# Patient Record
Sex: Female | Born: 1945 | Hispanic: Yes | Marital: Married | Smoking: Never smoker
Health system: Southern US, Community
[De-identification: ages and names within clinical notes are randomized; demographics above are authoritative.]

## PROBLEM LIST (undated history)

## (undated) DIAGNOSIS — C8330 Diffuse large B-cell lymphoma, unspecified site: Secondary | ICD-10-CM

## (undated) DIAGNOSIS — M069 Rheumatoid arthritis, unspecified: Secondary | ICD-10-CM

## (undated) DIAGNOSIS — K219 Gastro-esophageal reflux disease without esophagitis: Secondary | ICD-10-CM

## (undated) DIAGNOSIS — G4700 Insomnia, unspecified: Secondary | ICD-10-CM

## (undated) DIAGNOSIS — M419 Scoliosis, unspecified: Secondary | ICD-10-CM

## (undated) DIAGNOSIS — K279 Peptic ulcer, site unspecified, unspecified as acute or chronic, without hemorrhage or perforation: Secondary | ICD-10-CM

## (undated) DIAGNOSIS — R519 Headache, unspecified: Secondary | ICD-10-CM

## (undated) DIAGNOSIS — I10 Essential (primary) hypertension: Secondary | ICD-10-CM

## (undated) DIAGNOSIS — F419 Anxiety disorder, unspecified: Secondary | ICD-10-CM

## (undated) DIAGNOSIS — G8929 Other chronic pain: Secondary | ICD-10-CM

## (undated) DIAGNOSIS — C439 Malignant melanoma of skin, unspecified: Secondary | ICD-10-CM

## (undated) DIAGNOSIS — M545 Low back pain, unspecified: Secondary | ICD-10-CM

## (undated) DIAGNOSIS — E7800 Pure hypercholesterolemia, unspecified: Secondary | ICD-10-CM

## (undated) DIAGNOSIS — D649 Anemia, unspecified: Secondary | ICD-10-CM

## (undated) DIAGNOSIS — J45909 Unspecified asthma, uncomplicated: Secondary | ICD-10-CM

## (undated) DIAGNOSIS — R51 Headache: Secondary | ICD-10-CM

## (undated) HISTORY — PX: INGUINAL LYMPH NODE BIOPSY: SHX5865

## (undated) HISTORY — PX: AXILLARY LYMPH NODE BIOPSY: SHX5737

## (undated) HISTORY — PX: LYMPH NODE BIOPSY: SHX201

## (undated) HISTORY — PX: TOTAL KNEE ARTHROPLASTY: SHX125

## (undated) HISTORY — PX: APPENDECTOMY: SHX54

## (undated) HISTORY — PX: TOTAL ABDOMINAL HYSTERECTOMY: SHX209

## (undated) HISTORY — PX: JOINT REPLACEMENT: SHX530

---

## 2011-11-23 DIAGNOSIS — C439 Malignant melanoma of skin, unspecified: Secondary | ICD-10-CM

## 2011-11-23 HISTORY — PX: MELANOMA EXCISION: SHX5266

## 2011-11-23 HISTORY — DX: Malignant melanoma of skin, unspecified: C43.9

## 2015-01-17 ENCOUNTER — Other Ambulatory Visit: Payer: Self-pay | Admitting: Internal Medicine

## 2015-01-17 ENCOUNTER — Ambulatory Visit
Admission: RE | Admit: 2015-01-17 | Discharge: 2015-01-17 | Disposition: A | Discharge status: Home / Self Care | Payer: No Typology Code available for payment source | Source: Ambulatory Visit | Attending: Internal Medicine | Admitting: Internal Medicine

## 2015-01-17 DIAGNOSIS — R05 Cough: Secondary | ICD-10-CM

## 2015-01-17 DIAGNOSIS — R059 Cough, unspecified: Secondary | ICD-10-CM

## 2015-01-18 ENCOUNTER — Emergency Department (HOSPITAL_COMMUNITY)
Admission: EM | Admit: 2015-01-18 | Discharge: 2015-01-19 | Disposition: A | Discharge status: Home / Self Care | Payer: Medicare (Managed Care) | Attending: Emergency Medicine | Admitting: Emergency Medicine

## 2015-01-18 ENCOUNTER — Encounter (HOSPITAL_COMMUNITY): Payer: Self-pay | Admitting: Emergency Medicine

## 2015-01-18 DIAGNOSIS — M069 Rheumatoid arthritis, unspecified: Secondary | ICD-10-CM | POA: Diagnosis not present

## 2015-01-18 DIAGNOSIS — Z85820 Personal history of malignant melanoma of skin: Secondary | ICD-10-CM | POA: Diagnosis not present

## 2015-01-18 DIAGNOSIS — I10 Essential (primary) hypertension: Secondary | ICD-10-CM | POA: Diagnosis not present

## 2015-01-18 DIAGNOSIS — Z8669 Personal history of other diseases of the nervous system and sense organs: Secondary | ICD-10-CM | POA: Insufficient documentation

## 2015-01-18 DIAGNOSIS — R05 Cough: Secondary | ICD-10-CM | POA: Diagnosis present

## 2015-01-18 DIAGNOSIS — K219 Gastro-esophageal reflux disease without esophagitis: Secondary | ICD-10-CM | POA: Insufficient documentation

## 2015-01-18 DIAGNOSIS — Z79899 Other long term (current) drug therapy: Secondary | ICD-10-CM | POA: Diagnosis not present

## 2015-01-18 DIAGNOSIS — Z7952 Long term (current) use of systemic steroids: Secondary | ICD-10-CM | POA: Insufficient documentation

## 2015-01-18 DIAGNOSIS — E7800 Pure hypercholesterolemia, unspecified: Secondary | ICD-10-CM | POA: Insufficient documentation

## 2015-01-18 DIAGNOSIS — J45901 Unspecified asthma with (acute) exacerbation: Secondary | ICD-10-CM | POA: Diagnosis not present

## 2015-01-18 DIAGNOSIS — R059 Cough, unspecified: Secondary | ICD-10-CM

## 2015-01-18 HISTORY — DX: Gastro-esophageal reflux disease without esophagitis: K21.9

## 2015-01-18 HISTORY — DX: Essential (primary) hypertension: I10

## 2015-01-18 HISTORY — DX: Malignant melanoma of skin, unspecified: C43.9

## 2015-01-18 HISTORY — DX: Scoliosis, unspecified: M41.9

## 2015-01-18 HISTORY — DX: Rheumatoid arthritis, unspecified: M06.9

## 2015-01-18 HISTORY — DX: Pure hypercholesterolemia, unspecified: E78.00

## 2015-01-18 HISTORY — DX: Insomnia, unspecified: G47.00

## 2015-01-18 HISTORY — DX: Unspecified asthma, uncomplicated: J45.909

## 2015-01-18 LAB — I-STAT CHEM 8, ED
BUN: 20 mg/dL (ref 6–20)
CHLORIDE: 104 mmol/L (ref 101–111)
CREATININE: 0.6 mg/dL (ref 0.44–1.00)
Calcium, Ion: 1.33 mmol/L — ABNORMAL HIGH (ref 1.13–1.30)
GLUCOSE: 90 mg/dL (ref 65–99)
HCT: 42 % (ref 36.0–46.0)
HEMOGLOBIN: 14.3 g/dL (ref 12.0–15.0)
Potassium: 4.1 mmol/L (ref 3.5–5.1)
Sodium: 141 mmol/L (ref 135–145)
TCO2: 26 mmol/L (ref 0–100)

## 2015-01-18 MED ORDER — HYDROCOD POLST-CPM POLST ER 10-8 MG/5ML PO SUER
5.0000 mL | Freq: Two times a day (BID) | ORAL | Status: DC | PRN
Start: 2015-01-18 — End: 2015-01-24

## 2015-01-18 MED ORDER — GUAIFENESIN-CODEINE 100-10 MG/5ML PO SOLN: 10.0000 mL | Freq: Once | ORAL | Status: DC

## 2015-01-18 MED ORDER — IPRATROPIUM-ALBUTEROL 0.5-2.5 (3) MG/3ML IN SOLN
3.0000 mL | Freq: Once | RESPIRATORY_TRACT | Status: AC
  Administered 2015-01-18: 3 mL via RESPIRATORY_TRACT
  Filled 2015-01-18: qty 3

## 2015-01-18 MED ORDER — DIPHENHYDRAMINE HCL 50 MG/ML IJ SOLN: 25.0000 mg | Freq: Once | INTRAMUSCULAR | Status: DC

## 2015-01-18 NOTE — ED Notes (Signed)
MD Zammitt At bedside.

## 2015-01-18 NOTE — Discharge Instructions (Signed)
Follow up to get your ct tomorrow

## 2015-01-18 NOTE — ED Notes (Signed)
Pt and family state that pt has had dry cough x 3 days. Had a chest x-ray yesterday and is scheduled for a CT scan but has been coughing so much and 'miserable' that she had to come in. Alert and oriented.

## 2015-01-19 ENCOUNTER — Other Ambulatory Visit: Payer: Self-pay

## 2015-01-19 ENCOUNTER — Other Ambulatory Visit: Payer: Self-pay | Admitting: Family Medicine

## 2015-01-19 ENCOUNTER — Ambulatory Visit
Admission: RE | Admit: 2015-01-19 | Discharge: 2015-01-19 | Disposition: A | Discharge status: Home / Self Care | Payer: No Typology Code available for payment source | Source: Ambulatory Visit | Attending: Family Medicine | Admitting: Family Medicine

## 2015-01-19 DIAGNOSIS — R222 Localized swelling, mass and lump, trunk: Secondary | ICD-10-CM

## 2015-01-19 DIAGNOSIS — R05 Cough: Secondary | ICD-10-CM | POA: Diagnosis not present

## 2015-01-19 MED ORDER — IOPAMIDOL (ISOVUE-300) INJECTION 61%
75.0000 mL | Freq: Once | INTRAVENOUS | Status: AC | PRN
  Administered 2015-01-19: 75 mL via INTRAVENOUS

## 2015-01-19 NOTE — ED Notes (Signed)
Pt and family upset about not getting answers and ED MD would not discuss results of prior CXR with patient or family. They were advised to follow up with the CT tomorrow. They were wheeled to the lobby by EMT Candice.

## 2015-01-22 NOTE — ED Provider Notes (Signed)
CSN: [id number]     Arrival date & time 01/18/15  1953 History   First MD Initiated Contact with Patient 01/18/15 2048     Chief Complaint  Patient presents with  . Cough     (Consider location/radiation/quality/duration/timing/severity/associated sxs/prior Treatment) Patient is a 69 y.o. female presenting with cough. The history is provided by the patient (The patient states that she continues to cough. She saw her family doctor who did an x-ray did not see pneumonia. He has arranged for her to get a CT scan of her chest to evaluate possible mass. Patient has no fever but continues to cough).  Cough Cough characteristics:  Non-productive Severity:  Moderate Timing:  Constant Progression:  Unable to specify Chronicity:  Recurrent Context: not animal exposure   Relieved by:  Nothing Associated symptoms: no chest pain, no eye discharge, no headaches and no rash     Past Medical History  Diagnosis Date  . Asthma   . Scoliosis   . High cholesterol   . Rheumatoid arthritis ([place])   . Hypertension   . Melanoma ([place])   . Acid reflux   . Insomnia    Past Surgical History  Procedure Laterality Date  . Abdominal hysterectomy    . Appendectomy    . Total knee arthroplasty Bilateral    No family history on file. Social History  Substance Use Topics  . Smoking status: Never Smoker   . Smokeless tobacco: Not on file  . Alcohol Use: No   OB History    No data available     Review of Systems  Constitutional: Negative for appetite change and fatigue.  HENT: Negative for congestion, ear discharge and sinus pressure.   Eyes: Negative for discharge.  Respiratory: Positive for cough.   Cardiovascular: Negative for chest pain.  Gastrointestinal: Negative for abdominal pain and diarrhea.  Genitourinary: Negative for frequency and hematuria.  Musculoskeletal: Negative for back pain.  Skin: Negative for rash.  Neurological: Negative for seizures and headaches.   Psychiatric/Behavioral: Negative for hallucinations.      Allergies  Morphine and related and Norco  Home Medications   Prior to Admission medications   Medication Sig Start Date End Date Taking? Authorizing Provider  acetaminophen (TYLENOL) 325 MG tablet Take 650 mg by mouth every 6 (six) hours as needed for moderate pain.   Yes Historical Provider, MD  alendronate (FOSAMAX) 70 MG tablet Take 70 mg by mouth once a week. Take with a full glass of water on an empty stomach. Take along with methotrexate.   Yes Historical Provider, MD  folic acid (FOLVITE) 1 MG tablet Take 1 mg by mouth daily.   Yes Historical Provider, MD  methotrexate (RHEUMATREX) 2.5 MG tablet Take 15 mg by mouth once a week. Caution:Chemotherapy. Protect from light. Take 6 tablets weekly.   Yes Historical Provider, MD  metoprolol (LOPRESSOR) 50 MG tablet Take 50 mg by mouth 2 (two) times daily.   Yes Historical Provider, MD  predniSONE (DELTASONE) 10 MG tablet Take 10 mg by mouth daily with breakfast.   Yes Historical Provider, MD  ranitidine (ZANTAC) 150 MG tablet Take 150 mg by mouth 2 (two) times daily.   Yes Historical Provider, MD  simvastatin (ZOCOR) 20 MG tablet Take 20 mg by mouth daily.   Yes Historical Provider, MD  Vitamin D, Ergocalciferol, (DRISDOL) 50000 UNITS CAPS capsule Take 50,000 Units by mouth every 30 (thirty) days.   Yes Historical Provider, MD  chlorpheniramine-HYDROcodone Amanda Cockayne PENNKINETIC ER) 10-8 MG/5ML Latanya Presser  Take 5 mLs by mouth every 12 (twelve) hours as needed for cough. 01/18/15   Milton Ferguson, MD   BP 159/75 mmHg  Pulse 66  Temp(Src) 98.5 F (36.9 C) (Oral)  Resp 16  SpO2 95% Physical Exam  Constitutional: She is oriented to person, place, and time. She appears well-developed.  HENT:  Head: Normocephalic.  Eyes: Conjunctivae and EOM are normal. No scleral icterus.  Neck: Neck supple. No thyromegaly present.  Cardiovascular: Normal rate and regular rhythm.  Exam reveals no  gallop and no friction rub.   No murmur heard. Pulmonary/Chest: No stridor. She has wheezes. She has no rales. She exhibits no tenderness.  Abdominal: She exhibits no distension. There is no tenderness. There is no rebound.  Musculoskeletal: Normal range of motion. She exhibits no edema.  Lymphadenopathy:    She has no cervical adenopathy.  Neurological: She is oriented to person, place, and time. She exhibits normal muscle tone. Coordination normal.  Skin: No rash noted. No erythema.  Psychiatric: She has a normal mood and affect. Her behavior is normal.    ED Course  Procedures (including critical care time) Labs Review Labs Reviewed  I-STAT CHEM 8, ED - Abnormal; Notable for the following:    Calcium, Ion 1.33 (*)    All other components within normal limits    Imaging Review No results found. I have personally reviewed and evaluated these images and lab results as part of my medical decision-making.   EKG Interpretation   Date/Time:  Thursday January 18 2015 20:21:11 EDT Ventricular Rate:  61 PR Interval:  136 QRS Duration: 75 QT Interval:  438 QTC Calculation: 441 R Axis:   20 Text Interpretation:  Sinus rhythm Low voltage, extremity and precordial  leads Abnormal T, consider ischemia, diffuse leads ED PHYSICIAN  INTERPRETATION AVAILABLE IN [facility] Confirmed by TEST, Record  ([id number]) on 01/19/2015 6:54:06 AM      MDM   Final diagnoses:  Cough    Cough probably secondary to possible metastases in chest. Patient improved with Tussionex. We'll give prescription for Tussionex. Patient is scheduled to have a CT scan in a few days and will follow up with her PCP    Milton Ferguson, MD 01/22/15 [phone]

## 2015-01-24 ENCOUNTER — Encounter: Payer: Self-pay | Admitting: Cardiothoracic Surgery

## 2015-01-24 ENCOUNTER — Institutional Professional Consult (permissible substitution) (INDEPENDENT_AMBULATORY_CARE_PROVIDER_SITE_OTHER): Payer: Medicare (Managed Care) | Admitting: Cardiothoracic Surgery

## 2015-01-24 ENCOUNTER — Other Ambulatory Visit: Payer: Self-pay | Admitting: *Deleted

## 2015-01-24 VITALS — BP 150/81 | HR 90 | Resp 20 | Ht 64.0 in | Wt 215.0 lb

## 2015-01-24 DIAGNOSIS — I313 Pericardial effusion (noninflammatory): Secondary | ICD-10-CM

## 2015-01-24 DIAGNOSIS — J9859 Other diseases of mediastinum, not elsewhere classified: Secondary | ICD-10-CM

## 2015-01-24 DIAGNOSIS — R918 Other nonspecific abnormal finding of lung field: Secondary | ICD-10-CM

## 2015-01-24 DIAGNOSIS — I3139 Other pericardial effusion (noninflammatory): Secondary | ICD-10-CM

## 2015-01-24 NOTE — Progress Notes (Signed)
PCP is Angelica Pou, MD Referring Provider is Angelica Pou, MD  Chief Complaint  Patient presents with  . Mediastinal Mass    eval for bx..CT CHEST 01/19/15  patient examined and CT scan of chest personally reviewed  HPI: 69 year old obese Filipino female presents with cough, orthopnea and a 12 cm left anterior mediastinal mass on CT scan. Symptoms started approximately 2 weeks ago. Past history significant for melanoma resected from her left forearm and 2013. She did not receive any chemotherapy but it had a lymph node biopsy. In 2014 she had bilateral groin and right neck lymph node biopsy was which were negative-is apparently there is a concern for lymphoma. All her previous surgery was done in [place].  CT scan performed recently shows a large mediastinal mass with compression of the innominate vein and a small-moderate pericardial effusion. There is a small 5 mm pulmonary nodule in the right lower lung field.  The patient has steroid and methotrexate dependent rheumatoid arthritis. She has had bilateral total knee replacements. She walks poorly and uses a walker.   She's had some exposure to smoking the past but remote. She denies any productive cough hemoptysis chest pain. She does have orthopnea. She is past history scoliosis but without surgery. Her last hemoglobin was 12.5 white count 7.2 platelet count 252.   Past Medical History  Diagnosis Date  . Asthma   . Scoliosis   . High cholesterol   . Rheumatoid arthritis ([place])   . Hypertension   . Acid reflux   . Insomnia   . Melanoma [facility])     Past Surgical History  Procedure Laterality Date  . Abdominal hysterectomy    . Appendectomy    . Total knee arthroplasty Bilateral     Family History  Problem Relation Age of Onset  . Cancer Sister 81    BREAST    Social History Social History  Substance Use Topics  . Smoking status: Never Smoker   . Smokeless tobacco: None  . Alcohol Use: No     Current Outpatient Prescriptions  Medication Sig Dispense Refill  . acetaminophen (TYLENOL) 325 MG tablet Take 650 mg by mouth every 6 (six) hours as needed for moderate pain.    Marland Kitchen albuterol (PROVENTIL) (5 MG/ML) 0.5% nebulizer solution Take 2.5 mg by nebulization every 6 (six) hours as needed for wheezing or shortness of breath.    Marland Kitchen alendronate (FOSAMAX) 70 MG tablet Take 70 mg by mouth once a week. Take with a full glass of water on an empty stomach. Take along with methotrexate.    . folic acid (FOLVITE) 1 MG tablet Take 1 mg by mouth daily.    . methotrexate (RHEUMATREX) 2.5 MG tablet Take 15 mg by mouth once a week. Caution:Chemotherapy. Protect from light. Take 6 tablets weekly.    . metoprolol (LOPRESSOR) 50 MG tablet Take 50 mg by mouth 2 (two) times daily.    . predniSONE (DELTASONE) 10 MG tablet Take 10 mg by mouth daily with breakfast.    . ranitidine (ZANTAC) 150 MG tablet Take 150 mg by mouth 2 (two) times daily.    . simvastatin (ZOCOR) 20 MG tablet Take 20 mg by mouth daily.    . Vitamin D, Ergocalciferol, (DRISDOL) 50000 UNITS CAPS capsule Take 50,000 Units by mouth every 30 (thirty) days.    . benzonatate (TESSALON) 100 MG capsule TAKE ONE CAPSULE BY MOUTH 3 TIMES A DAY (NOT COVERED BY INSURANCE)  1   No current facility-administered  medications for this visit.    Allergies  Allergen Reactions  . Morphine And Related Hives and Itching  . Norco [Hydrocodone-Acetaminophen] Hives and Itching    Review of Systems         Review of Systems :  [ y ] = yes, [  ] = no        General :  Weight gain [  yes ]    Weight loss  [   ]  Fatigue [place].Blacker  ]  Fever [no  ]  Chills  [ no ]  No night sweats                              Weakness yes [  yes]           Cardiac :  Chest pain/ pressure [place].Blacker  ]  Resting SOB [  ] exertional SOB [place].Blacker  ]                        Orthopnea [  ]  Pedal edema  [  ]  Palpitations [  ] Syncope/presyncope [ ]                         Paroxysmal  nocturnal dyspnea [  ]        Pulmonary : cough [ yes ]  wheezing [place].Blacker  ]  Hemoptysis [  ] Sputum [  ] Snoring [  ]                              Pneumothorax [  ]  Sleep apnea [  ]       GI : Vomiting [  ]  Dysphagia [no  ]  Melena  [  ]  Abdominal pain [  ] BRBPR [  ]              Heart burn [  ]  Constipation [  ] Diarrhea  [  ] Colonoscopy [  ]       GU : Hematuria [  ]  Dysuria [  ]  Nocturia [  ] UTI's [  ]       Vascular : Claudication [  ]  Rest pain [  ]  DVT [  ] Vein stripping [  ] leg ulcers [  ]                          TIA [  ] Stroke [  ]  Varicose veins [  ]       NEURO :  Headaches  [  ] Seizures [  ] Vision changes [  ] Paresthesias [  ]       Musculoskeletal :  Arthritis [  ] Gout  [  ]  Back pain [ yes ]  Joint pain [place].Blacker  ]       Skin :  Rash [  ]  Melanoma [  ]        Heme : Bleeding problems [  ]Clotting Disorders [  ] Anemia [  ]Blood Transfusion [ ]        Endocrine : Diabetes [no  ] Thyroid Disorder  [  ]       Psych : Depression [  ]  Anxiety [ yes ]  Psych hospitalizations [  ]         Right-hand dominant    Negative for history previous thoracic trauma or pneumonia                                      BP 150/81 mmHg  Pulse 90  Resp 20  Ht 5\' 4"  (1.626 m)  Wt 215 lb (97.523 kg)  BMI 36.89 kg/m2  SpO2 96% Physical Exam      Physical Exam  General: Obese Asian female with cough and some difficulty breathing because of coughing but with normal room air saturation HEENT: Normocephalic pupils equal , dentition adequate Neck: Supple without JVD, adenopathy, or bruit Chest: Clear to auscultation, symmetrical breath sounds, no rhonchi, no tenderness             or deformity Cardiovascular: Regular rate and rhythm, no murmur,no rub, no gallop, peripheral pulses             palpable in all extremities Abdomen:  Soft, obese, nontender, no palpable mass or organomegaly Extremities: Warm, well-perfused, no clubbing cyanosis edema or tenderness,              no  venous stasis changes of the legs Rectal/GU: Deferred Neuro: Grossly non--focal and symmetrical throughout Skin: Clean and dry without rash or ulceration   Diagnostic Tests: CT scan personally reviewed, counseled with patient and family  Impression: Large mediastinal tumor, 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 This appears non-resectable. She is a very poor candidate for general anesthesia because of her respiratory symptoms, obesity, and poor mobility. The mass does not appear resectable.Will plan on interventional radiology for needle biopsy of this large mediastinal mass then referral to oncology for possible radiation/chemotherapy  Mild-moderate pericardial effusion noted on CT scan  Plan:proceed with IR CT guided core needle biopsy of a large mediastinal mass           2-D echocardiogram to assess pericardial effusion noted on CT scan   Len Childs, MD [facility] [phone]

## 2015-01-25 ENCOUNTER — Inpatient Hospital Stay (HOSPITAL_COMMUNITY): Payer: Medicare (Managed Care)

## 2015-01-25 ENCOUNTER — Other Ambulatory Visit: Payer: Self-pay | Admitting: *Deleted

## 2015-01-25 ENCOUNTER — Ambulatory Visit (HOSPITAL_BASED_OUTPATIENT_CLINIC_OR_DEPARTMENT_OTHER)
Admission: RE | Admit: 2015-01-25 | Discharge: 2015-01-25 | Disposition: A | Discharge status: Home / Self Care | Payer: Medicare (Managed Care) | Source: Ambulatory Visit | Attending: Cardiothoracic Surgery | Admitting: Cardiothoracic Surgery

## 2015-01-25 ENCOUNTER — Encounter (HOSPITAL_COMMUNITY): Payer: Self-pay | Admitting: General Practice

## 2015-01-25 ENCOUNTER — Inpatient Hospital Stay (HOSPITAL_COMMUNITY)
Admission: AD | Admit: 2015-01-25 | Discharge: 2015-02-22 | DRG: 003 | Disposition: E | Payer: Medicare (Managed Care) | Source: Ambulatory Visit | Attending: Internal Medicine | Admitting: Internal Medicine

## 2015-01-25 DIAGNOSIS — J90 Pleural effusion, not elsewhere classified: Secondary | ICD-10-CM

## 2015-01-25 DIAGNOSIS — I3139 Other pericardial effusion (noninflammatory): Secondary | ICD-10-CM

## 2015-01-25 DIAGNOSIS — F419 Anxiety disorder, unspecified: Secondary | ICD-10-CM | POA: Diagnosis present

## 2015-01-25 DIAGNOSIS — I314 Cardiac tamponade: Secondary | ICD-10-CM | POA: Diagnosis present

## 2015-01-25 DIAGNOSIS — Z8711 Personal history of peptic ulcer disease: Secondary | ICD-10-CM | POA: Diagnosis not present

## 2015-01-25 DIAGNOSIS — J95851 Ventilator associated pneumonia: Secondary | ICD-10-CM | POA: Diagnosis present

## 2015-01-25 DIAGNOSIS — R609 Edema, unspecified: Secondary | ICD-10-CM

## 2015-01-25 DIAGNOSIS — Y95 Nosocomial condition: Secondary | ICD-10-CM | POA: Diagnosis present

## 2015-01-25 DIAGNOSIS — G4730 Sleep apnea, unspecified: Secondary | ICD-10-CM | POA: Diagnosis present

## 2015-01-25 DIAGNOSIS — E871 Hypo-osmolality and hyponatremia: Secondary | ICD-10-CM | POA: Diagnosis not present

## 2015-01-25 DIAGNOSIS — J9690 Respiratory failure, unspecified, unspecified whether with hypoxia or hypercapnia: Secondary | ICD-10-CM | POA: Diagnosis not present

## 2015-01-25 DIAGNOSIS — Z66 Do not resuscitate: Secondary | ICD-10-CM | POA: Diagnosis not present

## 2015-01-25 DIAGNOSIS — E861 Hypovolemia: Secondary | ICD-10-CM | POA: Diagnosis not present

## 2015-01-25 DIAGNOSIS — J9601 Acute respiratory failure with hypoxia: Secondary | ICD-10-CM

## 2015-01-25 DIAGNOSIS — I10 Essential (primary) hypertension: Secondary | ICD-10-CM

## 2015-01-25 DIAGNOSIS — K567 Ileus, unspecified: Secondary | ICD-10-CM | POA: Diagnosis not present

## 2015-01-25 DIAGNOSIS — Z96653 Presence of artificial knee joint, bilateral: Secondary | ICD-10-CM | POA: Diagnosis present

## 2015-01-25 DIAGNOSIS — R109 Unspecified abdominal pain: Secondary | ICD-10-CM

## 2015-01-25 DIAGNOSIS — M7981 Nontraumatic hematoma of soft tissue: Secondary | ICD-10-CM | POA: Diagnosis not present

## 2015-01-25 DIAGNOSIS — R2232 Localized swelling, mass and lump, left upper limb: Secondary | ICD-10-CM | POA: Diagnosis not present

## 2015-01-25 DIAGNOSIS — I82C12 Acute embolism and thrombosis of left internal jugular vein: Secondary | ICD-10-CM | POA: Diagnosis present

## 2015-01-25 DIAGNOSIS — G893 Neoplasm related pain (acute) (chronic): Secondary | ICD-10-CM | POA: Insufficient documentation

## 2015-01-25 DIAGNOSIS — Z79899 Other long term (current) drug therapy: Secondary | ICD-10-CM | POA: Diagnosis not present

## 2015-01-25 DIAGNOSIS — E876 Hypokalemia: Secondary | ICD-10-CM | POA: Diagnosis not present

## 2015-01-25 DIAGNOSIS — E8809 Other disorders of plasma-protein metabolism, not elsewhere classified: Secondary | ICD-10-CM | POA: Diagnosis present

## 2015-01-25 DIAGNOSIS — J9600 Acute respiratory failure, unspecified whether with hypoxia or hypercapnia: Secondary | ICD-10-CM

## 2015-01-25 DIAGNOSIS — Z90710 Acquired absence of both cervix and uterus: Secondary | ICD-10-CM | POA: Diagnosis not present

## 2015-01-25 DIAGNOSIS — J939 Pneumothorax, unspecified: Secondary | ICD-10-CM

## 2015-01-25 DIAGNOSIS — D649 Anemia, unspecified: Secondary | ICD-10-CM | POA: Insufficient documentation

## 2015-01-25 DIAGNOSIS — K5900 Constipation, unspecified: Secondary | ICD-10-CM

## 2015-01-25 DIAGNOSIS — I319 Disease of pericardium, unspecified: Secondary | ICD-10-CM | POA: Diagnosis not present

## 2015-01-25 DIAGNOSIS — J9859 Other diseases of mediastinum, not elsewhere classified: Secondary | ICD-10-CM | POA: Diagnosis not present

## 2015-01-25 DIAGNOSIS — R51 Headache: Secondary | ICD-10-CM | POA: Diagnosis present

## 2015-01-25 DIAGNOSIS — M7989 Other specified soft tissue disorders: Secondary | ICD-10-CM

## 2015-01-25 DIAGNOSIS — M419 Scoliosis, unspecified: Secondary | ICD-10-CM | POA: Diagnosis present

## 2015-01-25 DIAGNOSIS — Z803 Family history of malignant neoplasm of breast: Secondary | ICD-10-CM | POA: Diagnosis not present

## 2015-01-25 DIAGNOSIS — T80219A Unspecified infection due to central venous catheter, initial encounter: Secondary | ICD-10-CM

## 2015-01-25 DIAGNOSIS — T814XXA Infection following a procedure, initial encounter: Secondary | ICD-10-CM | POA: Diagnosis not present

## 2015-01-25 DIAGNOSIS — E162 Hypoglycemia, unspecified: Secondary | ICD-10-CM | POA: Diagnosis not present

## 2015-01-25 DIAGNOSIS — R740 Nonspecific elevation of levels of transaminase and lactic acid dehydrogenase [LDH]: Secondary | ICD-10-CM | POA: Diagnosis not present

## 2015-01-25 DIAGNOSIS — D701 Agranulocytosis secondary to cancer chemotherapy: Secondary | ICD-10-CM | POA: Diagnosis present

## 2015-01-25 DIAGNOSIS — I313 Pericardial effusion (noninflammatory): Secondary | ICD-10-CM | POA: Diagnosis not present

## 2015-01-25 DIAGNOSIS — Z930 Tracheostomy status: Secondary | ICD-10-CM | POA: Diagnosis not present

## 2015-01-25 DIAGNOSIS — K3184 Gastroparesis: Secondary | ICD-10-CM | POA: Diagnosis not present

## 2015-01-25 DIAGNOSIS — I82622 Acute embolism and thrombosis of deep veins of left upper extremity: Secondary | ICD-10-CM | POA: Diagnosis present

## 2015-01-25 DIAGNOSIS — Z7952 Long term (current) use of systemic steroids: Secondary | ICD-10-CM

## 2015-01-25 DIAGNOSIS — J189 Pneumonia, unspecified organism: Secondary | ICD-10-CM | POA: Diagnosis present

## 2015-01-25 DIAGNOSIS — I82409 Acute embolism and thrombosis of unspecified deep veins of unspecified lower extremity: Secondary | ICD-10-CM | POA: Diagnosis not present

## 2015-01-25 DIAGNOSIS — J40 Bronchitis, not specified as acute or chronic: Secondary | ICD-10-CM | POA: Diagnosis not present

## 2015-01-25 DIAGNOSIS — R739 Hyperglycemia, unspecified: Secondary | ICD-10-CM | POA: Diagnosis present

## 2015-01-25 DIAGNOSIS — R8299 Other abnormal findings in urine: Secondary | ICD-10-CM | POA: Diagnosis not present

## 2015-01-25 DIAGNOSIS — C8332 Diffuse large B-cell lymphoma, intrathoracic lymph nodes: Principal | ICD-10-CM | POA: Diagnosis present

## 2015-01-25 DIAGNOSIS — Z9911 Dependence on respirator [ventilator] status: Secondary | ICD-10-CM

## 2015-01-25 DIAGNOSIS — Z9289 Personal history of other medical treatment: Secondary | ICD-10-CM

## 2015-01-25 DIAGNOSIS — T380X5A Adverse effect of glucocorticoids and synthetic analogues, initial encounter: Secondary | ICD-10-CM | POA: Diagnosis present

## 2015-01-25 DIAGNOSIS — I871 Compression of vein: Secondary | ICD-10-CM | POA: Diagnosis present

## 2015-01-25 DIAGNOSIS — E870 Hyperosmolality and hypernatremia: Secondary | ICD-10-CM | POA: Diagnosis not present

## 2015-01-25 DIAGNOSIS — Z885 Allergy status to narcotic agent status: Secondary | ICD-10-CM

## 2015-01-25 DIAGNOSIS — Z5111 Encounter for antineoplastic chemotherapy: Secondary | ICD-10-CM | POA: Insufficient documentation

## 2015-01-25 DIAGNOSIS — D702 Other drug-induced agranulocytosis: Secondary | ICD-10-CM | POA: Diagnosis not present

## 2015-01-25 DIAGNOSIS — G9340 Encephalopathy, unspecified: Secondary | ICD-10-CM | POA: Diagnosis not present

## 2015-01-25 DIAGNOSIS — J9811 Atelectasis: Secondary | ICD-10-CM | POA: Diagnosis not present

## 2015-01-25 DIAGNOSIS — R509 Fever, unspecified: Secondary | ICD-10-CM

## 2015-01-25 DIAGNOSIS — M069 Rheumatoid arthritis, unspecified: Secondary | ICD-10-CM | POA: Diagnosis present

## 2015-01-25 DIAGNOSIS — Z85820 Personal history of malignant melanoma of skin: Secondary | ICD-10-CM | POA: Diagnosis not present

## 2015-01-25 DIAGNOSIS — J9620 Acute and chronic respiratory failure, unspecified whether with hypoxia or hypercapnia: Secondary | ICD-10-CM

## 2015-01-25 DIAGNOSIS — G4700 Insomnia, unspecified: Secondary | ICD-10-CM | POA: Diagnosis present

## 2015-01-25 DIAGNOSIS — R451 Restlessness and agitation: Secondary | ICD-10-CM | POA: Diagnosis not present

## 2015-01-25 DIAGNOSIS — K219 Gastro-esophageal reflux disease without esophagitis: Secondary | ICD-10-CM | POA: Diagnosis present

## 2015-01-25 DIAGNOSIS — M545 Low back pain: Secondary | ICD-10-CM | POA: Diagnosis present

## 2015-01-25 DIAGNOSIS — E7800 Pure hypercholesterolemia, unspecified: Secondary | ICD-10-CM | POA: Diagnosis present

## 2015-01-25 DIAGNOSIS — R578 Other shock: Secondary | ICD-10-CM | POA: Diagnosis present

## 2015-01-25 DIAGNOSIS — T451X5A Adverse effect of antineoplastic and immunosuppressive drugs, initial encounter: Secondary | ICD-10-CM | POA: Diagnosis present

## 2015-01-25 DIAGNOSIS — I82B12 Acute embolism and thrombosis of left subclavian vein: Secondary | ICD-10-CM | POA: Diagnosis present

## 2015-01-25 DIAGNOSIS — R918 Other nonspecific abnormal finding of lung field: Secondary | ICD-10-CM | POA: Diagnosis not present

## 2015-01-25 DIAGNOSIS — C8338 Diffuse large B-cell lymphoma, lymph nodes of multiple sites: Secondary | ICD-10-CM | POA: Diagnosis not present

## 2015-01-25 DIAGNOSIS — Z0189 Encounter for other specified special examinations: Secondary | ICD-10-CM

## 2015-01-25 DIAGNOSIS — R0602 Shortness of breath: Secondary | ICD-10-CM

## 2015-01-25 DIAGNOSIS — N179 Acute kidney failure, unspecified: Secondary | ICD-10-CM | POA: Diagnosis not present

## 2015-01-25 DIAGNOSIS — R0902 Hypoxemia: Secondary | ICD-10-CM

## 2015-01-25 DIAGNOSIS — R911 Solitary pulmonary nodule: Secondary | ICD-10-CM | POA: Diagnosis present

## 2015-01-25 DIAGNOSIS — R222 Localized swelling, mass and lump, trunk: Secondary | ICD-10-CM | POA: Diagnosis not present

## 2015-01-25 DIAGNOSIS — D6489 Other specified anemias: Secondary | ICD-10-CM | POA: Diagnosis present

## 2015-01-25 DIAGNOSIS — R5081 Fever presenting with conditions classified elsewhere: Secondary | ICD-10-CM | POA: Diagnosis not present

## 2015-01-25 DIAGNOSIS — D150 Benign neoplasm of thymus: Secondary | ICD-10-CM | POA: Diagnosis present

## 2015-01-25 DIAGNOSIS — D61810 Antineoplastic chemotherapy induced pancytopenia: Secondary | ICD-10-CM | POA: Diagnosis not present

## 2015-01-25 DIAGNOSIS — I82402 Acute embolism and thrombosis of unspecified deep veins of left lower extremity: Secondary | ICD-10-CM | POA: Diagnosis not present

## 2015-01-25 DIAGNOSIS — E785 Hyperlipidemia, unspecified: Secondary | ICD-10-CM | POA: Diagnosis present

## 2015-01-25 DIAGNOSIS — I471 Supraventricular tachycardia: Secondary | ICD-10-CM | POA: Diagnosis not present

## 2015-01-25 DIAGNOSIS — T368X5A Adverse effect of other systemic antibiotics, initial encounter: Secondary | ICD-10-CM | POA: Diagnosis not present

## 2015-01-25 DIAGNOSIS — R140 Abdominal distension (gaseous): Secondary | ICD-10-CM

## 2015-01-25 DIAGNOSIS — R4182 Altered mental status, unspecified: Secondary | ICD-10-CM

## 2015-01-25 DIAGNOSIS — Z978 Presence of other specified devices: Secondary | ICD-10-CM

## 2015-01-25 DIAGNOSIS — J9621 Acute and chronic respiratory failure with hypoxia: Secondary | ICD-10-CM | POA: Diagnosis not present

## 2015-01-25 DIAGNOSIS — E883 Tumor lysis syndrome: Secondary | ICD-10-CM | POA: Diagnosis not present

## 2015-01-25 DIAGNOSIS — E873 Alkalosis: Secondary | ICD-10-CM | POA: Diagnosis not present

## 2015-01-25 DIAGNOSIS — Z6836 Body mass index (BMI) 36.0-36.9, adult: Secondary | ICD-10-CM | POA: Diagnosis not present

## 2015-01-25 DIAGNOSIS — C8330 Diffuse large B-cell lymphoma, unspecified site: Secondary | ICD-10-CM

## 2015-01-25 DIAGNOSIS — R1110 Vomiting, unspecified: Secondary | ICD-10-CM

## 2015-01-25 HISTORY — DX: Anxiety disorder, unspecified: F41.9

## 2015-01-25 HISTORY — DX: Peptic ulcer, site unspecified, unspecified as acute or chronic, without hemorrhage or perforation: K27.9

## 2015-01-25 HISTORY — DX: Anemia, unspecified: D64.9

## 2015-01-25 HISTORY — DX: Headache, unspecified: R51.9

## 2015-01-25 HISTORY — DX: Low back pain: M54.5

## 2015-01-25 HISTORY — DX: Low back pain, unspecified: M54.50

## 2015-01-25 HISTORY — DX: Headache: R51

## 2015-01-25 HISTORY — DX: Diffuse large B-cell lymphoma, unspecified site: C83.30

## 2015-01-25 HISTORY — DX: Other chronic pain: G89.29

## 2015-01-25 LAB — BLOOD GAS, ARTERIAL
ACID-BASE DEFICIT: 0.6 mmol/L (ref 0.0–2.0)
BICARBONATE: 23.9 meq/L (ref 20.0–24.0)
DRAWN BY: 225631
O2 Content: 3 L/min
O2 SAT: 97.3 %
PATIENT TEMPERATURE: 98.6
PO2 ART: 98 mmHg (ref 80.0–100.0)
TCO2: 25.2 mmol/L (ref 0–100)
pCO2 arterial: 42 mmHg (ref 35.0–45.0)
pH, Arterial: 7.374 (ref 7.350–7.450)

## 2015-01-25 LAB — CBC
HCT: 39.5 % (ref 36.0–46.0)
Hemoglobin: 12.4 g/dL (ref 12.0–15.0)
MCH: 31.4 pg (ref 26.0–34.0)
MCHC: 31.4 g/dL (ref 30.0–36.0)
MCV: 100 fL (ref 78.0–100.0)
PLATELETS: 260 10*3/uL (ref 150–400)
RBC: 3.95 MIL/uL (ref 3.87–5.11)
RDW: 15 % (ref 11.5–15.5)
WBC: 6.2 10*3/uL (ref 4.0–10.5)

## 2015-01-25 LAB — COMPREHENSIVE METABOLIC PANEL
ALT: 70 U/L — AB (ref 14–54)
AST: 34 U/L (ref 15–41)
Albumin: 3.4 g/dL — ABNORMAL LOW (ref 3.5–5.0)
Alkaline Phosphatase: 53 U/L (ref 38–126)
Anion gap: 7 (ref 5–15)
BILIRUBIN TOTAL: 0.9 mg/dL (ref 0.3–1.2)
BUN: 19 mg/dL (ref 6–20)
CALCIUM: 10.8 mg/dL — AB (ref 8.9–10.3)
CHLORIDE: 107 mmol/L (ref 101–111)
CO2: 26 mmol/L (ref 22–32)
CREATININE: 0.61 mg/dL (ref 0.44–1.00)
Glucose, Bld: 105 mg/dL — ABNORMAL HIGH (ref 65–99)
Potassium: 4.1 mmol/L (ref 3.5–5.1)
Sodium: 140 mmol/L (ref 135–145)
TOTAL PROTEIN: 5.8 g/dL — AB (ref 6.5–8.1)

## 2015-01-25 LAB — PROTIME-INR
INR: 1.18 (ref 0.00–1.49)
PROTHROMBIN TIME: 15.2 s (ref 11.6–15.2)

## 2015-01-25 LAB — D-DIMER, QUANTITATIVE (NOT AT ARMC): D DIMER QUANT: 1.3 ug{FEU}/mL — AB (ref 0.00–0.48)

## 2015-01-25 LAB — MRSA PCR SCREENING: MRSA BY PCR: NEGATIVE

## 2015-01-25 MED ORDER — LORAZEPAM 2 MG/ML IJ SOLN
1.0000 mg | Freq: Once | INTRAMUSCULAR | Status: AC
  Administered 2015-01-25: 1 mg via INTRAVENOUS
  Filled 2015-01-25: qty 1

## 2015-01-25 MED ORDER — SIMVASTATIN 40 MG PO TABS: 20.0000 mg | ORAL_TABLET | Freq: Every day | ORAL | Status: DC

## 2015-01-25 MED ORDER — ALBUTEROL SULFATE (2.5 MG/3ML) 0.083% IN NEBU: 2.5000 mg | INHALATION_SOLUTION | Freq: Four times a day (QID) | RESPIRATORY_TRACT | Status: DC | PRN

## 2015-01-25 MED ORDER — SODIUM CHLORIDE 0.9 % IV BOLUS (SEPSIS)
500.0000 mL | Freq: Once | INTRAVENOUS | Status: AC
  Administered 2015-01-25: 500 mL via INTRAVENOUS

## 2015-01-25 MED ORDER — FOLIC ACID 1 MG PO TABS
1.0000 mg | ORAL_TABLET | Freq: Every day | ORAL | Status: DC
  Administered 2015-01-25: 1 mg via ORAL
  Filled 2015-01-25: qty 1

## 2015-01-25 MED ORDER — PREDNISONE 10 MG PO TABS: 10.0000 mg | ORAL_TABLET | Freq: Every day | ORAL | Status: DC

## 2015-01-25 MED ORDER — BENZONATATE 100 MG PO CAPS: 100.0000 mg | ORAL_CAPSULE | Freq: Three times a day (TID) | ORAL | Status: DC | PRN

## 2015-01-25 MED ORDER — FAMOTIDINE 20 MG PO TABS
10.0000 mg | ORAL_TABLET | Freq: Two times a day (BID) | ORAL | Status: DC
  Administered 2015-01-25: 10 mg via ORAL
  Filled 2015-01-25: qty 1

## 2015-01-25 MED ORDER — ACETAMINOPHEN 325 MG PO TABS: 650.0000 mg | ORAL_TABLET | Freq: Four times a day (QID) | ORAL | Status: DC | PRN

## 2015-01-25 NOTE — Progress Notes (Signed)
  Echocardiogram 2D Echocardiogram has been performed.  Maria Bauer 02/16/2015, 2:51 PM

## 2015-01-25 NOTE — Progress Notes (Signed)
*  PRELIMINARY RESULTS* Vascular Ultrasound Lower extremity venous duplex has been completed.  Preliminary findings: No evidence of DVT or baker's cyst.   Landry Mellow, RDMS, RVT  02/08/2015, 7:25 PM

## 2015-01-25 NOTE — H&P (Signed)
History and Physical    Maria Bauer JAS:[id number] DOB: 03/11/1946 DOA: 01/28/2015  Referring physician: Dr. Lawson Fiscal PCP: Angelica Pou, MD  Specialists: cardiology, cardiothoracic surgery, PCCM  Chief Complaint: shortness of breath   HPI: Maria Bauer is a 69 y.o. female has a past medical history significant for HTN, RA, prior history of melanoma s/p resection, in remission, is being admitted from echo due to large pericardial effusion. Over the past 1-2 weeks patient has been complaining of a cough and progressive shortness of breath, she underwent a CT scan of her chest as an outpatient on 1028 which showed a mediastinal mass. She was referred to Dr. Nils Pyle with [facility] whom she saw yesterday, and he felt like the mass is not resectable and likely needs biopsy. Given pericardial effusion she had a echo today which showed findings concerning for impending tamponade and patient was direct admitted to SDU. She endorses dyspnea and a persistent cough, she denies chest pain, denies palpitations. She denies any nausea/vomiting, denies abdominal pain. She has no lightheadedness or dizziness. She denies fever and chills.   CT Chest 10/28 12.0 x 8.8 cm abnormal soft tissue density seen in anterior mediastinum concerning for adenopathy related to lymphoma or other metastatic disease. Some compression of the left innominate vein is noted as a result. Moderate pericardial effusion is noted as well. 5 mm nodule is noted posteriorly in the right lower lobe. This may represent metastatic focus given above finding. Alternatively, it may be unrelated. Continued CT follow-up is recommended to ensure stability.  2D echo 11/3 Study Conclusions - Left ventricle: The cavity size was normal. Wall thickness was normal. Systolic function was normal. The estimated ejection fraction was in the range of 55% to 60%.  Pericardium, extracardiac: Large pericardial effusion surrounds heart. There is some nodularity  along epicardial surface,question tumor?. Effusion measures 30 mm in maximal dimension.There is RV indentation in diastole. There is mild mitral inflowvariation. IVC is dilated with blunted respiratory variation. Findings support impending tamponade. Clinical correlation indicated.  Review of Systems: as per HPI otherwise 10 point ROS negative.   Past Medical History  Diagnosis Date  . Asthma   . Scoliosis   . High cholesterol   . Rheumatoid arthritis ([place])   . Hypertension   . Acid reflux   . Insomnia   . Melanoma [facility])    Past Surgical History  Procedure Laterality Date  . Abdominal hysterectomy    . Appendectomy    . Total knee arthroplasty Bilateral    Social History:  reports that she has never smoked. She does not have any smokeless tobacco history on file. She reports that she does not drink alcohol or use illicit drugs.  Allergies  Allergen Reactions  . Morphine And Related Hives and Itching  . Norco [Hydrocodone-Acetaminophen] Hives and Itching    Family History  Problem Relation Age of Onset  . Cancer Sister 42    BREAST   Prior to Admission medications   Medication Sig Start Date End Date Taking? Authorizing Provider  acetaminophen (TYLENOL) 325 MG tablet Take 650 mg by mouth every 6 (six) hours as needed for moderate pain.    Historical Provider, MD  albuterol (PROVENTIL) (5 MG/ML) 0.5% nebulizer solution Take 2.5 mg by nebulization every 6 (six) hours as needed for wheezing or shortness of breath.    Historical Provider, MD  alendronate (FOSAMAX) 70 MG tablet Take 70 mg by mouth once a week. Take with a full glass of water on  an empty stomach. Take along with methotrexate.    Historical Provider, MD  benzonatate (TESSALON) 100 MG capsule TAKE ONE CAPSULE BY MOUTH 3 TIMES A DAY (NOT COVERED BY INSURANCE) 01/19/15   Historical Provider, MD  folic acid (FOLVITE) 1 MG tablet Take 1 mg by mouth daily.    Historical Provider, MD  methotrexate (RHEUMATREX) 2.5 MG  tablet Take 15 mg by mouth once a week. Caution:Chemotherapy. Protect from light. Take 6 tablets weekly.    Historical Provider, MD  metoprolol (LOPRESSOR) 50 MG tablet Take 50 mg by mouth 2 (two) times daily.    Historical Provider, MD  predniSONE (DELTASONE) 10 MG tablet Take 10 mg by mouth daily with breakfast.    Historical Provider, MD  ranitidine (ZANTAC) 150 MG tablet Take 150 mg by mouth 2 (two) times daily.    Historical Provider, MD  simvastatin (ZOCOR) 20 MG tablet Take 20 mg by mouth daily.    Historical Provider, MD  Vitamin D, Ergocalciferol, (DRISDOL) 50000 UNITS CAPS capsule Take 50,000 Units by mouth every 30 (thirty) days.    Historical Provider, MD   Physical Exam: Filed Vitals:   01/24/2015 1621  BP: 156/97  Pulse: 72  Temp: 98 F (36.7 C)  TempSrc: Oral  Resp: 23  Height: 5\' 4"  (1.626 m)  Weight: 96.9 kg (213 lb 10 oz)  SpO2: 98%     GENERAL: in distress, breathing fast  HEENT: head NCAT, no scleral icterus. Pupils round and reactive. Mucous membranes are moist.   NECK: Supple. No carotid bruits. No lymphadenopathy or thyromegaly.  LUNGS: Clear to auscultation. No wheezing or crackles  HEART: Regular rate and rhythm without murmur. 2+ pulses, pulsus paradoxus present, no peripheral edema  ABDOMEN: Soft, nontender, and nondistended. Positive bowel sounds.   EXTREMITIES: Without any cyanosis, clubbing, rash, lesions or edema.  NEUROLOGIC: Alert and oriented x3. Cranial nerves II through XII are grossly intact. Strength 5/5 in all 4.  PSYCHIATRIC: anxious  SKIN: No ulceration or induration present. Scar left forearm present  Labs on Admission:  Currently labs are pending  Basic Metabolic Panel:  Recent Labs Lab 01/18/15 2148  NA 141  K 4.1  CL 104  GLUCOSE 90  BUN 20  CREATININE 0.60   CBC:  Recent Labs Lab 01/18/15 2148  HGB 14.3  HCT 42.0   EKG: pending. Will review once done.  Assessment/Plan Active Problems:   Pericardial  effusion   Mediastinal mass   HTN (hypertension)   Rheumatoid arthritis (HCC)   Acute respiratory failure with hypoxia (H[facility])   Pericardial effusion with cardiac tamponade   Cardiac tamponade   HLD (hyperlipidemia)   Pericardial effusion with impending tamponade - cardiology consulted, patient to have planned pericardiocentesis. Possibly malignant. - closely monitor overnight in SDU for hypotension or evidence of decompensation, low threshold for ICU transfer. PCCM consulted and following - called cardiology   Acute respiratory failure with hypoxia - supplemental oxygen as needed - Full code - discussed with Dr. Lynford Citizen  Mediastinal mass - likely need CT guided biopsy  History of bilateral knee replacement - poor functional status at baseline - PT once acute issues resolve  RA - continue steroids - hold methotrexate  HTN - hold metoprolol for now  HLD - continue statin   Diet: NPO Fluids: none DVT Prophylaxis: SCD  Code Status: Full  Family Communication: d/w daughter bedside  Disposition Plan: admit to SDU    Maria Blanda M. Cruzita Lederer, MD [facility] Pager [phone]  If 7PM-7AM,  please contact night-coverage [email or web address] Password [facility] 02/11/2015, 4:57 PM

## 2015-01-25 NOTE — Consult Note (Signed)
Primary Physician: Primary Cardiologist:  New  Asked to see re pericardial effusion.    HPI: patinet is a 69 yo who was seen by P VanTrigt yesterday   She has a history of a large anterior mediastinal mass on CT with moderate pericardial effusion on CT  Sent for echo today. She denies CP  Does have SOB and cough  Tired.   Sats on arrival to echo lab 89% on RA.         Past Medical History  Diagnosis Date  . Asthma   . Scoliosis   . High cholesterol   . Rheumatoid arthritis ([place])   . Hypertension   . Acid reflux   . Insomnia   . Melanoma ([place])     Medications Prior to Admission  Medication Sig Dispense Refill  . acetaminophen (TYLENOL) 325 MG tablet Take 650 mg by mouth every 6 (six) hours as needed for moderate pain.    Marland Kitchen albuterol (PROVENTIL) (5 MG/ML) 0.5% nebulizer solution Take 2.5 mg by nebulization every 6 (six) hours as needed for wheezing or shortness of breath.    Marland Kitchen alendronate (FOSAMAX) 70 MG tablet Take 70 mg by mouth once a week. Take with a full glass of water on an empty stomach. Take along with methotrexate.    . benzonatate (TESSALON) 100 MG capsule TAKE ONE CAPSULE BY MOUTH 3 TIMES A DAY (NOT COVERED BY INSURANCE)  1  . folic acid (FOLVITE) 1 MG tablet Take 1 mg by mouth daily.    . methotrexate (RHEUMATREX) 2.5 MG tablet Take 15 mg by mouth once a week. Caution:Chemotherapy. Protect from light. Take 6 tablets weekly.    . metoprolol (LOPRESSOR) 50 MG tablet Take 50 mg by mouth 2 (two) times daily.    . predniSONE (DELTASONE) 10 MG tablet Take 10 mg by mouth daily with breakfast.    . ranitidine (ZANTAC) 150 MG tablet Take 150 mg by mouth 2 (two) times daily.    . simvastatin (ZOCOR) 20 MG tablet Take 20 mg by mouth daily.    . Vitamin D, Ergocalciferol, (DRISDOL) 50000 UNITS CAPS capsule Take 50,000 Units by mouth every 30 (thirty) days.         Infusions:   Allergies  Allergen Reactions  . Morphine And Related Hives and Itching  . Norco  [Hydrocodone-Acetaminophen] Hives and Itching    Social History   Social History  . Marital Status: Married    Spouse Name: N/A  . Number of Children: N/A  . Years of Education: N/A   Occupational History  . Not on file.   Social History Main Topics  . Smoking status: Never Smoker   . Smokeless tobacco: Not on file  . Alcohol Use: No  . Drug Use: No  . Sexual Activity: Not on file   Other Topics Concern  . Not on file   Social History Narrative    Family History  Problem Relation Age of Onset  . Cancer Sister 30    BREAST    REVIEW OF SYSTEMS:  All systems reviewed  Negative to the above problem except as noted above.    PHYSICAL EXAM: Filed Vitals:   02/08/2015 1621  BP: 156/97  Pulse: 72  Temp: 98 F (36.7 C)  Resp: 23     Intake/Output Summary (Last 24 hours) at 01/24/2015 1712 Last data filed at 02/03/2015 1624  Gross per 24 hour  Intake      0 ml  Output  100 ml  Net   -100 ml    General:  69 yo with some mild resp distress, intermitt coughing jags   HEENT: normal Neck: Full,  Unable to assess JVP  . Carotids 2+ bilat;  Cor: PMI nondisplaced. Regular rate & rhythm. No rubs, gallops or murmurs. Lungs: Mild upper airway wheeze Abdomen: soft, nontender, nondistended. No hepatosplenomegaly. No bruits or masses. Good bowel sounds. Extremities: no cyanosis, clubbing, rash, edema Neuro: alert & oriented x 3, cranial nerves grossly intact. moves all 4 extremities w/o difficulty. Affect pleasant.  ECG:  Pending    No results found for this or any previous visit (from the past 24 hour(s)). No results found.   ASSESSMENT:  69 yo with large pericardial effusion  Echo today shows it is approximately 30 mm, circumferential to heart.  There is mild mitral inflow variation.  The RV has diastolic indentation.  IVC is dilated.  LVEF is normal On exam she is hypertensive  Still with coughing jags. I think the pt needs to be admitted for evaluation by pulmonary  and oncology  She should have the pericardial effusion drained once pulm optimized/cough suppressed.

## 2015-01-25 NOTE — Consult Note (Signed)
Name: Maria Bauer MRN: [id number] DOB: April 27, 1945    ADMISSION DATE:  02/01/2015  CONSULTATION DATE:  02/11/2015  REFERRING MD :  Dr. Harrington Challenger  CHIEF COMPLAINT:  Pericardial effusion  BRIEF PATIENT DESCRIPTION: 69 year old female with recent diagnosis of mediastinal mass, was seen by cardiothoracic surgery in the outpatient setting who referred her for an echocardiogram 11/3. Echocardiogram findings included large pericardial effusion. Due to persistent cough she is considered high risk for pericardiocentesis and requires admission for cough suppression prior to procedure. PCCM asked to see in consultation.  SIGNIFICANT EVENTS  10/27 evaluated in ED for cough. Prior CXR from outpatient noted with possible chest mass. 11/2 seen by Dr. Prescott Gum as outpatient regarding CT findings, appears to be non-resectable mass 11/3 presented for echo, large effusion found, admitted for drainage.   STUDIES:  CT chest (10/28) > 12.0 x 8.8 cm abnormal soft tissue density seen in anterior mediastinum concerning for adenopathy related to lymphoma or other metastatic disease. Some compression of the left innominate vein is noted as a result. 5 mm nodule is noted posteriorly in the right lower lobe. This may represent metastatic focus given above finding. Alternatively, it may be unrelated. Continued CT follow-up is recommended to ensure stability. Moderate pericardial effusion is noted as well.   HISTORY OF PRESENT ILLNESS:  69 year old female past medical history as below, which includes hyperlipidemia, hypertension, GERD, melanoma (resected from her left forearm in 2013), steroid and methotrexate dependent rheumatoid arthritis, and asthma. She recently moved here from [place]. Shortly after that she developed acute progressive cough, which is described as nonproductive. She presented to her PCP who prescribed her a mild cough suppressant and center of her chest x-ray. Chest x-ray was concerning for malignancy.  CT scan obtained a few days later was positive for a 12 x 8 cm mediastinal mass and a moderate pericardial effusion. She was referred to Dr. Prescott Gum with cardiothoracic surgery. He feels as though this mass is probably nonresectable and that she is a poor candidate for general anesthesia. Plan was for needle biopsy via IR of the mediastinal mass and possible radiation and/or chemotherapy. She was also sent for echocardiogram 11/3, which noted a large pericardial effusion. No obvious signs of cardiac tamponade. It was felt that she would require pericardiocentesis to drain this effusion, however, severe cough makes her a high risk for this procedure. She will need admission for cough suppression prior to the procedure. P CCM has been asked to see in consultation.  PAST MEDICAL HISTORY :   has a past medical history of Asthma; Scoliosis; High cholesterol; Rheumatoid arthritis ([place]); Hypertension; Acid reflux; Insomnia; and Melanoma ([place]).  has past surgical history that includes Abdominal hysterectomy; Appendectomy; and Total knee arthroplasty (Bilateral). Prior to Admission medications   Medication Sig Start Date End Date Taking? Authorizing Provider  acetaminophen (TYLENOL) 325 MG tablet Take 650 mg by mouth every 6 (six) hours as needed for moderate pain.    Historical Provider, MD  albuterol (PROVENTIL) (5 MG/ML) 0.5% nebulizer solution Take 2.5 mg by nebulization every 6 (six) hours as needed for wheezing or shortness of breath.    Historical Provider, MD  alendronate (FOSAMAX) 70 MG tablet Take 70 mg by mouth once a week. Take with a full glass of water on an empty stomach. Take along with methotrexate.    Historical Provider, MD  benzonatate (TESSALON) 100 MG capsule TAKE ONE CAPSULE BY MOUTH 3 TIMES A DAY (NOT COVERED BY INSURANCE) 01/19/15   Historical Provider,  MD  folic acid (FOLVITE) 1 MG tablet Take 1 mg by mouth daily.    Historical Provider, MD  methotrexate (RHEUMATREX) 2.5 MG tablet Take  15 mg by mouth once a week. Caution:Chemotherapy. Protect from light. Take 6 tablets weekly.    Historical Provider, MD  metoprolol (LOPRESSOR) 50 MG tablet Take 50 mg by mouth 2 (two) times daily.    Historical Provider, MD  predniSONE (DELTASONE) 10 MG tablet Take 10 mg by mouth daily with breakfast.    Historical Provider, MD  ranitidine (ZANTAC) 150 MG tablet Take 150 mg by mouth 2 (two) times daily.    Historical Provider, MD  simvastatin (ZOCOR) 20 MG tablet Take 20 mg by mouth daily.    Historical Provider, MD  Vitamin D, Ergocalciferol, (DRISDOL) 50000 UNITS CAPS capsule Take 50,000 Units by mouth every 30 (thirty) days.    Historical Provider, MD   Allergies  Allergen Reactions  . Morphine And Related Hives and Itching  . Norco [Hydrocodone-Acetaminophen] Hives and Itching    FAMILY HISTORY:  family history includes Cancer (age of onset: 33) in her sister. SOCIAL HISTORY:  reports that she has never smoked. She does not have any smokeless tobacco history on file. She reports that she does not drink alcohol or use illicit drugs.  REVIEW OF SYSTEMS:   Bolds are positive  Constitutional: weight loss, gain, night sweats, Fevers, chills, fatigue .  HEENT: headaches, Sore throat, sneezing, nasal congestion, post nasal drip, Difficulty swallowing, Tooth/dental problems, visual complaints visual changes, ear ache CV:  chest pain, radiates: ,Orthopnea, PND, swelling in lower extremities, dizziness, palpitations, syncope.  GI  heartburn, indigestion, abdominal pain, nausea, vomiting, diarrhea, change in bowel habits, loss of appetite, bloody stools.  Resp: cough, non-productive: , hemoptysis, dyspnea, chest pain, pleuritic.  Skin: rash or itching or icterus GU: dysuria, change in color of urine, urgency or frequency. flank pain, hematuria  MS: joint pain or swelling. decreased range of motion  Psych: change in mood or affect. depression or anxiety.  Neuro: difficulty with speech,  weakness, numbness, ataxia    SUBJECTIVE:   VITAL SIGNS:    PHYSICAL EXAMINATION: General:  Obese female in NAD Neuro:  Alert, oriented, non-focal HEENT:  /AT, no appreciable JVD, PERRL Cardiovascular:  Muffled heart sounds, no MRG.  Lungs:  Clear bilateral breath sounds Abdomen:  Obese, soft, non-tender, non-distended Musculoskeletal:  No acute deformity or ROM limitation Skin:  Grossly intact   Recent Labs Lab 01/18/15 2148  NA 141  K 4.1  CL 104  BUN 20  CREATININE 0.60  GLUCOSE 90    Recent Labs Lab 01/18/15 2148  HGB 14.3  HCT 42.0   No results found.  ASSESSMENT / PLAN:  Large pericardial effusion  Cough/dyspnea Large mediastinal mass - concern for lymphoma. Followed by CVTS.  Pulmonary nodule - 19mm RLL  PLAN -  Admit SDU per [facility]  Supplemental O2 as needed  Cough suppression - tessalon, tussionex  BD's PRN Cards following for pericardiocentesis  Fluid for cytology  If non-diagnostic proceed with IR needed guided bx of large mass  Non-resectable per CVTS  Early oncology  Stat chem, mg, phos  EKG   PCCM will be available PRN  Georgann Housekeeper, AGACNP-BC [facility] Pulmonology/Critical Care Pager [phone] or [phone]  02/01/2015 4:02 PM

## 2015-01-26 ENCOUNTER — Inpatient Hospital Stay (HOSPITAL_COMMUNITY): Payer: Medicare (Managed Care) | Admitting: Certified Registered Nurse Anesthetist

## 2015-01-26 ENCOUNTER — Inpatient Hospital Stay (HOSPITAL_COMMUNITY): Payer: Medicare (Managed Care)

## 2015-01-26 ENCOUNTER — Other Ambulatory Visit: Payer: Self-pay

## 2015-01-26 ENCOUNTER — Encounter (HOSPITAL_COMMUNITY): Payer: Self-pay | Admitting: Certified Registered Nurse Anesthetist

## 2015-01-26 ENCOUNTER — Encounter (HOSPITAL_COMMUNITY): Admission: AD | Disposition: E | Payer: Self-pay | Source: Ambulatory Visit | Attending: Cardiothoracic Surgery

## 2015-01-26 DIAGNOSIS — I313 Pericardial effusion (noninflammatory): Secondary | ICD-10-CM

## 2015-01-26 DIAGNOSIS — R222 Localized swelling, mass and lump, trunk: Secondary | ICD-10-CM

## 2015-01-26 HISTORY — PX: MEDIASTINOTOMY CHAMBERLAIN MCNEIL: SHX5966

## 2015-01-26 HISTORY — PX: PERICARDIAL WINDOW: SHX2213

## 2015-01-26 LAB — URINALYSIS, ROUTINE W REFLEX MICROSCOPIC
Bilirubin Urine: NEGATIVE
Glucose, UA: NEGATIVE mg/dL
Ketones, ur: 40 mg/dL — AB
Leukocytes, UA: NEGATIVE
Nitrite: NEGATIVE
Protein, ur: NEGATIVE mg/dL
Specific Gravity, Urine: 1.022 (ref 1.005–1.030)
Urobilinogen, UA: 0.2 mg/dL (ref 0.0–1.0)
pH: 6 (ref 5.0–8.0)

## 2015-01-26 LAB — COMPREHENSIVE METABOLIC PANEL
ALBUMIN: 3.5 g/dL (ref 3.5–5.0)
ALT: 68 U/L — ABNORMAL HIGH (ref 14–54)
ALT: 58 U/L — ABNORMAL HIGH (ref 14–54)
ANION GAP: 11 (ref 5–15)
AST: 35 U/L (ref 15–41)
AST: 29 U/L (ref 15–41)
Albumin: 3.1 g/dL — ABNORMAL LOW (ref 3.5–5.0)
Alkaline Phosphatase: 57 U/L (ref 38–126)
Alkaline Phosphatase: 49 U/L (ref 38–126)
Anion gap: 13 (ref 5–15)
BILIRUBIN TOTAL: 0.9 mg/dL (ref 0.3–1.2)
BUN: 20 mg/dL (ref 6–20)
BUN: 19 mg/dL (ref 6–20)
CHLORIDE: 105 mmol/L (ref 101–111)
CO2: 25 mmol/L (ref 22–32)
CO2: 23 mmol/L (ref 22–32)
Calcium: 11.1 mg/dL — ABNORMAL HIGH (ref 8.9–10.3)
Calcium: 10.3 mg/dL (ref 8.9–10.3)
Chloride: 106 mmol/L (ref 101–111)
Creatinine, Ser: 0.62 mg/dL (ref 0.44–1.00)
Creatinine, Ser: 0.67 mg/dL (ref 0.44–1.00)
GFR calc Af Amer: >60 mL/min (ref >60)
GFR calc Af Amer: >60 mL/min (ref >60)
GFR calc non Af Amer: >60 mL/min (ref >60)
GLUCOSE: 94 mg/dL (ref 65–99)
Glucose, Bld: 90 mg/dL (ref 65–99)
POTASSIUM: 3.9 mmol/L (ref 3.5–5.1)
Potassium: 3.6 mmol/L (ref 3.5–5.1)
Sodium: 141 mmol/L (ref 135–145)
Sodium: 142 mmol/L (ref 135–145)
TOTAL PROTEIN: 5.9 g/dL — AB (ref 6.5–8.1)
Total Bilirubin: 0.8 mg/dL (ref 0.3–1.2)
Total Protein: 5.2 g/dL — ABNORMAL LOW (ref 6.5–8.1)

## 2015-01-26 LAB — CBC
HCT: 39.4 % (ref 36.0–46.0)
HEMATOCRIT: 39.9 % (ref 36.0–46.0)
HEMOGLOBIN: 12.6 g/dL (ref 12.0–15.0)
Hemoglobin: 12.3 g/dL (ref 12.0–15.0)
MCH: 31.3 pg (ref 26.0–34.0)
MCH: 30.9 pg (ref 26.0–34.0)
MCHC: 31.6 g/dL (ref 30.0–36.0)
MCHC: 31.2 g/dL (ref 30.0–36.0)
MCV: 99 fL (ref 78.0–100.0)
MCV: 99 fL (ref 78.0–100.0)
Platelets: 261 10*3/uL (ref 150–400)
Platelets: 175 10*3/uL (ref 150–400)
RBC: 4.03 MIL/uL (ref 3.87–5.11)
RBC: 3.98 MIL/uL (ref 3.87–5.11)
RDW: 15.1 % (ref 11.5–15.5)
RDW: 14.9 % (ref 11.5–15.5)
WBC: 8.3 10*3/uL (ref 4.0–10.5)
WBC: 10.1 10*3/uL (ref 4.0–10.5)

## 2015-01-26 LAB — TYPE AND SCREEN
ABO/RH(D): A POS
Antibody Screen: NEGATIVE

## 2015-01-26 LAB — URINE MICROSCOPIC-ADD ON

## 2015-01-26 LAB — POCT I-STAT 3, ART BLOOD GAS (G3+)
Bicarbonate: 25.6 mEq/L — ABNORMAL HIGH (ref 20.0–24.0)
O2 SAT: 92 %
PCO2 ART: 44.7 mmHg (ref 35.0–45.0)
TCO2: 27 mmol/L (ref 0–100)
pH, Arterial: 7.365 (ref 7.350–7.450)
pO2, Arterial: 68 mmHg — ABNORMAL LOW (ref 80.0–100.0)

## 2015-01-26 LAB — PROTIME-INR
INR: 1.24 (ref 0.00–1.49)
Prothrombin Time: 15.7 seconds — ABNORMAL HIGH (ref 11.6–15.2)

## 2015-01-26 LAB — APTT
aPTT: 26 seconds (ref 24–37)
aPTT: 20 seconds — ABNORMAL LOW (ref 24–37)

## 2015-01-26 LAB — ABO/RH: ABO/RH(D): A POS

## 2015-01-26 SURGERY — CREATION, PERICARDIAL WINDOW
Anesthesia: General | Site: Chest

## 2015-01-26 SURGERY — PERICARDIOCENTESIS
Anesthesia: LOCAL

## 2015-01-26 MED ORDER — SODIUM CHLORIDE 0.9 % IV SOLN: 250.0000 mL | INTRAVENOUS | Status: DC | PRN

## 2015-01-26 MED ORDER — POTASSIUM CHLORIDE 10 MEQ/50ML IV SOLN
10.0000 meq | Freq: Every day | INTRAVENOUS | Status: DC | PRN
  Filled 2015-01-26: qty 50

## 2015-01-26 MED ORDER — FENTANYL CITRATE (PF) 100 MCG/2ML IJ SOLN
50.0000 ug | INTRAMUSCULAR | Status: DC | PRN
  Administered 2015-01-26 – 2015-02-01 (×19): 50 ug via INTRAVENOUS
  Filled 2015-01-26 (×20): qty 2

## 2015-01-26 MED ORDER — LACTATED RINGERS IV SOLN
INTRAVENOUS | Status: DC
  Administered 2015-01-26: 11:00:00 via INTRAVENOUS

## 2015-01-26 MED ORDER — NOREPINEPHRINE BITARTRATE 1 MG/ML IV SOLN
4000.0000 ug | INTRAVENOUS | Status: DC | PRN
  Administered 2015-01-26: 2 ug/min via INTRAVENOUS

## 2015-01-26 MED ORDER — SODIUM CHLORIDE 0.9 % IV SOLN: INTRAVENOUS | Status: DC

## 2015-01-26 MED ORDER — MIDAZOLAM HCL 2 MG/2ML IJ SOLN
INTRAMUSCULAR | Status: AC
  Filled 2015-01-26: qty 4

## 2015-01-26 MED ORDER — PROPOFOL 1000 MG/100ML IV EMUL
5.0000 ug/kg/min | INTRAVENOUS | Status: DC
  Administered 2015-01-26: 40 ug/kg/min via INTRAVENOUS
  Administered 2015-01-26 – 2015-01-27 (×3): 50 ug/kg/min via INTRAVENOUS
  Administered 2015-01-27 (×2): 40 ug/kg/min via INTRAVENOUS
  Administered 2015-01-27: 30 ug/kg/min via INTRAVENOUS
  Administered 2015-01-28: 15 ug/kg/min via INTRAVENOUS
  Administered 2015-01-28: 40 ug/kg/min via INTRAVENOUS
  Administered 2015-01-28: 30 ug/kg/min via INTRAVENOUS
  Administered 2015-01-28: 40 ug/kg/min via INTRAVENOUS
  Administered 2015-01-28: 25 ug/kg/min via INTRAVENOUS
  Administered 2015-01-29: 40 ug/kg/min via INTRAVENOUS
  Administered 2015-01-29: 35 ug/kg/min via INTRAVENOUS
  Administered 2015-01-29: 40 ug/kg/min via INTRAVENOUS
  Administered 2015-01-29 (×2): 35 ug/kg/min via INTRAVENOUS
  Administered 2015-01-29: 40 ug/kg/min via INTRAVENOUS
  Administered 2015-01-30: 35 ug/kg/min via INTRAVENOUS
  Administered 2015-01-30: 34.916 ug/kg/min via INTRAVENOUS
  Filled 2015-01-26 (×19): qty 100

## 2015-01-26 MED ORDER — MIDAZOLAM HCL 5 MG/5ML IJ SOLN
INTRAMUSCULAR | Status: DC | PRN
  Administered 2015-01-26: 2 mg via INTRAVENOUS

## 2015-01-26 MED ORDER — FENTANYL CITRATE (PF) 100 MCG/2ML IJ SOLN
INTRAMUSCULAR | Status: DC | PRN
  Administered 2015-01-26: 50 ug via INTRAVENOUS
  Administered 2015-01-26: 100 ug via INTRAVENOUS
  Administered 2015-01-26: 50 ug via INTRAVENOUS
  Administered 2015-01-26 (×2): 100 ug via INTRAVENOUS
  Administered 2015-01-26 (×3): 50 ug via INTRAVENOUS
  Administered 2015-01-26: 150 ug via INTRAVENOUS
  Administered 2015-01-26: 50 ug via INTRAVENOUS

## 2015-01-26 MED ORDER — FENTANYL CITRATE (PF) 250 MCG/5ML IJ SOLN
INTRAMUSCULAR | Status: AC
  Filled 2015-01-26: qty 5

## 2015-01-26 MED ORDER — ACETAMINOPHEN 160 MG/5ML PO SOLN
1000.0000 mg | Freq: Four times a day (QID) | ORAL | Status: AC
  Administered 2015-01-26 – 2015-01-31 (×18): 1000 mg via ORAL
  Filled 2015-01-26 (×18): qty 40.6

## 2015-01-26 MED ORDER — LACTATED RINGERS IV SOLN
INTRAVENOUS | Status: DC | PRN
  Administered 2015-01-26 (×2): via INTRAVENOUS

## 2015-01-26 MED ORDER — DEXTROSE 5 % IV SOLN: 1.5000 g | INTRAVENOUS | Status: DC

## 2015-01-26 MED ORDER — DEXTROSE 5 % IV SOLN
INTRAVENOUS | Status: AC
  Administered 2015-01-26: 1.5 g via INTRAVENOUS
  Filled 2015-01-26: qty 1.5

## 2015-01-26 MED ORDER — SODIUM CHLORIDE 0.9 % IJ SOLN: 3.0000 mL | Freq: Two times a day (BID) | INTRAMUSCULAR | Status: DC

## 2015-01-26 MED ORDER — DEXTROSE 5 % IV SOLN
1.5000 g | Freq: Two times a day (BID) | INTRAVENOUS | Status: AC
  Administered 2015-01-26 – 2015-01-27 (×2): 1.5 g via INTRAVENOUS
  Filled 2015-01-26 (×2): qty 1.5

## 2015-01-26 MED ORDER — ACETAMINOPHEN 500 MG PO TABS
1000.0000 mg | ORAL_TABLET | Freq: Four times a day (QID) | ORAL | Status: AC
  Administered 2015-01-27: 1000 mg via ORAL
  Filled 2015-01-26 (×16): qty 2

## 2015-01-26 MED ORDER — SENNOSIDES-DOCUSATE SODIUM 8.6-50 MG PO TABS
1.0000 | ORAL_TABLET | Freq: Every day | ORAL | Status: DC
  Administered 2015-02-02 – 2015-02-07 (×4): 1 via ORAL
  Filled 2015-01-26 (×12): qty 1

## 2015-01-26 MED ORDER — LORAZEPAM 2 MG/ML IJ SOLN: 1.0000 mg | INTRAMUSCULAR | Status: DC | PRN

## 2015-01-26 MED ORDER — HEMOSTATIC AGENTS (NO CHARGE) OPTIME
TOPICAL | Status: DC | PRN
  Administered 2015-01-26: 1 via TOPICAL

## 2015-01-26 MED ORDER — 0.9 % SODIUM CHLORIDE (POUR BTL) OPTIME
TOPICAL | Status: DC | PRN
  Administered 2015-01-26: 1000 mL

## 2015-01-26 MED ORDER — ROCURONIUM BROMIDE 100 MG/10ML IV SOLN
INTRAVENOUS | Status: DC | PRN
  Administered 2015-01-26 (×2): 20 mg via INTRAVENOUS
  Administered 2015-01-26 (×2): 30 mg via INTRAVENOUS

## 2015-01-26 MED ORDER — FENTANYL CITRATE (PF) 100 MCG/2ML IJ SOLN
50.0000 ug | INTRAMUSCULAR | Status: AC | PRN
  Administered 2015-01-28 (×2): 50 ug via INTRAVENOUS
  Filled 2015-01-26 (×3): qty 2

## 2015-01-26 MED ORDER — ETOMIDATE 2 MG/ML IV SOLN
INTRAVENOUS | Status: DC | PRN
  Administered 2015-01-26: 14 mg via INTRAVENOUS
  Administered 2015-01-26: 6 mg via INTRAVENOUS

## 2015-01-26 MED ORDER — BISACODYL 5 MG PO TBEC
10.0000 mg | DELAYED_RELEASE_TABLET | Freq: Every day | ORAL | Status: DC
  Administered 2015-02-04 – 2015-02-13 (×7): 10 mg via ORAL
  Filled 2015-01-26 (×10): qty 2

## 2015-01-26 MED ORDER — NOREPINEPHRINE BITARTRATE 1 MG/ML IV SOLN
0.0000 ug/min | INTRAVENOUS | Status: DC
  Filled 2015-01-26: qty 4

## 2015-01-26 MED ORDER — CHLORHEXIDINE GLUCONATE 0.12% ORAL RINSE (MEDLINE KIT)
15.0000 mL | Freq: Two times a day (BID) | OROMUCOSAL | Status: DC
  Administered 2015-01-26 – 2015-02-14 (×37): 15 mL via OROMUCOSAL

## 2015-01-26 MED ORDER — FAMOTIDINE IN NACL 20-0.9 MG/50ML-% IV SOLN
20.0000 mg | Freq: Two times a day (BID) | INTRAVENOUS | Status: DC
  Administered 2015-01-26 – 2015-02-02 (×15): 20 mg via INTRAVENOUS
  Filled 2015-01-26 (×15): qty 50

## 2015-01-26 MED ORDER — PROPOFOL 500 MG/50ML IV EMUL
INTRAVENOUS | Status: DC | PRN
  Administered 2015-01-26: 20 ug/kg/min via INTRAVENOUS

## 2015-01-26 MED ORDER — DEXTROSE 5 % IV SOLN
0.0000 ug/min | INTRAVENOUS | Status: DC
  Filled 2015-01-26: qty 4

## 2015-01-26 MED ORDER — DEXAMETHASONE SODIUM PHOSPHATE 4 MG/ML IJ SOLN: 4.0000 mg | Freq: Once | INTRAMUSCULAR | Status: DC

## 2015-01-26 MED ORDER — PROPOFOL 10 MG/ML IV BOLUS
INTRAVENOUS | Status: DC | PRN
  Administered 2015-01-26: 50 mg via INTRAVENOUS
  Administered 2015-01-26 (×2): 30 mg via INTRAVENOUS
  Administered 2015-01-26: 40 mg via INTRAVENOUS

## 2015-01-26 MED ORDER — SUCCINYLCHOLINE CHLORIDE 20 MG/ML IJ SOLN
INTRAMUSCULAR | Status: DC | PRN
  Administered 2015-01-26: 100 mg via INTRAVENOUS

## 2015-01-26 MED ORDER — PROPOFOL 10 MG/ML IV BOLUS: INTRAVENOUS | Status: DC | PRN

## 2015-01-26 MED ORDER — HYDROCOD POLST-CPM POLST ER 10-8 MG/5ML PO SUER: 5.0000 mL | Freq: Four times a day (QID) | ORAL | Status: DC | PRN

## 2015-01-26 MED ORDER — LACTATED RINGERS IV SOLN
INTRAVENOUS | Status: DC | PRN
  Administered 2015-01-26: 12:00:00 via INTRAVENOUS

## 2015-01-26 MED ORDER — SUGAMMADEX SODIUM 200 MG/2ML IV SOLN
INTRAVENOUS | Status: AC
  Filled 2015-01-26: qty 2

## 2015-01-26 MED ORDER — ANTISEPTIC ORAL RINSE SOLUTION (CORINZ)
7.0000 mL | Freq: Four times a day (QID) | OROMUCOSAL | Status: DC
  Administered 2015-01-26 – 2015-02-14 (×76): 7 mL via OROMUCOSAL

## 2015-01-26 MED ORDER — ONDANSETRON HCL 4 MG/2ML IJ SOLN
4.0000 mg | Freq: Four times a day (QID) | INTRAMUSCULAR | Status: DC | PRN
  Administered 2015-02-07 – 2015-02-13 (×3): 4 mg via INTRAVENOUS
  Filled 2015-01-26 (×3): qty 2

## 2015-01-26 MED ORDER — KCL IN DEXTROSE-NACL 20-5-0.45 MEQ/L-%-% IV SOLN
INTRAVENOUS | Status: DC
  Administered 2015-01-26: 15:00:00 via INTRAVENOUS
  Administered 2015-01-27: 100 mL/h via INTRAVENOUS
  Administered 2015-01-27: 16:00:00 via INTRAVENOUS
  Administered 2015-01-28: 100 mL/h via INTRAVENOUS
  Administered 2015-01-28 – 2015-01-29 (×2): via INTRAVENOUS
  Filled 2015-01-26 (×9): qty 1000

## 2015-01-26 MED ORDER — SODIUM CHLORIDE 0.9 % IJ SOLN: 3.0000 mL | INTRAMUSCULAR | Status: DC | PRN

## 2015-01-26 SURGICAL SUPPLY — 83 items
APPLICATOR TIP COSEAL (VASCULAR PRODUCTS) IMPLANT
APPLICATOR TIP EXT COSEAL (VASCULAR PRODUCTS) IMPLANT
ATTRACTOMAT 16X20 MAGNETIC DRP (DRAPES) IMPLANT
BANDAGE HEMOSTAT MRDH 4X4 STRL (MISCELLANEOUS) ×2 IMPLANT
BENZOIN TINCTURE PRP APPL 2/3 (GAUZE/BANDAGES/DRESSINGS) ×8 IMPLANT
BNDG HEMOSTAT MRDH 4X4 STRL (MISCELLANEOUS) ×4
CANISTER SUCTION 2500CC (MISCELLANEOUS) ×4 IMPLANT
CATH KIT ON Q 5IN SLV (PAIN MANAGEMENT) IMPLANT
CATH THORACIC 28FR (CATHETERS) ×4 IMPLANT
CATH THORACIC 28FR RT ANG (CATHETERS) ×8 IMPLANT
CATH THORACIC 36FR (CATHETERS) IMPLANT
CATH THORACIC 36FR RT ANG (CATHETERS) IMPLANT
CLIP TI MEDIUM 24 (CLIP) ×4 IMPLANT
CLIP TI MEDIUM 6 (CLIP) IMPLANT
CLOSURE STERI-STRIP 1/2X4 (GAUZE/BANDAGES/DRESSINGS) ×2
CLOSURE WOUND 1/2 X4 (GAUZE/BANDAGES/DRESSINGS)
CLSR STERI-STRIP ANTIMIC 1/2X4 (GAUZE/BANDAGES/DRESSINGS) ×6 IMPLANT
CONT SPEC 4OZ CLIKSEAL STRL BL (MISCELLANEOUS) ×20 IMPLANT
COVER SURGICAL LIGHT HANDLE (MISCELLANEOUS) ×4 IMPLANT
DERMABOND ADVANCED (GAUZE/BANDAGES/DRESSINGS)
DERMABOND ADVANCED .7 DNX12 (GAUZE/BANDAGES/DRESSINGS) IMPLANT
DRAIN CHANNEL 28F RND 3/8 FF (WOUND CARE) IMPLANT
DRAPE LAPAROSCOPIC ABDOMINAL (DRAPES) ×4 IMPLANT
DRAPE WARM FLUID 44X44 (DRAPE) IMPLANT
ELECT BLADE 4.0 EZ CLEAN MEGAD (MISCELLANEOUS) ×4
ELECT REM PT RETURN 9FT ADLT (ELECTROSURGICAL) ×4
ELECTRODE BLDE 4.0 EZ CLN MEGD (MISCELLANEOUS) ×2 IMPLANT
ELECTRODE REM PT RTRN 9FT ADLT (ELECTROSURGICAL) ×2 IMPLANT
GAUZE SPONGE 4X4 12PLY STRL (GAUZE/BANDAGES/DRESSINGS) ×4 IMPLANT
GLOVE BIO SURGEON STRL SZ7.5 (GLOVE) ×8 IMPLANT
GOWN STRL REUS W/ TWL LRG LVL3 (GOWN DISPOSABLE) ×8 IMPLANT
GOWN STRL REUS W/TWL LRG LVL3 (GOWN DISPOSABLE) ×8
HEMOSTAT POWDER SURGIFOAM 1G (HEMOSTASIS) IMPLANT
HEMOSTAT SURGICEL 2X14 (HEMOSTASIS) ×4 IMPLANT
KIT BASIN OR (CUSTOM PROCEDURE TRAY) ×4 IMPLANT
KIT ROOM TURNOVER OR (KITS) ×4 IMPLANT
KIT SUCTION CATH 14FR (SUCTIONS) ×4 IMPLANT
NS IRRIG 1000ML POUR BTL (IV SOLUTION) ×8 IMPLANT
PACK CHEST (CUSTOM PROCEDURE TRAY) ×4 IMPLANT
PAD ARMBOARD 7.5X6 YLW CONV (MISCELLANEOUS) ×8 IMPLANT
PAD ELECT DEFIB RADIOL ZOLL (MISCELLANEOUS) ×4 IMPLANT
SEALANT PROGEL (MISCELLANEOUS) IMPLANT
SEALANT SURG COSEAL 4ML (VASCULAR PRODUCTS) IMPLANT
SEALANT SURG COSEAL 8ML (VASCULAR PRODUCTS) IMPLANT
SOLUTION ANTI FOG 6CC (MISCELLANEOUS) IMPLANT
SPONGE GAUZE 4X4 12PLY STER LF (GAUZE/BANDAGES/DRESSINGS) ×8 IMPLANT
SPONGE TONSIL 1 RF SGL (DISPOSABLE) ×4 IMPLANT
STRIP CLOSURE SKIN 1/2X4 (GAUZE/BANDAGES/DRESSINGS) IMPLANT
SUT PROLENE 3 0 SH DA (SUTURE) IMPLANT
SUT PROLENE 4 0 RB 1 (SUTURE)
SUT PROLENE 4-0 RB1 .5 CRCL 36 (SUTURE) IMPLANT
SUT PROLENE 6 0 C 1 30 (SUTURE) IMPLANT
SUT SILK  1 MH (SUTURE) ×12
SUT SILK 1 MH (SUTURE) ×12 IMPLANT
SUT SILK 1 TIES 10X30 (SUTURE) IMPLANT
SUT SILK 2 0 SH CR/8 (SUTURE) IMPLANT
SUT SILK 2 0SH CR/8 30 (SUTURE) IMPLANT
SUT SILK 3 0SH CR/8 30 (SUTURE) IMPLANT
SUT VIC AB 1 CTX 18 (SUTURE) ×12 IMPLANT
SUT VIC AB 1 CTX 36 (SUTURE) ×4
SUT VIC AB 1 CTX36XBRD ANBCTR (SUTURE) ×4 IMPLANT
SUT VIC AB 2-0 CT2 18 VCP726D (SUTURE) IMPLANT
SUT VIC AB 2-0 CTX 27 (SUTURE) ×4 IMPLANT
SUT VIC AB 2-0 CTX 36 (SUTURE) ×8 IMPLANT
SUT VIC AB 2-0 UR6 27 (SUTURE) IMPLANT
SUT VIC AB 3-0 SH 18 (SUTURE) IMPLANT
SUT VIC AB 3-0 SH 8-18 (SUTURE) IMPLANT
SUT VIC AB 3-0 X1 27 (SUTURE) ×8 IMPLANT
SUT VICRYL 2 TP 1 (SUTURE) IMPLANT
SWAB COLLECTION DEVICE MRSA (MISCELLANEOUS) IMPLANT
SYR 50ML SLIP (SYRINGE) IMPLANT
SYRINGE 10CC LL (SYRINGE) IMPLANT
SYSTEM SAHARA CHEST DRAIN ATS (WOUND CARE) ×4 IMPLANT
TAPE CLOTH SURG 4X10 WHT LF (GAUZE/BANDAGES/DRESSINGS) ×8 IMPLANT
TIP APPLICATOR SPRAY EXTEND 16 (VASCULAR PRODUCTS) IMPLANT
TOWEL OR 17X24 6PK STRL BLUE (TOWEL DISPOSABLE) ×4 IMPLANT
TOWEL OR 17X26 10 PK STRL BLUE (TOWEL DISPOSABLE) ×4 IMPLANT
TRAP SPECIMEN MUCOUS 40CC (MISCELLANEOUS) ×4 IMPLANT
TRAY FOLEY CATH 14FRSI W/METER (CATHETERS) ×4 IMPLANT
TRAY FOLEY CATH 16FRSI W/METER (SET/KITS/TRAYS/PACK) IMPLANT
TRAY FOLEY IC TEMP SENS 14FR (CATHETERS) ×4 IMPLANT
TUBE ANAEROBIC SPECIMEN COL (MISCELLANEOUS) IMPLANT
WATER STERILE IRR 1000ML POUR (IV SOLUTION) ×12 IMPLANT

## 2015-01-26 NOTE — Anesthesia Postprocedure Evaluation (Signed)
  Anesthesia Post-op Note  Patient: Maria Bauer  Procedure(s) Performed: Procedure(s): PERICARDIAL WINDOW (N/A) MEDIASTINOTOMY CHAMBERLAIN MCNEIL (Left)  Patient Location: ICU  Anesthesia Type:General  Level of Consciousness: sedated  Airway and Oxygen Therapy: Patient remains intubated per anesthesia plan  Post-op Pain: none  Post-op Assessment: Post-op Vital signs reviewed, Patient's Cardiovascular Status Stable, Respiratory Function Stable, Patent Airway and Pain level controlled              Post-op Vital Signs: Reviewed and stable  Last Vitals:  Filed Vitals:   01/25/2015 1445  BP: 161/81  Pulse: 94  Temp:   Resp: 14    Complications: No apparent anesthesia complications

## 2015-01-26 NOTE — Progress Notes (Signed)
Procedure(s) (LRB): Pericardiocentesis (N/A) Subjective: Echocardiogram personally reviewed- large effusion Will proceed with pericardial window and biopsy of       mediastinal mass under general    Anesthesia  preop decadron ordered  Objective: Vital signs in last 24 hours: Temp:  [98 F (36.7 C)-98.3 F (36.8 C)] 98.2 F (36.8 C) (11/04 0802) Pulse Rate:  [58-97] 85 (11/04 0802) Cardiac Rhythm:  [-] Normal sinus rhythm (11/04 0700) Resp:  [14-35] 20 (11/04 0802) BP: (93-156)/(45-97) 123/63 mmHg (11/04 0802) SpO2:  [88 %-100 %] 100 % (11/04 0802) Weight:  [213 lb 10 oz (96.9 kg)] 213 lb 10 oz (96.9 kg) (11/03 1621)  Hemodynamic parameters for last 24 hours:  nsr afebrile  Intake/Output from previous day: 11/03 0701 - 11/04 0700 In: 500 [IV Piggyback:500] Out: 100 [Urine:100] Intake/Output this shift:    Sitting up in bed Wheezes on L nsr Lab Results:  Recent Labs  02/02/2015 2245 01/27/2015 0427  WBC 6.2 8.3  HGB 12.4 12.6  HCT 39.5 39.9  PLT 260 261   BMET:  Recent Labs  02/21/2015 2245 01/30/2015 0427  NA 140 141  K 4.1 3.9  CL 107 105  CO2 26 25  GLUCOSE 105* 94  BUN 19 20  CREATININE 0.61 0.62  CALCIUM 10.8* 11.1*    PT/INR:  Recent Labs  01/28/2015 2245  LABPROT 15.2  INR 1.18   ABG    Component Value Date/Time   PHART 7.374 02/20/2015 1725   HCO3 23.9 02/07/2015 1725   TCO2 25.2 02/13/2015 1725   ACIDBASEDEF 0.6 02/21/2015 1725   O2SAT 97.3 02/13/2015 1725   CBG (last 3)  No results for input(s): GLUCAP in the last 72 hours.  Assessment/Plan: S/P Procedure(s) (LRB): Pericardiocentesis (N/A) Pericardial window and bx of mediastinal mass with L chamberlain procedure w/ general anesthesia today   LOS: 1 day    Tharon Aquas Trigt III 02/07/2015

## 2015-01-26 NOTE — Progress Notes (Signed)
[facility] Progress Note Patient Name: Malayasia Mirkin DOB: Jul 15, 1945 MRN: [id number]   Date of Service  02/19/2015  HPI/Events of Note  ABG on 50%/PRVC 14/TV 440/P 5 = 7.36/44.7/68/25.  eICU Interventions  Continue present management. Will alter ventilator setting orders to reflect current settings.      Intervention Category Major Interventions: Respiratory failure - evaluation and management  Sommer,Steven Eugene 02/16/2015, 3:11 PM

## 2015-01-26 NOTE — Progress Notes (Signed)
CT Surgery  Will plan pericardial window and biopsy today

## 2015-01-26 NOTE — Progress Notes (Signed)
[facility] PROGRESS NOTE  Donzella Carrol YJE:[id number] DOB: 1946/01/20 DOA: 02/16/2015  PCP: Angelica Pou, MD  HPI Maria Bauer is a 69 y.o. female with a history of hypertension, RA and melanoma status post resection with remission (2013) who was admitted on 11/3 after a large pericardial effusion was seen on echo.Over the past 1-2 week patient has had cough and progressive shortness of breath. Outpatient CT chest on 10/28 showed a large mediastinal mass. Echo performed on 11/3 showed large pericardial effusion with impending tamponade and patient was directly admitted to SDU.  Subjective: - She is scheduled for periardial window with biopsy this morning.  - The patient endorses dyspnea at rest, persistent cough, diaphoresis and increasing fatigue. She denies chest pain, palpitations, nausea, vomiting, abdominal pain, lightheadedness and dizziness.  Assessment/Plan: Pericardial effusion with impending tamponade - cardiology and TCV consulted, patient to have planned pericardial window and biopsy - closely monitor overnight in SDU for hypotension or evidence of decompensation, low threshold for ICU transfer. PCCM consulted and following - patient currently stable  Acute respiratory failure with hypoxia - supplemental oxygen as needed, slightly increase HR this morning  Mediastinal mass - biopsy planned for today  History of bilateral knee replacement - poor functional status at baseline - PT once acute issues resolve  RA - continue steroids - hold methotrexate  HTN - hold metoprolol for now  HLD - continue statin  Code Status: Full code Family Communication: daughter at bedside Disposition Plan: inpatient  Consultants:  Cardiology  TCV  PCCM  Procedures:  None   Antibiotics:  Cefuroxime peri-op 11/4  Objective: Filed Vitals:   02/05/2015 0945  BP:   Pulse: 102  Temp:   Resp:     Intake/Output Summary (Last 24 hours) at 02/03/2015  1224 Last data filed at 02/11/2015 2204  Gross per 24 hour  Intake    500 ml  Output    100 ml  Net    400 ml   Filed Weights   02/02/2015 1621  Weight: 96.9 kg (213 lb 10 oz)   Exam:  GENERAL: in distress, breathing fast  HEENT: head NCAT, no scleral icterus. Pupils round and reactive. Mucous membranes are moist.   NECK: Supple. No carotid bruits. No lymphadenopathy or thyromegaly.  LUNGS: Clear to auscultation. No wheezing or crackles  HEART: Regular rate and rhythm without murmur. 2+ pulses, pulsus paradoxus present, no peripheral edema  ABDOMEN: Soft, nontender, and nondistended. Positive bowel sounds.   EXTREMITIES: Without any cyanosis, clubbing, rash, lesions or edema.  NEUROLOGIC: Alert and oriented x3. Non focal  PSYCHIATRIC: anxious  SKIN: No ulceration or induration present. Scar left forearm present  Data Reviewed: Basic Metabolic Panel:  Recent Labs Lab 02/12/2015 2245 01/23/2015 0427  NA 140 141  K 4.1 3.9  CL 107 105  CO2 26 25  GLUCOSE 105* 94  BUN 19 20  CREATININE 0.61 0.62  CALCIUM 10.8* 11.1*   Liver Function Tests:  Recent Labs Lab 01/24/2015 2245 02/16/2015 0427  AST 34 35  ALT 70* 68*  ALKPHOS 53 57  BILITOT 0.9 0.9  PROT 5.8* 5.9*  ALBUMIN 3.4* 3.5   CBC:  Recent Labs Lab 01/27/2015 2245 02/06/2015 0427  WBC 6.2 8.3  HGB 12.4 12.6  HCT 39.5 39.9  MCV 100.0 99.0  PLT 260 261   Recent Results (from the past 240 hour(s))  MRSA PCR Screening     Status: None   Collection Time: 02/16/2015  5:17 PM  Result  Value Ref Range Status   MRSA by PCR NEGATIVE NEGATIVE Final    Comment:        The GeneXpert MRSA Assay (FDA approved for NASAL specimens only), is one component of a comprehensive MRSA colonization surveillance program. It is not intended to diagnose MRSA infection nor to guide or monitor treatment for MRSA infections.    Studies: No results found.  Scheduled Meds: . cefUROXime (ZINACEF)  IV  1.5 g Intravenous 60 min  Pre-Op  . dexamethasone  4 mg Intravenous Once  . cefUROXime (ZINACEF) 1.5 GM IVPB      . [MAR Hold] famotidine  10 mg Oral BID  . [MAR Hold] folic acid  1 mg Oral Daily  . [MAR Hold] predniSONE  10 mg Oral Q breakfast  . [MAR Hold] simvastatin  20 mg Oral Daily  . sodium chloride  3 mL Intravenous Q12H   Continuous Infusions: . sodium chloride    . lactated ringers 50 mL/hr at 01/29/2015 1032  . norepinephrine (LEVOPHED) Adult infusion     Active Problems:   Pericardial effusion   Mediastinal mass   HTN (hypertension)   Rheumatoid arthritis ([facility])   Acute respiratory failure with hypoxia ([facility])   Pericardial effusion with cardiac tamponade   Cardiac tamponade   HLD (hyperlipidemia)   Gloriann Riede M. Cruzita Lederer, MD [facility] [phone] [facility]  If 7PM-7AM, please contact night-coverage at [email or web address], password [facility] 02/06/2015, 12:24 PM  LOS: 1 day

## 2015-01-26 NOTE — Anesthesia Procedure Notes (Addendum)
Procedure Name: Intubation Date/Time: 02/15/2015 1:04 PM Performed by: Merdis Delay Pre-anesthesia Checklist: Patient identified, Timeout performed, Emergency Drugs available, Suction available and Patient being monitored Patient Re-evaluated:Patient Re-evaluated prior to inductionOxygen Delivery Method: Circle system utilized Preoxygenation: Pre-oxygenation with 100% oxygen Intubation Type: IV induction Laryngoscope Size: Glidescope and 3 Tube type: Subglottic suction tube Tube size: 8.0 mm Number of attempts: 1 Placement Confirmation: breath sounds checked- equal and bilateral,  positive ETCO2,  CO2 detector and ETT inserted through vocal cords under direct vision Secured at: 22 cm Tube secured with: Tape Dental Injury: Teeth and Oropharynx as per pre-operative assessment  Difficulty Due To: Difficulty was anticipated

## 2015-01-26 NOTE — Anesthesia Preprocedure Evaluation (Signed)
Anesthesia Evaluation  Patient identified by MRN, date of birth, ID band Patient awake    Reviewed: Allergy & Precautions, NPO status , Patient's Chart, lab work & pertinent test results  History of Anesthesia Complications Negative for: history of anesthetic complications  Airway Mallampati: III  TM Distance: >3 FB Neck ROM: Full    Dental  (+) Teeth Intact   Pulmonary shortness of breath, at rest and lying, asthma ,     + decreased breath sounds+ wheezing      Cardiovascular hypertension, Pt. on medications and Pt. on home beta blockers  Rhythm:Regular Rate:Tachycardia     Neuro/Psych  Headaches, PSYCHIATRIC DISORDERS Anxiety    GI/Hepatic Neg liver ROS, PUD, GERD  Medicated and Controlled,  Endo/Other  Morbid obesity  Renal/GU negative Renal ROS     Musculoskeletal  (+) Arthritis ,   Abdominal   Peds  Hematology negative hematology ROS (+)   Anesthesia Other Findings   Reproductive/Obstetrics                             Anesthesia Physical Anesthesia Plan  ASA: IV and emergent  Anesthesia Plan: General   Post-op Pain Management:    Induction: Intravenous  Airway Management Planned: Oral ETT  Additional Equipment: Arterial line, CVP and Ultrasound Guidance Line Placement  Intra-op Plan:   Post-operative Plan: Extubation in OR  Informed Consent: I have reviewed the patients History and Physical, chart, labs and discussed the procedure including the risks, benefits and alternatives for the proposed anesthesia with the patient or authorized representative who has indicated his/her understanding and acceptance.   Dental advisory given  Plan Discussed with: CRNA and Surgeon  Anesthesia Plan Comments:         Anesthesia Quick Evaluation

## 2015-01-26 NOTE — Progress Notes (Addendum)
Comments:  Notified by RN that PCCM MD (Dr Oletta Darter) requested that [facility] provider notify CVTS s/p episode of hypotension 93/45 and pt report of SOB (RR-30-35 and 02 sats briefly to 88%). RN reports currently that BP has improved half way through a 500 cc IVF bolus to 131/67 and no longer feels SOB (RR-20 w/ 02 sats of 99% on 2L [place]). BBS diminished but otherwise CTA. Discussed pt w/ Dr Ayesha Rumpf w/ CVTS service. His recommendation given pt's stabile VS is to continue to monitor. Notify their service for any acute change in status otherwise they will see pt in am. Will continue to monitor closely on SDU.   Jeryl Columbia, NP-C [facility] Pager [phone]

## 2015-01-26 NOTE — Progress Notes (Signed)
[facility] Progress Note Patient Name: Maria Bauer DOB: 11/30/1945 MRN: [id number]   Date of Service  02/18/2015  HPI/Events of Note  Intubated and ventilated. Dr. Prescott Gum does not want to extubate the patient until tomorrow.   eICU Interventions  Will order a Propofol IV infusion for sedation while on mechanical ventilation.         Sommer,Steven Eugene 01/23/2015, 3:22 PM

## 2015-01-26 NOTE — Transfer of Care (Signed)
Immediate Anesthesia Transfer of Care Note  Patient: Maria Bauer  Procedure(s) Performed: Procedure(s): PERICARDIAL WINDOW (N/A) MEDIASTINOTOMY CHAMBERLAIN MCNEIL (Left)  Patient Location: SICU  Anesthesia Type:General  Level of Consciousness: sedated and Patient remains intubated per anesthesia plan  Airway & Oxygen Therapy: Patient remains intubated per anesthesia plan and Patient placed on Ventilator (see vital sign flow sheet for setting)  Post-op Assessment: Report given to RN and Post -op Vital signs reviewed and stable  Post vital signs: Reviewed and stable  Last Vitals:  Filed Vitals:   02/13/2015 0945  BP:   Pulse: 102  Temp:   Resp:     Complications: No apparent anesthesia complications

## 2015-01-26 NOTE — Brief Op Note (Signed)
02/21/2015 - 02/10/2015  2:05 PM  PATIENT:  Maria Bauer  69 y.o. female  PRE-OPERATIVE DIAGNOSIS:  Pericardial effusion, mediastinal mass  POST-OPERATIVE DIAGNOSIS:  Pericardial effusion, malignant thymoma  PROCEDURE:   SUBXIPHOID PERICARDIAL WINDOW MEDIASTINOTOMY (CHAMBERLAIN PROCEDURE)  SURGEON:  Ivin Poot, MD  ASSISTANT: Suzzanne Cloud, PA-C  ANESTHESIA:   general  SPECIMEN:  Source of Specimen:  Pericardial fluid, pericardial tissue, biopsy of mediastinal mass  DISPOSITION OF SPECIMEN:  Pathology  DRAINS: 28 Fr CTs x 2 (pericardial and left pleural)  FINDINGS: 550 ml bloody pericardial fluid drained  PATIENT CONDITION:  ICU - intubated and hemodynamically stable.

## 2015-01-26 NOTE — Progress Notes (Signed)
    Subjective:  A little worried. No syncope, no dizziness.   Objective:  Vital Signs in the last 24 hours: Temp:  [98 F (36.7 C)-98.3 F (36.8 C)] 98.3 F (36.8 C) (11/04 0400) Pulse Rate:  [58-97] 77 (11/03 2200) Resp:  [14-35] 18 (11/03 2200) BP: (93-156)/(45-97) 122/86 mmHg (11/04 0400) SpO2:  [88 %-99 %] 98 % (11/03 2200) Weight:  [213 lb 10 oz (96.9 kg)] 213 lb 10 oz (96.9 kg) (11/03 1621)  Intake/Output from previous day: 11/03 0701 - 11/04 0700 In: 500 [IV Piggyback:500] Out: 100 [Urine:100]   Physical Exam: General: Well developed, well nourished, in no acute distress. Head:  Normocephalic and atraumatic. Difficult JVD assessment Lungs: Clear to auscultation and percussion. Heart: Normal S1 and S2.  No murmur, rubs or gallops.  Abdomen: soft, non-tender, positive bowel sounds. Extremities: No clubbing or cyanosis. No edema. Neurologic: Alert and oriented x 3.    Lab Results:  Recent Labs  02/19/2015 2245 01/28/2015 0427  WBC 6.2 8.3  HGB 12.4 12.6  PLT 260 261    Recent Labs  01/29/2015 2245 02/07/2015 0427  NA 140 141  K 4.1 3.9  CL 107 105  CO2 26 25  GLUCOSE 105* 94  BUN 19 20  CREATININE 0.61 0.62   No results for input(s): TROPONINI in the last 72 hours.  Invalid input(s): CK, MB Hepatic Function Panel  Recent Labs  02/07/2015 0427  PROT 5.9*  ALBUMIN 3.5  AST 35  ALT 68*  ALKPHOS 57  BILITOT 0.9   No results for input(s): CHOL in the last 72 hours. No results for input(s): PROTIME in the last 72 hours.  Imaging: No results found. Personally viewed.   Telemetry: NSR Personally viewed.   EKG:  NSR low volt, TWI inf  Cardiac Studies:  Large effusion. CT chest: large central mass  Assessment/Plan:  Active Problems:   Pericardial effusion   Mediastinal mass   HTN (hypertension)   Rheumatoid arthritis (HCC)   Acute respiratory failure with hypoxia (HCC)   Pericardial effusion with cardiac tamponade   Cardiac tamponade  HLD (hyperlipidemia)   Pericardial effusion with impending tamponade  - Appreciate Dr. Darcey Nora team  - Pericardial window with bx planned  - ? Lymphoma  - BP stable now. No syncope.   - HR upper normal.  Nupur Hohman 01/25/2015, 8:17 AM

## 2015-01-26 NOTE — Progress Notes (Signed)
Name: Maria Bauer MRN: [id number] DOB: March 30, 1945    ADMISSION DATE:  02/19/2015  CONSULTATION DATE:  02/13/2015  REFERRING MD :  Dr. Harrington Challenger  CHIEF COMPLAINT:  Pericardial effusion  BRIEF PATIENT DESCRIPTION: 69 year old female with recent diagnosis of mediastinal mass, was seen by cardiothoracic surgery in the outpatient setting who referred her for an echocardiogram 11/3. Echocardiogram findings included large pericardial effusion. Due to persistent cough she is considered high risk for pericardiocentesis and requires admission for cough suppression prior to procedure. PCCM asked to see in consultation.  SIGNIFICANT EVENTS  10/27 evaluated in ED for cough. Prior CXR from outpatient noted with possible chest mass. 11/2 seen by Dr. Prescott Gum as outpatient regarding CT findings, appears to be non-resectable mass 11/3 presented for echo, large effusion found, admitted for drainage.  11/4 to OR for pericardial window and mediastinal mass biopsy  STUDIES:  CT chest (10/28) > 12.0 x 8.8 cm abnormal soft tissue density seen in anterior mediastinum concerning for adenopathy related to lymphoma or other metastatic disease. Some compression of the left innominate vein is noted as a result. 5 mm nodule is noted posteriorly in the right lower lobe. This may represent metastatic focus given above finding. Alternatively, it may be unrelated. Continued CT follow-up is recommended to ensure stability. Moderate pericardial effusion is noted as well. 11/3 echo> LVEF 55-60%, pericardium with large effusion, pericardial mass? Tamponade noted  SUBJECTIVE:   to OR for pericardial window and mediastinal mass biopsy  VITAL SIGNS: Temp:  [98 F (36.7 C)-98.3 F (36.8 C)] 98.2 F (36.8 C) (11/04 0802) Pulse Rate:  [58-97] 85 (11/04 0802) Resp:  [14-35] 20 (11/04 0802) BP: (93-156)/(45-97) 123/63 mmHg (11/04 0802) SpO2:  [88 %-100 %] 100 % (11/04 0802) Weight:  [213 lb 10 oz (96.9 kg)] 213 lb 10 oz (96.9 kg)  (11/03 1621)  PHYSICAL EXAMINATION: General:  Sedated on vent HENT: NCAT, ETT in place PULM: diminished breath sound on left, clear on right, vent supported breaths CV: RRR, distant, extremities well perfused GI: BS+, soft, nontender MSK: normal bulk, no bony deformities Neuro: sedated on vent   Recent Labs Lab 01/24/2015 2245 02/12/2015 0427  NA 140 141  K 4.1 3.9  CL 107 105  CO2 26 25  BUN 19 20  CREATININE 0.61 0.62  GLUCOSE 105* 94    Recent Labs Lab 02/09/2015 2245 02/06/2015 0427  HGB 12.4 12.6  HCT 39.5 39.9  WBC 6.2 8.3  PLT 260 261   No results found.   11/4 CXR images personally reviewed> cardiomegally, large mediastinal mass, chest tubes and ETT in good position  ASSESSMENT / PLAN:   PULMONARY A: Large pericardial effusion with mediastinal mass, now s/p pericardial window and mediastinal mass biopsy Cough Dyspnea pre op Large mediastinal mass - concern for lymphoma Pulmonary nodule - 35mm RLL Remains intubated post operatively> ABG OK Plan: Full vent support CXR again in AM Adjust PEEP/FIO2 to maintain O2 saturation > 90% Will add fentanyl for pain control Titrate propofol for RASS -1 per PAD protocol SBT in AM Chest tube management per thoracic surgery  CARDIOVASCULAR: Tamponade> likely due to malignant pericardial effusion now s/p pericardial window Plan: Follow up path  RENAL A: No acute issues P: Monitor BMET and UOP Replace electrolytes as needed  GI:  A: no acute issues P:  OG tube Pepcid for stress ulcer prophylaxis  ID:  A: No acute issues P: Monitor for fever  Neuro:  A: Post op pain P: Fentanyl prn  Propofol titrated to RASS -1  Family updated bedside by me   My cc time 35 minutes   Roselie Awkward, MD [facility] Pager: [phone] Cell: [phone] After 3pm or if no response, call [phone]

## 2015-01-26 NOTE — Progress Notes (Signed)
Utilization Review Completed.  

## 2015-01-26 NOTE — Progress Notes (Signed)
[facility] Progress Note Patient Name: Maria Bauer DOB: 1946/03/01 MRN: [id number]   Date of Service  02/10/2015  HPI/Events of Note  Request for bilateral wrist restraints.   eICU Interventions  Will order bilateral wrist restraints.      Intervention Category Minor Interventions: Agitation / anxiety - evaluation and management  Connelly Spruell [place] 02/21/2015, 4:03 PM

## 2015-01-26 NOTE — Progress Notes (Signed)
CT surgery p.m. 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

## 2015-01-26 NOTE — Progress Notes (Signed)
   01/23/2015 0052  Clinical Encounter Type  Visited With Patient and family together  Visit Type Initial  Referral From [place] responded to a request from the patient, patient's daughter, and nurse to offer support to the patient. Chaplain attempted to meet with patient, but patient was sleeping at that point. Chaplain will seek to follow-up, and our support is available as needed.   Jeri Lager, Chaplain 02/09/2015 12:54 AM

## 2015-01-27 ENCOUNTER — Inpatient Hospital Stay (HOSPITAL_COMMUNITY): Payer: Medicare (Managed Care)

## 2015-01-27 ENCOUNTER — Encounter (HOSPITAL_COMMUNITY): Payer: Self-pay | Admitting: *Deleted

## 2015-01-27 LAB — CBC
HEMATOCRIT: 36.1 % (ref 36.0–46.0)
HEMOGLOBIN: 11.3 g/dL — AB (ref 12.0–15.0)
MCH: 30.5 pg (ref 26.0–34.0)
MCHC: 31.3 g/dL (ref 30.0–36.0)
MCV: 97.3 fL (ref 78.0–100.0)
Platelets: 225 10*3/uL (ref 150–400)
RBC: 3.71 MIL/uL — AB (ref 3.87–5.11)
RDW: 14.7 % (ref 11.5–15.5)
WBC: 10.2 10*3/uL (ref 4.0–10.5)

## 2015-01-27 LAB — POCT I-STAT 3, ART BLOOD GAS (G3+)
Acid-Base Excess: 5 mmol/L — ABNORMAL HIGH (ref 0.0–2.0)
Acid-Base Excess: 3 mmol/L — ABNORMAL HIGH (ref 0.0–2.0)
BICARBONATE: 26.9 meq/L — AB (ref 20.0–24.0)
Bicarbonate: 27.5 mEq/L — ABNORMAL HIGH (ref 20.0–24.0)
O2 Saturation: 91 %
O2 Saturation: 92 %
PCO2 ART: 34.9 mmHg — AB (ref 35.0–45.0)
PCO2 ART: 39 mmHg (ref 35.0–45.0)
PH ART: 7.505 — AB (ref 7.350–7.450)
PH ART: 7.451 — AB (ref 7.350–7.450)
PO2 ART: 63 mmHg — AB (ref 80.0–100.0)
Patient temperature: 100.3
TCO2: 29 mmol/L (ref 0–100)
TCO2: 28 mmol/L (ref 0–100)
pO2, Arterial: 56 mmHg — ABNORMAL LOW (ref 80.0–100.0)

## 2015-01-27 LAB — BASIC METABOLIC PANEL
Anion gap: 9 (ref 5–15)
BUN: 11 mg/dL (ref 6–20)
CHLORIDE: 101 mmol/L (ref 101–111)
CO2: 25 mmol/L (ref 22–32)
CREATININE: 0.61 mg/dL (ref 0.44–1.00)
Calcium: 8.7 mg/dL — ABNORMAL LOW (ref 8.9–10.3)
GFR calc Af Amer: >60 mL/min (ref >60)
GFR calc non Af Amer: >60 mL/min (ref >60)
GLUCOSE: 171 mg/dL — AB (ref 65–99)
POTASSIUM: 3.4 mmol/L — AB (ref 3.5–5.1)
SODIUM: 135 mmol/L (ref 135–145)

## 2015-01-27 MED ORDER — CEFUROXIME SODIUM 1.5 G IJ SOLR
1.5000 g | Freq: Two times a day (BID) | INTRAMUSCULAR | Status: AC
  Administered 2015-01-27 – 2015-01-29 (×4): 1.5 g via INTRAVENOUS
  Filled 2015-01-27 (×4): qty 1.5

## 2015-01-27 MED ORDER — POTASSIUM CHLORIDE 10 MEQ/50ML IV SOLN
10.0000 meq | INTRAVENOUS | Status: AC
  Administered 2015-01-27 (×2): 10 meq via INTRAVENOUS
  Filled 2015-01-27 (×2): qty 50

## 2015-01-27 MED ORDER — FUROSEMIDE 10 MG/ML IJ SOLN
20.0000 mg | Freq: Two times a day (BID) | INTRAMUSCULAR | Status: DC
  Administered 2015-01-27 – 2015-01-29 (×4): 20 mg via INTRAVENOUS
  Filled 2015-01-27 (×7): qty 2

## 2015-01-27 MED ORDER — POTASSIUM CHLORIDE 10 MEQ/50ML IV SOLN
10.0000 meq | INTRAVENOUS | Status: AC | PRN
  Administered 2015-01-27 (×3): 10 meq via INTRAVENOUS
  Filled 2015-01-27: qty 50

## 2015-01-27 NOTE — Progress Notes (Signed)
Name: Maria Bauer MRN: [id number] DOB: June 01, 1945    ADMISSION DATE:  02/04/2015  CONSULTATION DATE:  02/19/2015  REFERRING MD :  Dr. Harrington Challenger  CHIEF COMPLAINT:  Pericardial effusion  BRIEF PATIENT DESCRIPTION: 69 year old female with recent diagnosis of mediastinal mass, was seen by cardiothoracic surgery in the outpatient setting who referred her for an echocardiogram 11/3. Echocardiogram findings included large pericardial effusion. Due to persistent cough she is considered high risk for pericardiocentesis and requires admission for cough suppression prior to procedure. PCCM asked to see in consultation.  SIGNIFICANT EVENTS  10/27 evaluated in ED for cough. Prior CXR from outpatient noted with possible chest mass. 11/2 seen by Dr. Prescott Gum as outpatient regarding CT findings, appears to be non-resectable mass 11/3 presented for echo, large effusion found, admitted for drainage.  11/4 to OR for pericardial window and mediastinal mass biopsy. 11/5 Remains intubated.   STUDIES:  CT chest (10/28) > 12.0 x 8.8 cm abnormal soft tissue density seen in anterior mediastinum concerning for adenopathy related to lymphoma or other metastatic disease. Some compression of the left innominate vein is noted as a result. 5 mm nodule is noted posteriorly in the right lower lobe. This may represent metastatic focus given above finding. Alternatively, it may be unrelated. Continued CT follow-up is recommended to ensure stability. Moderate pericardial effusion is noted as well. 11/3 echo> LVEF 55-60%, pericardium with large effusion, pericardial mass? Tamponade noted  SUBJECTIVE:  Sedated, intubated. No acute events overnight.   VITAL SIGNS: Temp:  [98.4 F (36.9 C)-101.1 F (38.4 C)] 100.8 F (38.2 C) (11/05 0805) Pulse Rate:  [93-114] 110 (11/05 0836) Resp:  [14-24] 23 (11/05 0836) BP: (91-161)/(41-81) 100/59 mmHg (11/05 0836) SpO2:  [94 %-100 %] 94 % (11/05 0836) Arterial Line BP: (90-167)/(57-82)  93/66 mmHg (11/05 0800) FiO2 (%):  [40 %-60 %] 40 % (11/05 0836)  PHYSICAL EXAMINATION: General:  Sedated on vent HENT: ETT tube in place.  PULM: Clear, diminished breath sounds. CV: RRR, distant, extremities well perfused GI: BS+, soft, nontender MSK: normal bulk, no bony deformities Neuro: sedated on vent   Recent Labs Lab 02/03/2015 0427 02/06/2015 1546 01/27/15 0400  NA 141 142 135  K 3.9 3.6 3.4*  CL 105 106 101  CO2 25 23 25   BUN 20 19 11   CREATININE 0.62 0.67 0.61  GLUCOSE 94 90 171*    Recent Labs Lab 02/19/2015 0427 01/30/2015 1546 01/27/15 0400  HGB 12.6 12.3 11.3*  HCT 39.9 39.4 36.1  WBC 8.3 10.1 10.2  PLT 261 175 225   Dg Chest Portable 1 View  01/28/2015  CLINICAL DATA:  Status post pericardial window and Chamberlain procedure for large mediastinal mass with associated pericardial effusion. EXAM: PORTABLE CHEST 1 VIEW COMPARISON:  CT of the chest on 01/19/2015 FINDINGS: Endotracheal tube present with the tip approximately 2.5 cm above the carina. Right jugular central line has been placed with the catheter tip in the SVC. Pericardial drain and left-sided chest tube present. Nasogastric tube extends into the distal esophagus and does not extend below the diaphragm. No pneumothorax identified. Lung volumes are very low bilaterally. Huge mediastinal mass again visualized which is more prominent to the left of midline. IMPRESSION: 1. No evidence of pneumothorax postoperatively. 2. Low lung volumes. 3. Nasogastric tube tip lies in the distal esophagus. Electronically Signed   By: Aletta Edouard M.D.   On: 01/25/2015 14:30     11/4 CXR images personally reviewed> cardiomegally, large mediastinal mass, chest tubes and ETT  in good position  ASSESSMENT / PLAN:   PULMONARY A: Large pericardial effusion with mediastinal mass, now s/p pericardial window and mediastinal mass biopsy Cough Dyspnea pre op Large mediastinal mass - concern for lymphoma Pulmonary nodule - 33mm  RLL Remains intubated post operatively. ABG 7.5/35/56/91%  Plan: Reduce TV and RR to adjust for alkalosis. Adjust PEEP/FIO2 to maintain O2 saturation > 90% Will add fentanyl for pain control Reduce sedation. SBT today. Chest tube management per thoracic surgery  CARDIOVASCULAR: Tamponade> likely due to malignant pericardial effusion now s/p pericardial window Plan: Follow up path  RENAL A: No acute issues P: Monitor BMET and UOP Replace electrolytes as needed  GI:  A: no acute issues P:  OG tube Pepcid for stress ulcer prophylaxis  ID:  A: No acute issues P: Monitor for fever  Neuro:  A: Post op pain P: Fentanyl prn Propofol titrated to RASS -1  No family at bedside.  Critical care time- 35 mins.  Marshell Garfinkel MD [facility] Pager [phone] If no answer or after 3pm call: [phone] 01/27/2015, 8:44 AM

## 2015-01-27 NOTE — Progress Notes (Addendum)
Initial Nutrition Assessment  DOCUMENTATION CODES:   Obesity unspecified  INTERVENTION:    If TF started, recommend Vital High Protein at 10 ml/hr with 60 ml Prostat liquid protein TID  TF regimen + Propofol infusion to provide 1455 kcals, 111 gm protein, 201 ml of free water  NUTRITION DIAGNOSIS:   Inadequate oral intake related to inability to eat as evidenced by NPO status  GOAL:   Provide needs based on ASPEN/SCCM guidelines  MONITOR:   Vent status, Diet advancement, Labs, Weight trends, I & O's  REASON FOR ASSESSMENT:   Ventilator  ASSESSMENT:   69 yo Female with PMH of scoliosis, HTN, anxiety; admitted with pericardial effusion, mediastinal mass.   Patient s/p procedure 11/4: SUBXIPHOID PERICARDIAL WINDOW MEDIASTINOTOMY   Patient is currently intubated on ventilator support -- OGT in place MV: 12.3  L/min Temp (24hrs), Avg:99.9 F (37.7 C), Min:98.4 F (36.9 C), Max:101.1 F (38.4 C)   Propofol: 23.3 ml/hr ----> 615 fat kcals    RD unable to complete Nutrition Focused Physical Exam at this time.  Diet Order:  Diet NPO time specified  Skin:  Reviewed, no issues  Last BM:  11/3  Height:   Ht Readings from Last 1 Encounters:  02/13/2015 5\' 4"  (1.626 m)    Weight:   Wt Readings from Last 1 Encounters:  02/18/2015 213 lb 10 oz (96.9 kg)    Ideal Body Weight:  54.5 kg  BMI:  Body mass index is 36.65 kg/(m^2).  Estimated Nutritional Needs:   Kcal:  6546-5035  Protein:  110-120 gm  Fluid:  per MD  EDUCATION NEEDS:   No education needs identified at this time  Arthur Holms, RD, LDN Pager #: [phone] After-Hours Pager #: [phone]

## 2015-01-27 NOTE — Op Note (Signed)
NAMEPAHOUA, SCHREINER                 ACCOUNT NO.:  [id number]  MEDICAL RECORD NO.:  [id number]  LOCATION:  2S12C                        FACILITY:  [facility]  PHYSICIAN:  Ivin Poot, M.D.  DATE OF BIRTH:  March 11, 1946  DATE OF PROCEDURE:  01/23/2015 DATE OF DISCHARGE:                              OPERATIVE REPORT   OPERATION: 1. Subxiphoid pericardial window. 2. Left anterior thoracotomy (Chamberlain procedure) with biopsy of     mediastinal mass.  SURGEON:  Ivin Poot, M.D.  ASSISTANT:  Suzzanne Cloud, PA-C.  ANESTHESIA:  General by Dr. Gerald Stabs Mojher.  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  CLINICAL NOTE:  The patient is a 69 year old [place] female, recently moved to this region from the [facility].  She has a history of malignant melanoma resected from her left arm 3 years ago.  She was initially evaluated by her primary physician for severe coughing, shortness of breath, and orthopnea, and a CT demonstrated a large mediastinal mass, 12-15 cm in size with invasion of the mediastinal vessels.  She also had a pericardial effusion noted on the CT scan. After an echocardiogram the next day, she was admitted for a large pericardial effusion measuring 3 cm in diameter.  It is associated with RV compression.  It was evident the patient needed drainage of the large pericardial effusion as well as biopsy of the mediastinal mass.  I saw the patient in the hospital the morning of her operation and discussed the role of subxiphoid pericardial window and left anterior thoracotomy for biopsy for treatment of her large pericardial effusion and to obtain diagnosis to direct therapy for her large invasive mediastinal mass.  I discussed the plan of surgery to  include general anesthesia, location of the surgical incisions, expected postoperative recovery, postoperative chest tube placement, therapy for the pleural space and the pericardial space.  I discussed the risks of bleeding, infection, prolonged ventilator dependence, multisystem failure, and death.  The patient demonstrated her understanding and agreed to proceed with surgery under what I felt was an informed consent.  OPERATIVE FINDINGS: 1. 600 mL of dark bloody pericardial effusion - cytology is pending. 2. 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  OPERATIVE PROCEDURE:  The patient was brought to the operating room and placed supine on the operating table.  After the patient had been prepared in the preoperative area and informed consent and proper site and side were documented.  The anesthesiologist attempted to pass a TEE probe, but it would not pass and this was not performed.  The patient was placed supine on the OR table and the chest and upper abdomen was prepped and draped as a sterile field.  A proper time-out was performed.  A small incision was made centered on the xiphoid.  This was taken down through the deep layer of fat and the patient was morbidly obese.  The fascia was  divided and the xiphoid was excised and some of the distal sternum was removed with rongeurs in order to access the pericardial space.  The sternal elevating retractor was placed and the soft tissue anterior to the pericardium was dissected away.  An incision was made in the anterior pericardial membrane and bloody fluid under pressure immediately was drained and this was sent for Cytology. The window was created approximately 3 cm in diameter and the pericardial tissue was excised and submitted for Pathology.  All the dark bloody fluid was drained.  The pericardial space was irrigated  with warm saline.  The anterior surface of the heart had some thickening and induration and no tumor nodules were grossly noted.  A 28-French tube was placed in the pericardial space and brought out through separate incision.  The retractor was removed.  The fascia was closed with interrupted Vicryl.  The subcutaneous layer was closed in running Vicryl and the skin was closed with a subcuticular Vicryl.  Attention was then directed to the anterior thoracotomy.  It was decided to proceed with an open biopsy since a pericardial frozen section showed no evidence of cancer.  The incision was made in the third interspace anteriorly and taken down to the rib.  The pleural space was entered.  A large mediastinal mass was immediately present abutting up against the rib.  Using a 15-blade scalpel, a large excisional biopsy was taken of this.  Part was sent for frozen section.  The report returned consistent with thymoma.  Bleeding was controlled with the cautery and topical hemostatic agents.  The pleural space was drained with a 28-French chest tube placed through a separate incision and connected to a Pleur-Evac.  The incision was closed with interrupted #1 Vicryl for the muscle layer.  The subcutaneous and skin layers were closed in running Vicryl.  Sterile dressings were applied.  The patient was kept intubated and transported to the ICU by the Anesthesia Team.  Both chest tubes were connected to separate Pleur-Evac drainage chambers.     Ivin Poot, M.D.     PV/MEDQ  D:  02/08/2015  T:  01/27/2015  Job:  [id number]  cc:   Primary care physician

## 2015-01-27 NOTE — Progress Notes (Signed)
1 Day Post-Op Procedure(s) (LRB): PERICARDIAL WINDOW (N/A) MEDIASTINOTOMY CHAMBERLAIN MCNEIL (Left) Subjective: Remains intubated after emergency  pericardial window and biopsy of 12-14 centimeter anterior mediastinal mass with respiratory distress.  Chest x-ray with large central mass but no evidence of edema or pleural effusion, chest tube in good position without pneumothorax  Patient had SBT but failed thresholds for extubation because of low oxygen saturation. Currently sedated and stable on ventilator Cytologies from pericardial fluid  and histology from mediastinal mass are pending. 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 Objective: Vital signs in last 24 hours: Temp:  [98.4 F (36.9 C)-101.1 F (38.4 C)] 100.4 F (38 C) (11/05 1211) Pulse Rate:  [93-124] 106 (11/05 1201) Cardiac Rhythm:  [-] Sinus tachycardia (11/05 0800) Resp:  [14-28] 26 (11/05 1201) BP: (89-161)/(41-81) 99/56 mmHg (11/05 1201) SpO2:  [93 %-100 %] 96 % (11/05 1201) Arterial Line BP: (78-167)/(57-82) 94/57 mmHg (11/05 1000) FiO2 (%):  [40 %-60 %] 60 % (11/05 1201)  Hemodynamic parameters for last 24 hours:   sinus rhythm stable blood pressure low-grade temperature elevation  Intake/Output from previous day: 11/04 0701 - 11/05 0700 In: 3282.1 [I.V.:2962.1; NG/GT:120; IV Piggyback:200] Out: 1925 [Urine:1230; Blood:75; Chest Tube:620] Intake/Output this shift: Total I/O In: 471.7 [I.V.:291.7; NG/GT:30; IV Piggyback:150] Out: 690 [Urine:400; Emesis/NG output:200; Chest Tube:90]  Sedated on ventilator Clear breath sounds No airleak and minimal drainage from pericardial and left pleural chest tubes  Lab Results:  Recent Labs  01/25/2015 1546 01/27/15 0400  WBC 10.1 10.2  HGB 12.3 11.3*  HCT 39.4 36.1  PLT 175 225   BMET:  Recent Labs  01/29/2015 1546 01/27/15 0400  NA 142 135  K 3.6 3.4*  CL 106 101  CO2 23 25  GLUCOSE 90 171*  BUN 19 11  CREATININE 0.67 0.61  CALCIUM  10.3 8.7*    PT/INR:  Recent Labs  01/23/2015 1546  LABPROT 15.7*  INR 1.24   ABG    Component Value Date/Time   PHART 7.451* 01/27/2015 0941   HCO3 26.9* 01/27/2015 0941   TCO2 28 01/27/2015 0941   ACIDBASEDEF 0.6 02/20/2015 1725   O2SAT 92.0 01/27/2015 0941   CBG (last 3)  No results for input(s): GLUCAP in the last 72 hours.  Assessment/Plan: S/P Procedure(s) (LRB): PERICARDIAL WINDOW (N/A) MEDIASTINOTOMY CHAMBERLAIN MCNEIL (Left) Hold anticoagulation due to hemorrhagic pericardial effusion-pericarditis  Vent wean per CCM-hold on ventilator today Patient will need to start tube feeds tomorrow if she is not extubated.   LOS: 2 days    Tharon Aquas Trigt III 01/27/2015

## 2015-01-27 NOTE — Progress Notes (Signed)
RT decreased FIO2 to 40% for MD request. ABG requested.

## 2015-01-27 NOTE — Progress Notes (Signed)
CT surgery p.m. Rounds  Patient remains sedated on ventilator Attempt at extubation unsuccessful because of low oxygenation Will dose with Lasix 20 mg IV this p.m. Cytology of pericardial fluid and final pathology on mediastinal mass pending

## 2015-01-28 ENCOUNTER — Inpatient Hospital Stay (HOSPITAL_COMMUNITY): Payer: Medicare (Managed Care)

## 2015-01-28 LAB — COMPREHENSIVE METABOLIC PANEL
ALBUMIN: 2.2 g/dL — AB (ref 3.5–5.0)
ALK PHOS: 43 U/L (ref 38–126)
ALT: 32 U/L (ref 14–54)
ANION GAP: 8 (ref 5–15)
AST: 34 U/L (ref 15–41)
BILIRUBIN TOTAL: 0.8 mg/dL (ref 0.3–1.2)
BUN: 6 mg/dL (ref 6–20)
CALCIUM: 8.3 mg/dL — AB (ref 8.9–10.3)
CO2: 25 mmol/L (ref 22–32)
Chloride: 109 mmol/L (ref 101–111)
Creatinine, Ser: 0.6 mg/dL (ref 0.44–1.00)
GLUCOSE: 128 mg/dL — AB (ref 65–99)
POTASSIUM: 3.7 mmol/L (ref 3.5–5.1)
Sodium: 142 mmol/L (ref 135–145)
TOTAL PROTEIN: 4.6 g/dL — AB (ref 6.5–8.1)

## 2015-01-28 LAB — CBC
HEMATOCRIT: 36.3 % (ref 36.0–46.0)
Hemoglobin: 11.5 g/dL — ABNORMAL LOW (ref 12.0–15.0)
MCH: 30.6 pg (ref 26.0–34.0)
MCHC: 31.7 g/dL (ref 30.0–36.0)
MCV: 96.5 fL (ref 78.0–100.0)
Platelets: 196 10*3/uL (ref 150–400)
RBC: 3.76 MIL/uL — ABNORMAL LOW (ref 3.87–5.11)
RDW: 14.9 % (ref 11.5–15.5)
WBC: 9.5 10*3/uL (ref 4.0–10.5)

## 2015-01-28 LAB — POCT I-STAT, CHEM 8
BUN: 7 mg/dL (ref 6–20)
Calcium, Ion: 1.15 mmol/L (ref 1.13–1.30)
Chloride: 108 mmol/L (ref 101–111)
Creatinine, Ser: 0.4 mg/dL — ABNORMAL LOW (ref 0.44–1.00)
Glucose, Bld: 151 mg/dL — ABNORMAL HIGH (ref 65–99)
HEMATOCRIT: 39 % (ref 36.0–46.0)
HEMOGLOBIN: 13.3 g/dL (ref 12.0–15.0)
POTASSIUM: 4.2 mmol/L (ref 3.5–5.1)
SODIUM: 142 mmol/L (ref 135–145)
TCO2: 20 mmol/L (ref 0–100)

## 2015-01-28 LAB — GLUCOSE, CAPILLARY: Glucose-Capillary: 103 mg/dL — ABNORMAL HIGH (ref 65–99)

## 2015-01-28 LAB — TRIGLYCERIDES: TRIGLYCERIDES: 65 mg/dL (ref <150)

## 2015-01-28 MED ORDER — DEXMEDETOMIDINE HCL IN NACL 200 MCG/50ML IV SOLN
0.4000 ug/kg/h | INTRAVENOUS | Status: DC
  Administered 2015-01-28 (×2): 0.8 ug/kg/h via INTRAVENOUS
  Administered 2015-01-28: 0.5 ug/kg/h via INTRAVENOUS
  Filled 2015-01-28 (×4): qty 50

## 2015-01-28 MED ORDER — ALBUMIN HUMAN 25 % IV SOLN
12.5000 g | Freq: Four times a day (QID) | INTRAVENOUS | Status: AC
  Administered 2015-01-28 (×3): 12.5 g via INTRAVENOUS
  Filled 2015-01-28 (×3): qty 50

## 2015-01-28 MED ORDER — METOCLOPRAMIDE HCL 5 MG/ML IJ SOLN
10.0000 mg | Freq: Four times a day (QID) | INTRAMUSCULAR | Status: DC
  Administered 2015-01-28 – 2015-02-04 (×27): 10 mg via INTRAVENOUS
  Filled 2015-01-28 (×37): qty 2

## 2015-01-28 MED ORDER — POTASSIUM CHLORIDE 10 MEQ/50ML IV SOLN
10.0000 meq | INTRAVENOUS | Status: AC
  Administered 2015-01-28 (×3): 10 meq via INTRAVENOUS
  Filled 2015-01-28: qty 50

## 2015-01-28 MED ORDER — PROPOFOL 1000 MG/100ML IV EMUL
0.0000 ug/kg/min | INTRAVENOUS | Status: DC
Start: 2015-01-28 — End: 2015-01-28

## 2015-01-28 MED ORDER — PRO-STAT SUGAR FREE PO LIQD
60.0000 mL | Freq: Three times a day (TID) | ORAL | Status: DC
  Administered 2015-01-28 – 2015-01-30 (×6): 60 mL via ORAL
  Filled 2015-01-28 (×9): qty 60

## 2015-01-28 MED ORDER — VITAL HIGH PROTEIN PO LIQD
1000.0000 mL | ORAL | Status: DC
  Administered 2015-01-28: 1000 mL
  Administered 2015-01-29 (×4)
  Administered 2015-01-29: 1000 mL
  Administered 2015-01-29: 11:00:00
  Filled 2015-01-28 (×5): qty 1000

## 2015-01-28 MED ORDER — INSULIN ASPART 100 UNIT/ML ~~LOC~~ SOLN
0.0000 [IU] | SUBCUTANEOUS | Status: DC
  Administered 2015-01-29 – 2015-01-30 (×2): 3 [IU] via SUBCUTANEOUS
  Administered 2015-01-30: 7 [IU] via SUBCUTANEOUS
  Administered 2015-01-30: 3 [IU] via SUBCUTANEOUS
  Administered 2015-01-31 – 2015-02-01 (×8): 4 [IU] via SUBCUTANEOUS
  Administered 2015-02-01: 3 [IU] via SUBCUTANEOUS
  Administered 2015-02-01: 4 [IU] via SUBCUTANEOUS
  Administered 2015-02-01: 7 [IU] via SUBCUTANEOUS
  Administered 2015-02-01: 3 [IU] via SUBCUTANEOUS
  Administered 2015-02-02 (×2): 4 [IU] via SUBCUTANEOUS
  Administered 2015-02-02: 11 [IU] via SUBCUTANEOUS
  Administered 2015-02-02: 4 [IU] via SUBCUTANEOUS
  Administered 2015-02-02: 7 [IU] via SUBCUTANEOUS
  Administered 2015-02-02: 4 [IU] via SUBCUTANEOUS
  Administered 2015-02-03: 7 [IU] via SUBCUTANEOUS
  Administered 2015-02-03 (×2): 4 [IU] via SUBCUTANEOUS
  Administered 2015-02-03: 7 [IU] via SUBCUTANEOUS
  Administered 2015-02-03 (×2): 4 [IU] via SUBCUTANEOUS
  Administered 2015-02-04: 3 [IU] via SUBCUTANEOUS
  Administered 2015-02-04: 7 [IU] via SUBCUTANEOUS
  Administered 2015-02-04: 4 [IU] via SUBCUTANEOUS
  Administered 2015-02-04: 11 [IU] via SUBCUTANEOUS
  Administered 2015-02-04: 4 [IU] via SUBCUTANEOUS
  Administered 2015-02-04: 7 [IU] via SUBCUTANEOUS
  Administered 2015-02-05: 11 [IU] via SUBCUTANEOUS
  Administered 2015-02-05: 7 [IU] via SUBCUTANEOUS
  Administered 2015-02-05: 11 [IU] via SUBCUTANEOUS
  Administered 2015-02-05 (×3): 7 [IU] via SUBCUTANEOUS
  Administered 2015-02-05: 11 [IU] via SUBCUTANEOUS
  Administered 2015-02-06 (×6): 7 [IU] via SUBCUTANEOUS
  Administered 2015-02-07 – 2015-02-08 (×7): 4 [IU] via SUBCUTANEOUS
  Administered 2015-02-08: 3 [IU] via SUBCUTANEOUS
  Administered 2015-02-08: 7 [IU] via SUBCUTANEOUS
  Administered 2015-02-08 – 2015-02-09 (×3): 4 [IU] via SUBCUTANEOUS
  Administered 2015-02-09 – 2015-02-14 (×8): 3 [IU] via SUBCUTANEOUS

## 2015-01-28 MED ORDER — DEXTROSE 5 % IV SOLN
0.0000 ug/min | INTRAVENOUS | Status: DC
  Administered 2015-01-28: 35 ug/min via INTRAVENOUS
  Administered 2015-01-28: 10 ug/min via INTRAVENOUS
  Administered 2015-01-29: 28 ug/min via INTRAVENOUS
  Filled 2015-01-28 (×4): qty 1

## 2015-01-28 MED ORDER — POTASSIUM CHLORIDE 10 MEQ/50ML IV SOLN
10.0000 meq | INTRAVENOUS | Status: AC
  Administered 2015-01-28 (×2): 10 meq via INTRAVENOUS

## 2015-01-28 NOTE — Progress Notes (Signed)
CT surgery p.m. Rounds  Remains on ventilator with coarse breath sounds Receiving Lasix with good diuresis Trickle tube feeds with high protein Vital started 10 cc/h On low-dose phenylephrine to maintain systolic blood pressure rhythm 90 mmHg P.m. labs reviewed--hemoglobin 13, potassium 4.2, creatinine 0.4, glucose 151

## 2015-01-28 NOTE — Care Management Important Message (Signed)
Important Message  Patient Details  Name: Maria Bauer MRN: [id number] Date of Birth: 1945/06/05   Medicare Important Message Given:  Yes-second notification given    Nathen May 01/28/2015, 10:04 AM

## 2015-01-28 NOTE — Progress Notes (Signed)
2 Days Post-Op Procedure(s) (LRB): PERICARDIAL WINDOW (N/A) MEDIASTINOTOMY CHAMBERLAIN MCNEIL (Left) Subjective: Remains on ventilator, chest x-ray with increased bilateral airspace disease today.  Total drainage from pericardial and left pleural drains yesterday  440 mL  Tracheal aspirate with negative growth, low-grade fever 38.1 Phenylephrine drip started to maintain systolic blood pressure greater than 90 on significant sedation Objective: Vital signs in last 24 hours: Temp:  [99 F (37.2 C)-100.6 F (38.1 C)] 99.9 F (37.7 C) (11/06 0900) Pulse Rate:  [88-113] 102 (11/06 1000) Cardiac Rhythm:  [-] Normal sinus rhythm (11/06 0800) Resp:  [20-30] 22 (11/06 1000) BP: (76-118)/(39-78) 83/67 mmHg (11/06 1000) SpO2:  [96 %-100 %] 97 % (11/06 1000) Arterial Line BP: (70-114)/(51-71) 83/59 mmHg (11/06 1000) FiO2 (%):  [50 %-60 %] 50 % (11/06 0800) Weight:  [207 lb 10.8 oz (94.2 kg)] 207 lb 10.8 oz (94.2 kg) (11/06 0400)  Hemodynamic parameters for last 24 hours:   sinus rhythm  Intake/Output from previous day: 11/05 0701 - 11/06 0700 In: 3364.9 [I.V.:2794.9; NG/GT:220; IV Piggyback:350] Out: 7494 [Urine:3010; Emesis/NG output:500; Chest Tube:440] Intake/Output this shift: Total I/O In: 375 [I.V.:245; NG/GT:30; IV Piggyback:100] Out: 145 [Urine:75; Emesis/NG output:50; Chest Tube:20]  Coarse breath sounds bilaterally Sinus rhythm No air leak from chest tubes  Lab Results:  Recent Labs  01/27/15 0400 01/28/15 0424  WBC 10.2 9.5  HGB 11.3* 11.5*  HCT 36.1 36.3  PLT 225 196   BMET:  Recent Labs  01/27/15 0400 01/28/15 0424  NA 135 142  K 3.4* 3.7  CL 101 109  CO2 25 25  GLUCOSE 171* 128*  BUN 11 6  CREATININE 0.61 0.60  CALCIUM 8.7* 8.3*    PT/INR:  Recent Labs  02/03/2015 1546  LABPROT 15.7*  INR 1.24   ABG    Component Value Date/Time   PHART 7.451* 01/27/2015 0941   HCO3 26.9* 01/27/2015 0941   TCO2 28 01/27/2015 0941   ACIDBASEDEF 0.6  02/17/2015 1725   O2SAT 92.0 01/27/2015 0941   CBG (last 3)  No results for input(s): GLUCAP in the last 72 hours.  Assessment/Plan: S/P Procedure(s) (LRB): PERICARDIAL WINDOW (N/A) MEDIASTINOTOMY CHAMBERLAIN MCNEIL (Left) Leave chest tubes in place while on ventilator Start trickle tube feeds 10 cc/h, Reglan every 6 hours Vent wean-excavation per CCM Follow-up pathology of large mediastinal mass biopsy   LOS: 3 days    Tharon Aquas Trigt III 01/28/2015

## 2015-01-28 NOTE — Progress Notes (Signed)
Patient put back on full vent support due to increased RR, HR, and agitation.

## 2015-01-28 NOTE — Progress Notes (Signed)
Name: Maria Bauer MRN: [id number] DOB: 03/10/1946    ADMISSION DATE:  02/17/2015  CONSULTATION DATE:  02/07/2015  REFERRING MD :  Dr. Harrington Challenger  CHIEF COMPLAINT:  Pericardial effusion  BRIEF PATIENT DESCRIPTION: 69 year old female with recent diagnosis of mediastinal mass, was seen by cardiothoracic surgery in the outpatient setting who referred her for an echocardiogram 11/3. Echocardiogram findings included large pericardial effusion. Due to persistent cough she is considered high risk for pericardiocentesis and requires admission for cough suppression prior to procedure. PCCM asked to see in consultation.  SIGNIFICANT EVENTS  10/27 evaluated in ED for cough. Prior CXR from outpatient noted with possible chest mass. 11/2 seen by Dr. Prescott Gum as outpatient regarding CT findings, appears to be non-resectable mass 11/3 presented for echo, large effusion found, admitted for drainage.  11/4 to OR for pericardial window and mediastinal mass biopsy. 11/6 Remains intubated due to hypoxemia.  STUDIES:  CT chest (10/28) > 12.0 x 8.8 cm abnormal soft tissue density seen in anterior mediastinum concerning for adenopathy related to lymphoma or other metastatic disease. Some compression of the left innominate vein is noted as a result. 5 mm nodule is noted posteriorly in the right lower lobe. This may represent metastatic focus given above finding. Alternatively, it may be unrelated. Continued CT follow-up is recommended to ensure stability. Moderate pericardial effusion is noted as well. 11/3 echo> LVEF 55-60%, pericardium with large effusion, pericardial mass? Tamponade noted  SUBJECTIVE:  Sedated, intubated. Unable to be extubated due to hypoxemia. Started on lasix diuresis.  VITAL SIGNS: Temp:  [99 F (37.2 C)-100.6 F (38.1 C)] 99.9 F (37.7 C) (11/06 0737) Pulse Rate:  [88-124] 99 (11/06 0737) Resp:  [20-30] 20 (11/06 0737) BP: (76-118)/(39-69) 97/55 mmHg (11/06 0737) SpO2:  [93 %-100 %] 96  % (11/06 0737) Arterial Line BP: (70-114)/(51-71) 71/56 mmHg (11/06 0700) FiO2 (%):  [40 %-60 %] 50 % (11/06 0737) Weight:  [207 lb 10.8 oz (94.2 kg)] 207 lb 10.8 oz (94.2 kg) (11/06 0400)  PHYSICAL EXAMINATION: General:  Sedated on vent HENT: ETT tube in place.  PULM: Clear, diminished breath sounds. CV: RRR, distant, extremities well perfused GI: BS+, soft, nontender MSK: normal bulk, no bony deformities Neuro: sedated on vent   Recent Labs Lab 02/10/2015 1546 01/27/15 0400 01/28/15 0424  NA 142 135 142  K 3.6 3.4* 3.7  CL 106 101 109  CO2 23 25 25   BUN 19 11 6   CREATININE 0.67 0.61 0.60  GLUCOSE 90 171* 128*    Recent Labs Lab 01/24/2015 1546 01/27/15 0400 01/28/15 0424  HGB 12.3 11.3* 11.5*  HCT 39.4 36.1 36.3  WBC 10.1 10.2 9.5  PLT 175 225 196   Dg Chest Port 1 View  01/27/2015  CLINICAL DATA:  Pericardial effusion EXAM: PORTABLE CHEST 1 VIEW COMPARISON:  02/09/2015 FINDINGS: ET tube tip is stable above the carina. There is a right IJ catheter with tip in the projection of the SVC. Left chest tube is in place. No pneumothorax. NG tube tip is in the stomach. Cardiac enlargement and large anterior mediastinal mass is again noted. Unchanged from previous exam. Increase in pleural fluid noted bilaterally. IMPRESSION: 1. Stable support apparatus. 2. Left chest tube without pneumothorax. 3. Increase in bilateral pleural effusions. Electronically Signed   By: Kerby Moors M.D.   On: 01/27/2015 09:00   Dg Chest Portable 1 View  01/25/2015  CLINICAL DATA:  Status post pericardial window and Chamberlain procedure for large mediastinal mass with associated pericardial  effusion. EXAM: PORTABLE CHEST 1 VIEW COMPARISON:  CT of the chest on 01/19/2015 FINDINGS: Endotracheal tube present with the tip approximately 2.5 cm above the carina. Right jugular central line has been placed with the catheter tip in the SVC. Pericardial drain and left-sided chest tube present. Nasogastric tube  extends into the distal esophagus and does not extend below the diaphragm. No pneumothorax identified. Lung volumes are very low bilaterally. Huge mediastinal mass again visualized which is more prominent to the left of midline. IMPRESSION: 1. No evidence of pneumothorax postoperatively. 2. Low lung volumes. 3. Nasogastric tube tip lies in the distal esophagus. Electronically Signed   By: Aletta Edouard M.D.   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  Plan: Adjust PEEP/FIO2 to maintain O2 saturation > 90% Fentanyl for pain control Reduce sedation. Oxygenation is improving after lasix. Repeat SBT today. 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 Will switch sedation to precedex.  No family at bedside.  Critical care time- 35 mins.  Marshell Garfinkel MD [facility] Pager [phone] If no answer or after 3pm call: [phone] 01/28/2015, 8:28 AM

## 2015-01-29 ENCOUNTER — Inpatient Hospital Stay (HOSPITAL_COMMUNITY): Payer: Medicare (Managed Care)

## 2015-01-29 ENCOUNTER — Encounter (HOSPITAL_COMMUNITY): Payer: Self-pay | Admitting: Cardiothoracic Surgery

## 2015-01-29 DIAGNOSIS — R451 Restlessness and agitation: Secondary | ICD-10-CM

## 2015-01-29 LAB — POCT I-STAT, CHEM 8
BUN: 15 mg/dL (ref 6–20)
CALCIUM ION: 1.22 mmol/L (ref 1.13–1.30)
CHLORIDE: 112 mmol/L — AB (ref 101–111)
Creatinine, Ser: 0.5 mg/dL (ref 0.44–1.00)
GLUCOSE: 134 mg/dL — AB (ref 65–99)
HCT: 36 % (ref 36.0–46.0)
Hemoglobin: 12.2 g/dL (ref 12.0–15.0)
Potassium: 4.6 mmol/L (ref 3.5–5.1)
Sodium: 144 mmol/L (ref 135–145)
TCO2: 22 mmol/L (ref 0–100)

## 2015-01-29 LAB — C DIFFICILE QUICK SCREEN W PCR REFLEX
C DIFFICILE (CDIFF) INTERP: NEGATIVE
C DIFFICILE (CDIFF) TOXIN: NEGATIVE
C Diff antigen: NEGATIVE

## 2015-01-29 LAB — BASIC METABOLIC PANEL
Anion gap: 9 (ref 5–15)
BUN: 13 mg/dL (ref 6–20)
CO2: 21 mmol/L — ABNORMAL LOW (ref 22–32)
Calcium: 8.2 mg/dL — ABNORMAL LOW (ref 8.9–10.3)
Chloride: 111 mmol/L (ref 101–111)
Creatinine, Ser: 0.48 mg/dL (ref 0.44–1.00)
GFR calc Af Amer: >60 mL/min (ref >60)
GFR calc non Af Amer: >60 mL/min (ref >60)
Glucose, Bld: 124 mg/dL — ABNORMAL HIGH (ref 65–99)
Potassium: 3.5 mmol/L (ref 3.5–5.1)
Sodium: 141 mmol/L (ref 135–145)

## 2015-01-29 LAB — POCT I-STAT 3, ART BLOOD GAS (G3+)
ACID-BASE EXCESS: 3 mmol/L — AB (ref 0.0–2.0)
Acid-base deficit: 1 mmol/L (ref 0.0–2.0)
BICARBONATE: 25.5 meq/L — AB (ref 20.0–24.0)
BICARBONATE: 22.5 meq/L (ref 20.0–24.0)
O2 SAT: 97 %
O2 Saturation: 96 %
PH ART: 7.501 — AB (ref 7.350–7.450)
PO2 ART: 86 mmHg (ref 80.0–100.0)
Patient temperature: 37.2
TCO2: 26 mmol/L (ref 0–100)
TCO2: 24 mmol/L (ref 0–100)
pCO2 arterial: 32.7 mmHg — ABNORMAL LOW (ref 35.0–45.0)
pCO2 arterial: 33.6 mmHg — ABNORMAL LOW (ref 35.0–45.0)
pH, Arterial: 7.436 (ref 7.350–7.450)
pO2, Arterial: 78 mmHg — ABNORMAL LOW (ref 80.0–100.0)

## 2015-01-29 LAB — GLUCOSE, CAPILLARY
GLUCOSE-CAPILLARY: 102 mg/dL — AB (ref 65–99)
GLUCOSE-CAPILLARY: 114 mg/dL — AB (ref 65–99)
GLUCOSE-CAPILLARY: 117 mg/dL — AB (ref 65–99)
Glucose-Capillary: 104 mg/dL — ABNORMAL HIGH (ref 65–99)
Glucose-Capillary: 110 mg/dL — ABNORMAL HIGH (ref 65–99)
Glucose-Capillary: 128 mg/dL — ABNORMAL HIGH (ref 65–99)

## 2015-01-29 LAB — CBC
HCT: 36.4 % (ref 36.0–46.0)
Hemoglobin: 11.5 g/dL — ABNORMAL LOW (ref 12.0–15.0)
MCH: 30.7 pg (ref 26.0–34.0)
MCHC: 31.6 g/dL (ref 30.0–36.0)
MCV: 97.1 fL (ref 78.0–100.0)
Platelets: 207 10*3/uL (ref 150–400)
RBC: 3.75 MIL/uL — ABNORMAL LOW (ref 3.87–5.11)
RDW: 15.1 % (ref 11.5–15.5)
WBC: 12.2 10*3/uL — ABNORMAL HIGH (ref 4.0–10.5)

## 2015-01-29 LAB — CULTURE, RESPIRATORY W GRAM STAIN
Culture: NO GROWTH
Special Requests: NORMAL

## 2015-01-29 MED ORDER — PHENYLEPHRINE HCL 10 MG/ML IJ SOLN
0.0000 ug/min | INTRAVENOUS | Status: DC
  Administered 2015-01-29: 10 ug/min via INTRAVENOUS
  Administered 2015-01-30: 45 ug/min via INTRAVENOUS
  Filled 2015-01-29 (×4): qty 4

## 2015-01-29 MED ORDER — POTASSIUM CHLORIDE 20 MEQ/15ML (10%) PO SOLN
40.0000 meq | Freq: Three times a day (TID) | ORAL | Status: AC
  Administered 2015-01-29 (×2): 40 meq
  Filled 2015-01-29 (×3): qty 30

## 2015-01-29 MED ORDER — DEXTROSE 5 % IV SOLN
1.5000 g | Freq: Two times a day (BID) | INTRAVENOUS | Status: AC
  Administered 2015-01-29 – 2015-01-30 (×4): 1.5 g via INTRAVENOUS
  Filled 2015-01-29 (×4): qty 1.5

## 2015-01-29 MED ORDER — FUROSEMIDE 10 MG/ML IJ SOLN
40.0000 mg | Freq: Three times a day (TID) | INTRAMUSCULAR | Status: AC
Start: 2015-01-29 — End: 2015-01-29
  Administered 2015-01-29 (×2): 40 mg via INTRAVENOUS
  Filled 2015-01-29 (×2): qty 4

## 2015-01-29 MED ORDER — POTASSIUM CHLORIDE 10 MEQ/50ML IV SOLN
10.0000 meq | INTRAVENOUS | Status: AC
  Administered 2015-01-29 (×3): 10 meq via INTRAVENOUS
  Filled 2015-01-29 (×3): qty 50

## 2015-01-29 NOTE — Progress Notes (Signed)
Patient ID: Maria Bauer, female   DOB: 07-Mar-1946, 69 y.o.   MRN: [id number] EVENING ROUNDS NOTE :     [address]       [place] [address]             [phone]                 3 Days Post-Op Procedure(s) (LRB): PERICARDIAL WINDOW (N/A) MEDIASTINOTOMY CHAMBERLAIN MCNEIL (Left)  Total Length of Stay:  LOS: 4 days  BP 97/56 mmHg  Pulse 95  Temp(Src) 99.9 F (37.7 C) (Axillary)  Resp 26  Ht 5\' 4"  (1.626 m)  Wt 212 lb (96.163 kg)  BMI 36.37 kg/m2  SpO2 100%  .Intake/Output      11/07 0701 - 11/08 0700   I.V. (mL/kg) 553.1 (5.8)   NG/GT 270   IV Piggyback 150   Total Intake(mL/kg) 973.1 (10.1)   Urine (mL/kg/hr) 3450 (2.9)   Emesis/NG output    Stool 200 (0.2)   Chest Tube 80 (0.1)   Total Output 3730   Net -2756.9         . dexmedetomidine Stopped (01/28/15 1330)  . phenylephrine (NEO-SYNEPHRINE) Adult infusion 45 mcg/min (01/29/15 1900)  . propofol (DIPRIVAN) infusion 35 mcg/kg/min (01/29/15 1900)     Lab Results  Component Value Date   WBC 12.2* 01/29/2015   HGB 12.2 01/29/2015   HCT 36.0 01/29/2015   PLT 207 01/29/2015   GLUCOSE 134* 01/29/2015   TRIG 65 01/28/2015   ALT 32 01/28/2015   AST 34 01/28/2015   NA 144 01/29/2015   K 4.6 01/29/2015   CL 112* 01/29/2015   CREATININE 0.50 01/29/2015   BUN 15 01/29/2015   CO2 21* 01/29/2015   INR 1.24 02/11/2015   Sedated on vent, not able to wean yet CCM following   Grace Isaac MD  Beeper [phone] Office [phone] 01/29/2015 7:32 PM

## 2015-01-29 NOTE — Progress Notes (Signed)
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, non resectable- radiation oncology consult called No drainage from pericardial drain  Objective: Vital signs in last 24 hours: Temp:  [98.5 F (36.9 C)-100.9 F (38.3 C)] 100 F (37.8 C) (11/07 0700) Pulse Rate:  [70-119] 94 (11/07 0700) Cardiac Rhythm:  [-] Sinus tachycardia (11/07 0600) Resp:  [19-29] 24 (11/07 0700) BP: (81-122)/(43-89) 101/57 mmHg (11/07 0700) SpO2:  [96 %-100 %] 100 % (11/07 0700) Arterial Line BP: (71-176)/(47-104) 102/50 mmHg (11/07 0700) FiO2 (%):  [50 %] 50 % (11/07 0600) Weight:  [212 lb (96.163 kg)] 212 lb (96.163 kg) (11/07 0000)  Hemodynamic parameters for last 24 hours:   nsr Intake/Output from previous day: 11/06 0701 - 11/07 0700 In: 4087 [I.V.:3437; NG/GT:300; IV Piggyback:350] Out: 3500 [Urine:3060; Emesis/NG output:200; Chest Tube:240] Intake/Output this shift:    Sedated Distant breath sounds abd soft  Lab Results:  Recent Labs  01/28/15 0424 01/28/15 1627 01/29/15 0441  WBC 9.5  --  12.2*  HGB 11.5* 13.3 11.5*  HCT 36.3 39.0 36.4  PLT 196  --  207   BMET:  Recent Labs  01/28/15 0424 01/28/15 1627 01/29/15 0441  NA 142 142 141  K 3.7 4.2 3.5  CL 109 108 111  CO2 25  --  21*  GLUCOSE 128* 151* 124*  BUN 6 7 13   CREATININE 0.60 0.40* 0.48  CALCIUM 8.3*  --  8.2*    PT/INR:  Recent Labs  02/09/2015 1546  LABPROT 15.7*  INR 1.24   ABG    Component Value Date/Time   PHART 7.436 01/29/2015 0410   HCO3 22.5 01/29/2015 0410   TCO2 24 01/29/2015 0410   ACIDBASEDEF 1.0 01/29/2015 0410   O2SAT 96.0 01/29/2015 0410   CBG (last 3)   Recent Labs  01/28/15 1941 01/28/15 2353 01/29/15 0410  GLUCAP 103* 102* 104*    Assessment/Plan: S/P Procedure(s) (LRB): PERICARDIAL WINDOW  (N/A) MEDIASTINOTOMY CHAMBERLAIN MCNEIL (Left) Vent wean per CCM OOB when extubated Leave chest tubes   LOS: 4 days    Maria Bauer 01/29/2015

## 2015-01-29 NOTE — Progress Notes (Signed)
Name: Maria Bauer MRN: [id number] DOB: 1945-05-07    ADMISSION DATE:  02/17/2015  CONSULTATION DATE:  02/07/2015  REFERRING MD :  Dr. Harrington Challenger  CHIEF COMPLAINT:  Pericardial effusion  BRIEF PATIENT DESCRIPTION: 69 year old female with recent diagnosis of mediastinal mass, was seen by cardiothoracic surgery in the outpatient setting who referred her for an echocardiogram 11/3. Echocardiogram findings included large pericardial effusion. Due to persistent cough she is considered high risk for pericardiocentesis and requires admission for cough suppression prior to procedure. PCCM asked to see in consultation.  SIGNIFICANT EVENTS  10/27 evaluated in ED for cough. Prior CXR from outpatient noted with possible chest mass. 11/2 seen by Dr. Prescott Gum as outpatient regarding CT findings, appears to be non-resectable mass 11/3 presented for echo, large effusion found, admitted for drainage.  11/4 to OR for pericardial window and mediastinal mass biopsy. 11/6 Remains intubated due to hypoxemia.  STUDIES:  CT chest (10/28) > 12.0 x 8.8 cm abnormal soft tissue density seen in anterior mediastinum concerning for adenopathy related to lymphoma or other metastatic disease. Some compression of the left innominate vein is noted as a result. 5 mm nodule is noted posteriorly in the right lower lobe. This may represent metastatic focus given above finding. Alternatively, it may be unrelated. Continued CT follow-up is recommended to ensure stability. Moderate pericardial effusion is noted as well. 11/3 echo> LVEF 55-60%, pericardium with large effusion, pericardial mass? Tamponade noted  SUBJECTIVE:  Wakes up enough to be agitated but not following command.  VITAL SIGNS: Temp:  [98.5 F (36.9 C)-100.9 F (38.3 C)] 100.2 F (37.9 C) (11/07 0900) Pulse Rate:  [70-119] 95 (11/07 0807) Resp:  [19-32] 20 (11/07 0900) BP: (81-122)/(43-89) 104/55 mmHg (11/07 0900) SpO2:  [96 %-100 %] 100 % (11/07 0807) Arterial  Line BP: (71-176)/(47-104) 88/54 mmHg (11/07 0900) FiO2 (%):  [40 %-50 %] 40 % (11/07 0856) Weight:  [96.163 kg (212 lb)] 96.163 kg (212 lb) (11/07 0000)  PHYSICAL EXAMINATION: General:  Sedated on vent, not following command. HENT: ETT tube in place, [place]/AT, PERRL, EOM-I. PULM: Clear, diminished breath sounds. CV: RRR, distant, extremities well perfused GI: BS+, soft, nontender MSK: normal bulk, no bony deformities Neuro: sedated on vent  Recent Labs Lab 01/27/15 0400 01/28/15 0424 01/28/15 1627 01/29/15 0441  NA 135 142 142 141  K 3.4* 3.7 4.2 3.5  CL 101 109 108 111  CO2 25 25  --  21*  BUN 11 6 7 13   CREATININE 0.61 0.60 0.40* 0.48  GLUCOSE 171* 128* 151* 124*   Recent Labs Lab 01/27/15 0400 01/28/15 0424 01/28/15 1627 01/29/15 0441  HGB 11.3* 11.5* 13.3 11.5*  HCT 36.1 36.3 39.0 36.4  WBC 10.2 9.5  --  12.2*  PLT 225 196  --  207   Dg Chest Port 1 View  01/29/2015  CLINICAL DATA:  Shortness of breath, acute respiratory failure, intubated patient, history of mediastinal mass, pericardial effusion, and cardiac tamponade. EXAM: PORTABLE CHEST 1 VIEW COMPARISON:  Portable chest x-ray of January 28, 2015 FINDINGS: The right lung is well-expanded. There is a trace of pleural fluid at the right lung base. On the left the cardiac silhouette occupies much of the hemi thorax. A small amount of aerated lung in the apex and inferior laterally is still demonstrated. The endotracheal tube tip projects approximately 3.7 cm above the carina. The esophagogastric tube tip projects below the inferior margin of the image. A left-sided chest tube has its tip located over the  lateral aspect of the approximately 6 rib and is stable. A second tube versus the tip of the esophagogastric tube is noted medially at the left lung base. IMPRESSION: Persistent large mediastinal mass on the left. Slight interval worsening in volume loss on the left. The cardiac silhouette remains enlarged. No pneumothorax  nor large left pleural effusion is observed. The right lung is largely clear. Electronically Signed   By: David  Martinique M.D.   On: 01/29/2015 07:25   Dg Chest Port 1 View  01/28/2015  CLINICAL DATA:  ET tube placement EXAM: PORTABLE CHEST 1 VIEW COMPARISON:  01/27/15 FINDINGS: The ET tube tip is stable above the carina. There is a right IJ catheter with tip in the projection of the SVC. Left-sided chest tube is identified. No pneumothorax. Stable cardiac enlargement and stable large anterior mediastinal mass. No change in small bilateral pleural effusions and pulmonary edema consistent with CHF. IMPRESSION: 1. Left chest tube in place without visible pneumothorax. 2. Large anterior mediastinal mass 3. Bilateral pleural effusions and pulmonary edema. Electronically Signed   By: Kerby Moors M.D.   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  Plan: Adjust PEEP/FIO2 to maintain O2 saturation > 90%. Fentanyl for pain control. Reduce sedation as able, currently on propofol. Oxygenation is improving after lasix, PS trials as ordered. Chest tube management per thoracic surgery. More aggressive diureses today.  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  RENAL A: No acute issues P: Monitor BMET and UOP Replace electrolytes as needed Lasix 40 mg IV q8 x2 doses.  GI:  A: no acute issues P:  OG tube Pepcid for stress ulcer prophylaxis TF  ID:  A: No acute issues P: Monitor for fever  Neuro:  A: Post op pain P: Fentanyl  prn Propofol titrated to RASS -1 Still agitated. Will switch sedation to precedex.  No family at bedside.  Begin PS trials today, if agitation is under control and diureses well overnight then will consider extubation in AM.  The patient is critically ill with multiple organ systems failure and requires high complexity decision making for assessment and support, frequent evaluation and titration of therapies, application of advanced monitoring technologies and extensive interpretation of multiple databases.   Critical Care Time devoted to patient care services described in this note is  35  Minutes. This time reflects time of care of this signee Dr Jennet Maduro. This critical care time does not reflect procedure time, or teaching time or supervisory time of PA/NP/Med student/Med Resident etc but could involve care discussion time.  Rush Farmer, M.D. [facility]/Critical Care Medicine. Pager: [phone]. After hours pager: [phone].

## 2015-01-29 NOTE — Progress Notes (Signed)
PICC line attempted x 3 (C.Jaydence Vanyo RN x 2 and R. Newman x 1) was unsuccessful. GBR with each stick but unable to advance the guidewire beyond the axillary. Pt currently has a temporary central line in place. RN will be notified to contact the MD for update and possible alternative central line access. Catalina Pizza

## 2015-01-30 ENCOUNTER — Ambulatory Visit (HOSPITAL_COMMUNITY): Payer: Medicare (Managed Care)

## 2015-01-30 ENCOUNTER — Inpatient Hospital Stay (HOSPITAL_COMMUNITY): Payer: Medicare (Managed Care)

## 2015-01-30 LAB — POCT I-STAT 3, ART BLOOD GAS (G3+)
ACID-BASE EXCESS: 3 mmol/L — AB (ref 0.0–2.0)
Acid-Base Excess: 3 mmol/L — ABNORMAL HIGH (ref 0.0–2.0)
BICARBONATE: 27.4 meq/L — AB (ref 20.0–24.0)
Bicarbonate: 27.3 mEq/L — ABNORMAL HIGH (ref 20.0–24.0)
O2 SAT: 95 %
O2 Saturation: 97 %
PCO2 ART: 38.3 mmHg (ref 35.0–45.0)
PH ART: 7.462 — AB (ref 7.350–7.450)
Patient temperature: 98.6
TCO2: 29 mmol/L (ref 0–100)
TCO2: 29 mmol/L (ref 0–100)
pCO2 arterial: 39.8 mmHg (ref 35.0–45.0)
pH, Arterial: 7.443 (ref 7.350–7.450)
pO2, Arterial: 73 mmHg — ABNORMAL LOW (ref 80.0–100.0)
pO2, Arterial: 85 mmHg (ref 80.0–100.0)

## 2015-01-30 LAB — COMPREHENSIVE METABOLIC PANEL
ALT: 24 U/L (ref 14–54)
AST: 36 U/L (ref 15–41)
Albumin: 2.4 g/dL — ABNORMAL LOW (ref 3.5–5.0)
Alkaline Phosphatase: 60 U/L (ref 38–126)
Anion gap: 9 (ref 5–15)
BUN: 21 mg/dL — ABNORMAL HIGH (ref 6–20)
CO2: 24 mmol/L (ref 22–32)
Calcium: 9.1 mg/dL (ref 8.9–10.3)
Chloride: 112 mmol/L — ABNORMAL HIGH (ref 101–111)
Creatinine, Ser: 0.55 mg/dL (ref 0.44–1.00)
GFR calc Af Amer: >60 mL/min (ref >60)
GFR calc non Af Amer: >60 mL/min (ref >60)
Glucose, Bld: 127 mg/dL — ABNORMAL HIGH (ref 65–99)
Potassium: 4.3 mmol/L (ref 3.5–5.1)
Sodium: 145 mmol/L (ref 135–145)
Total Bilirubin: 1.1 mg/dL (ref 0.3–1.2)
Total Protein: 5.4 g/dL — ABNORMAL LOW (ref 6.5–8.1)

## 2015-01-30 LAB — GLUCOSE, CAPILLARY
GLUCOSE-CAPILLARY: 156 mg/dL — AB (ref 65–99)
GLUCOSE-CAPILLARY: 185 mg/dL — AB (ref 65–99)
GLUCOSE-CAPILLARY: 208 mg/dL — AB (ref 65–99)
Glucose-Capillary: 107 mg/dL — ABNORMAL HIGH (ref 65–99)
Glucose-Capillary: 112 mg/dL — ABNORMAL HIGH (ref 65–99)
Glucose-Capillary: 118 mg/dL — ABNORMAL HIGH (ref 65–99)
Glucose-Capillary: 137 mg/dL — ABNORMAL HIGH (ref 65–99)

## 2015-01-30 LAB — POCT I-STAT, CHEM 8
BUN: 34 mg/dL — ABNORMAL HIGH (ref 6–20)
CREATININE: 0.5 mg/dL (ref 0.44–1.00)
Calcium, Ion: 1.25 mmol/L (ref 1.13–1.30)
Chloride: 107 mmol/L (ref 101–111)
GLUCOSE: 181 mg/dL — AB (ref 65–99)
HEMATOCRIT: 40 % (ref 36.0–46.0)
HEMOGLOBIN: 13.6 g/dL (ref 12.0–15.0)
POTASSIUM: 4.6 mmol/L (ref 3.5–5.1)
SODIUM: 144 mmol/L (ref 135–145)
TCO2: 26 mmol/L (ref 0–100)

## 2015-01-30 LAB — CBC
HCT: 38.6 % (ref 36.0–46.0)
Hemoglobin: 12 g/dL (ref 12.0–15.0)
MCH: 30.2 pg (ref 26.0–34.0)
MCHC: 31.1 g/dL (ref 30.0–36.0)
MCV: 97.2 fL (ref 78.0–100.0)
Platelets: 278 10*3/uL (ref 150–400)
RBC: 3.97 MIL/uL (ref 3.87–5.11)
RDW: 15.2 % (ref 11.5–15.5)
WBC: 12.5 10*3/uL — ABNORMAL HIGH (ref 4.0–10.5)

## 2015-01-30 LAB — MAGNESIUM: Magnesium: 2.1 mg/dL (ref 1.7–2.4)

## 2015-01-30 LAB — URINE MICROSCOPIC-ADD ON

## 2015-01-30 LAB — URINALYSIS, ROUTINE W REFLEX MICROSCOPIC
BILIRUBIN URINE: NEGATIVE
Glucose, UA: 500 mg/dL — AB
HGB URINE DIPSTICK: NEGATIVE
Ketones, ur: 15 mg/dL — AB
Leukocytes, UA: NEGATIVE
Nitrite: NEGATIVE
PH: 6.5 (ref 5.0–8.0)
Protein, ur: 30 mg/dL — AB
SPECIFIC GRAVITY, URINE: 1.037 — AB (ref 1.005–1.030)
UROBILINOGEN UA: 1 mg/dL (ref 0.0–1.0)

## 2015-01-30 LAB — OCCULT BLOOD X 1 CARD TO LAB, STOOL: Fecal Occult Bld: NEGATIVE

## 2015-01-30 LAB — PHOSPHORUS: Phosphorus: 3.3 mg/dL (ref 2.5–4.6)

## 2015-01-30 MED ORDER — PRO-STAT SUGAR FREE PO LIQD
60.0000 mL | Freq: Three times a day (TID) | ORAL | Status: DC
  Administered 2015-01-30 – 2015-01-31 (×5): 60 mL
  Administered 2015-02-01: 30 mL
  Administered 2015-02-01 – 2015-02-08 (×21): 60 mL
  Filled 2015-01-30 (×32): qty 60

## 2015-01-30 MED ORDER — VITAL HIGH PROTEIN PO LIQD
1000.0000 mL | ORAL | Status: DC
  Administered 2015-01-30 (×6)
  Administered 2015-01-30: 1000 mL
  Administered 2015-01-30 (×2)
  Filled 2015-01-30 (×3): qty 1000

## 2015-01-30 MED ORDER — FUROSEMIDE 10 MG/ML IJ SOLN
20.0000 mg | Freq: Three times a day (TID) | INTRAMUSCULAR | Status: AC
  Administered 2015-01-30 (×2): 20 mg via INTRAVENOUS
  Filled 2015-01-30 (×2): qty 2

## 2015-01-30 MED ORDER — DEXMEDETOMIDINE HCL IN NACL 400 MCG/100ML IV SOLN
0.0000 ug/kg/h | INTRAVENOUS | Status: AC
  Administered 2015-01-30 (×5): 1.2 ug/kg/h via INTRAVENOUS
  Administered 2015-01-30: 0.4 ug/kg/h via INTRAVENOUS
  Administered 2015-01-31 – 2015-02-01 (×10): 1.2 ug/kg/h via INTRAVENOUS
  Administered 2015-02-01: 0.7 ug/kg/h via INTRAVENOUS
  Administered 2015-02-01 – 2015-02-02 (×7): 1.2 ug/kg/h via INTRAVENOUS
  Filled 2015-01-30: qty 100
  Filled 2015-01-30: qty 50
  Filled 2015-01-30 (×4): qty 100
  Filled 2015-01-30: qty 50
  Filled 2015-01-30 (×6): qty 100
  Filled 2015-01-30: qty 50
  Filled 2015-01-30 (×4): qty 100
  Filled 2015-01-30 (×2): qty 50
  Filled 2015-01-30 (×4): qty 100

## 2015-01-30 NOTE — Progress Notes (Signed)
[facility] Progress Note Patient Name: Maria Bauer DOB: 1946-02-09 MRN: [id number]   Date of Service  01/30/2015  HPI/Events of Note  Fever to 102  eICU Interventions  Blood culture Urinalysis Chest x-ray     Intervention Category Major Interventions: Infection - evaluation and management  Simonne Maffucci 01/30/2015, 9:37 PM

## 2015-01-30 NOTE — Progress Notes (Addendum)
[facility] DAILY ICU PROGRESS NOTE                   [address]            [place] [address]          [phone]   4 Days Post-Op Procedure(s) (LRB): PERICARDIAL WINDOW (N/A) MEDIASTINOTOMY CHAMBERLAIN MCNEIL (Left)  Total Length of Stay:  LOS: 5 days   Subjective:  Remains sedated on vent  Objective: Vital signs in last 24 hours: Temp:  [98.6 F (37 C)-101.1 F (38.4 C)] 98.6 F (37 C) (11/08 0348) Pulse Rate:  [90-124] 99 (11/08 0600) Cardiac Rhythm:  [-] Normal sinus rhythm (11/08 0537) Resp:  [16-36] 20 (11/08 0600) BP: (82-124)/(47-111) 103/63 mmHg (11/08 0600) SpO2:  [96 %-100 %] 99 % (11/08 0600) Arterial Line BP: (64-141)/(49-87) 94/82 mmHg (11/08 0600) FiO2 (%):  [40 %-50 %] 40 % (11/08 0407) Weight:  [211 lb 6.7 oz (95.9 kg)] 211 lb 6.7 oz (95.9 kg) (11/08 0300)  Filed Weights   01/28/15 0400 01/29/15 0000 01/30/15 0300  Weight: 207 lb 10.8 oz (94.2 kg) 212 lb (96.163 kg) 211 lb 6.7 oz (95.9 kg)    Weight change: -9.3 oz (-0.263 kg)   Intake/Output from previous day: 11/07 0701 - 11/08 0700 In: 1542.3 [I.V.:962.3; NG/GT:380; IV Piggyback:200] Out: 5305 [Urine:4785; Stool:300; Chest Tube:220]  Current Meds: Scheduled Meds: . acetaminophen  1,000 mg Oral 4 times per day   Or  . acetaminophen (TYLENOL) oral liquid 160 mg/5 mL  1,000 mg Oral 4 times per day  . antiseptic oral rinse  7 mL Mouth Rinse QID  . bisacodyl  10 mg Oral Daily  . cefUROXime (ZINACEF)  IV  1.5 g Intravenous Q12H  . chlorhexidine gluconate  15 mL Mouth Rinse BID  . famotidine (PEPCID) IV  20 mg Intravenous Q12H  . feeding supplement (PRO-STAT SUGAR FREE 64)  60 mL Oral TID  . feeding supplement (VITAL HIGH PROTEIN)  1,000 mL Per Tube Q24H  . insulin aspart  0-20 Units Subcutaneous 6 times per day  . metoCLOPramide (REGLAN) injection  10 mg Intravenous 4 times per day  . senna-docusate  1 tablet Oral QHS   Continuous Infusions: . dexmedetomidine Stopped (01/28/15  1330)  . phenylephrine (NEO-SYNEPHRINE) Adult infusion 45 mcg/min (01/30/15 0500)  . propofol (DIPRIVAN) infusion 35 mcg/kg/min (01/30/15 0427)   PRN Meds:.fentaNYL (SUBLIMAZE) injection, ondansetron (ZOFRAN) IV, potassium chloride  General appearance: alert, cooperative and intubated, sedated Heart: regular rate and rhythm Lungs: diminished breath sounds bilaterally Abdomen: soft, non-tender; bowel sounds normal; no masses,  no organomegaly Wound: clean and dry  Lab Results: CBC: Recent Labs  01/29/15 0441 01/29/15 1532 01/30/15 0415  WBC 12.2*  --  12.5*  HGB 11.5* 12.2 12.0  HCT 36.4 36.0 38.6  PLT 207  --  278   BMET:  Recent Labs  01/29/15 0441 01/29/15 1532 01/30/15 0415  NA 141 144 145  K 3.5 4.6 4.3  CL 111 112* 112*  CO2 21*  --  24  GLUCOSE 124* 134* 127*  BUN 13 15 21*  CREATININE 0.48 0.50 0.55  CALCIUM 8.2*  --  9.1    PT/INR: No results for input(s): LABPROT, INR in the last 72 hours. Radiology: Dg Chest Port 1 View  01/30/2015  CLINICAL DATA:  Intubated patient, acute respiratory failure and hypoxia, history of mediastinal mass, pericardial effusion and cardiac tamponade EXAM: PORTABLE CHEST 1 VIEW COMPARISON:  Portable chest  x-ray of January 29, 2015. FINDINGS: Stable volume loss on the left with large mediastinal mass. The left-sided chest tubes are unchanged in position. There is no pneumothorax nor large pleural effusion. The right lung is well-expanded. Mild interstitial prominence throughout the right lung is stable. The pulmonary vascularity is not engorged. The endotracheal tube tip lies 3.8 cm above the carina. The right internal jugular venous catheter tip projects over the midportion of the SVC. The esophagogastric tube tip projects below the inferior margin of the image. IMPRESSION: There has been no significant change in the appearance of the chest since yesterday's study. The support tubes are in reasonable position. Electronically Signed   By:  David  Martinique M.D.   On: 01/30/2015 07:21     Assessment/Plan: S/P Procedure(s) (LRB): PERICARDIAL WINDOW (N/A) MEDIASTINOTOMY CHAMBERLAIN MCNEIL (Left)  1. Remains sedated on vent- CCM assisting in wean 2. CV- remains on Neo 3. Chest tube- 220 cc output yesterday, leave tubes in place until extubated 4. Dispo- vent management per CCM, wean Neo as tolerated, chest tubes in place until extubated, continue current care     Ellwood Handler 01/30/2015 7:52 AM  Path still pending Leave chest tubes Advance  tube feeds while intubated  patient examined and medical record reviewed,agree with above note. Tharon Aquas Trigt III 01/30/2015

## 2015-01-30 NOTE — Progress Notes (Signed)
Nutrition Follow Up  DOCUMENTATION CODES:   Obesity unspecified  INTERVENTION:    Recommend Vital High Protein formula at goal rate of 55 ml/hr  TF regimen to provide 1320 kcals, 115 gm protein, 1104 ml of free water  NUTRITION DIAGNOSIS:   Inadequate oral intake related to inability to eat as evidenced by NPO status, ongoing   GOAL:   Provide needs based on ASPEN/SCCM guidelines, unmet  MONITOR:   TF tolerance, Vent status, Labs, Weight trends, I & O's  ASSESSMENT:   69 yo Female with PMH of scoliosis, HTN, anxiety; admitted with pericardial effusion, mediastinal mass.   Patient s/p procedure 11/4: SUBXIPHOID PERICARDIAL WINDOW MEDIASTINOTOMY   Patient is currently intubated on ventilator support MV: 12.1 L//min Temp (24hrs), Avg:99.3 F (37.4 C), Min:98.2 F (36.8 C), Max:100.5 F (38.1 C)   Propofol discontinued.  Vital High Protein formula initiated today; currently infusing at 20 ml/hr via OGT.  Also, receiving Prostat liquid protein 60 ml TID.  Total TF regimen providing 1080 kcals, 132 gm protein, 401 ml of free water.  IV Reglan started 11/6.  Diet Order:  Diet NPO time specified  Skin:  Reviewed, no issues  Last BM:  11/6  Height:   Ht Readings from Last 1 Encounters:  02/07/2015 5\' 4"  (1.626 m)    Weight:   Wt Readings from Last 1 Encounters:  01/30/15 211 lb 6.7 oz (95.9 kg)    Ideal Body Weight:  54.5 kg  BMI:  Body mass index is 36.27 kg/(m^2).  Estimated Nutritional Needs:   Kcal:  5465-0354  Protein:  110-120 gm  Fluid:  per MD  EDUCATION NEEDS:   No education needs identified at this time  Arthur Holms, RD, LDN Pager #: [phone] After-Hours Pager #: [phone]

## 2015-01-30 NOTE — Progress Notes (Signed)
Pt with continued fever 102.7 after scheduled tylenol and ice packs applied.  Dr. Lake Bells notified, orders received.  Will continue to monitor.  Vista Lawman, RN

## 2015-01-30 NOTE — Progress Notes (Signed)
   01/30/15 1000  Clinical Encounter Type  Visited With Patient  Visit Type Spiritual support  Referral From Nurse  Spiritual Encounters  Spiritual Needs Prayer  Stress Factors  Patient Stress Factors Health changes  According to Director and nurse, patient not weaning well off vent. Patient very drowsy and sedated. Said prayer with her.

## 2015-01-30 NOTE — Progress Notes (Signed)
Name: Maria Bauer MRN: [id number] DOB: July 14, 1945    ADMISSION DATE:  02/15/2015  CONSULTATION DATE:  02/09/2015  REFERRING MD :  Dr. Harrington Challenger  CHIEF COMPLAINT:  Pericardial effusion  BRIEF PATIENT DESCRIPTION: 70 year old female with recent diagnosis of mediastinal mass, was seen by cardiothoracic surgery in the outpatient setting who referred her for an echocardiogram 11/3. Echocardiogram findings included large pericardial effusion. Due to persistent cough she is considered high risk for pericardiocentesis and requires admission for cough suppression prior to procedure. PCCM asked to see in consultation.  SIGNIFICANT EVENTS  10/27 evaluated in ED for cough. Prior CXR from outpatient noted with possible chest mass. 11/2 seen by Dr. Prescott Gum as outpatient regarding CT findings, appears to be non-resectable mass 11/3 presented for echo, large effusion found, admitted for drainage.  11/4 to OR for pericardial window and mediastinal mass biopsy. 11/6 Remains intubated due to hypoxemia. 11/7 Remains intubated, very agitated with WUA, unable to wean. Still on low dose Neo  STUDIES:  CT chest (10/28) > 12.0 x 8.8 cm abnormal soft tissue density seen in anterior mediastinum concerning for adenopathy related to lymphoma or other metastatic disease. Some compression of the left innominate vein is noted as a result. 5 mm nodule is noted posteriorly in the right lower lobe. This may represent metastatic focus given above finding. Alternatively, it may be unrelated. Continued CT follow-up is recommended to ensure stability. Moderate pericardial effusion is noted as well. 11/3 echo> LVEF 55-60%, pericardium with large effusion, pericardial mass? Tamponade noted  SUBJECTIVE:  Was very agitated on WUA this AM. Tachycardic and tachypneic to 40s with minimal reduction in propofol dosing. SBT not attempted for these reasons.   VITAL SIGNS: Temp:  [98.2 F (36.8 C)-101.1 F (38.4 C)] 100.5 F (38.1 C)  (11/08 1100) Pulse Rate:  [87-124] 87 (11/08 0745) Resp:  [16-36] 25 (11/08 0745) BP: (82-124)/(47-111) 103/63 mmHg (11/08 0600) SpO2:  [96 %-100 %] 100 % (11/08 0745) Arterial Line BP: (64-116)/(49-82) 94/82 mmHg (11/08 0600) FiO2 (%):  [40 %] 40 % (11/08 0745) Weight:  [95.9 kg (211 lb 6.7 oz)] 95.9 kg (211 lb 6.7 oz) (11/08 0300)  PHYSICAL EXAMINATION: General:  Sedated on vent, not following command. HENT: ETT tube in place, [place]/AT, PERRL PULM: Clear, diminished breath sounds. CV: RRR, distant, extremities well perfused GI: obese, BS+, soft, non-distended MSK: normal bulk, no bony deformities Neuro: sedated on vent  Recent Labs Lab 01/28/15 0424  01/29/15 0441 01/29/15 1532 01/30/15 0415  NA 142  < > 141 144 145  K 3.7  < > 3.5 4.6 4.3  CL 109  < > 111 112* 112*  CO2 25  --  21*  --  24  BUN 6  < > 13 15 21*  CREATININE 0.60  < > 0.48 0.50 0.55  GLUCOSE 128*  < > 124* 134* 127*  < > = values in this interval not displayed. Recent Labs Lab 01/28/15 0424  01/29/15 0441 01/29/15 1532 01/30/15 0415  HGB 11.5*  < > 11.5* 12.2 12.0  HCT 36.3  < > 36.4 36.0 38.6  WBC 9.5  --  12.2*  --  12.5*  PLT 196  --  207  --  278  < > = values in this interval not displayed. Dg Chest Port 1 View  01/30/2015  CLINICAL DATA:  Intubated patient, acute respiratory failure and hypoxia, history of mediastinal mass, pericardial effusion and cardiac tamponade EXAM: PORTABLE CHEST 1 VIEW COMPARISON:  Portable chest  x-ray of January 29, 2015. FINDINGS: Stable volume loss on the left with large mediastinal mass. The left-sided chest tubes are unchanged in position. There is no pneumothorax nor large pleural effusion. The right lung is well-expanded. Mild interstitial prominence throughout the right lung is stable. The pulmonary vascularity is not engorged. The endotracheal tube tip lies 3.8 cm above the carina. The right internal jugular venous catheter tip projects over the midportion of the SVC.  The esophagogastric tube tip projects below the inferior margin of the image. IMPRESSION: There has been no significant change in the appearance of the chest since yesterday's study. The support tubes are in reasonable position. Electronically Signed   By: David  Martinique M.D.   On: 01/30/2015 07:21   Dg Chest Port 1 View  01/29/2015  CLINICAL DATA:  Shortness of breath, acute respiratory failure, intubated patient, history of mediastinal mass, pericardial effusion, and cardiac tamponade. EXAM: PORTABLE CHEST 1 VIEW COMPARISON:  Portable chest x-ray of January 28, 2015 FINDINGS: The right lung is well-expanded. There is a trace of pleural fluid at the right lung base. On the left the cardiac silhouette occupies much of the hemi thorax. A small amount of aerated lung in the apex and inferior laterally is still demonstrated. The endotracheal tube tip projects approximately 3.7 cm above the carina. The esophagogastric tube tip projects below the inferior margin of the image. A left-sided chest tube has its tip located over the lateral aspect of the approximately 6 rib and is stable. A second tube versus the tip of the esophagogastric tube is noted medially at the left lung base. IMPRESSION: Persistent large mediastinal mass on the left. Slight interval worsening in volume loss on the left. The cardiac silhouette remains enlarged. No pneumothorax nor large left pleural effusion is observed. The right lung is largely clear. Electronically Signed   By: David  Martinique M.D.   On: 01/29/2015 07:25     ASSESSMENT / PLAN:  PULMONARY A: Large pericardial effusion with mediastinal mass, now s/p pericardial window and mediastinal mass biopsy Cough Dyspnea pre op Large mediastinal mass - concern for lymphoma Pulmonary nodule - 84mm RLL Remains intubated post operatively.  Plan: Adjust PEEP/FIO2 to maintain O2 saturation > 90%. Reduce sedation as able, changing to precedex PS trials Chest tube management per  thoracic surgery.  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 Plan: Follow up path Hold anti-HTN. Hold sedation Changing sedation  RENAL A: No acute issues P: Monitor BMET and UOP Replace electrolytes as needed Lasix 20 mg IV q8 x2 doses.  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  Patient remains sedated but will stop propofol today.  Aim is to extubate patient today.  Will continue diureses but lower dose.  PS trials today, if does well then will consider extubation.  Lungs clear on exam diminished on the left.  Hope is to extubate today and await pathology.  The patient is critically ill with multiple  organ systems failure and requires high complexity decision making for assessment and support, frequent evaluation and titration of therapies, application of advanced monitoring technologies and extensive interpretation of multiple databases.   Critical Care Time devoted to patient care services described in this note is  35  Minutes. This time reflects time of care of this signee Dr Jennet Maduro. This critical care time does not reflect procedure time, or teaching time or supervisory time of PA/NP/Med student/Med Resident etc but could involve care discussion time.  Rush Farmer, M.D. [facility] Pulmonary/Critical Care Medicine. Pager: [phone]. After hours pager: [phone].  01/30/2015 12:01 PM

## 2015-01-30 NOTE — Progress Notes (Signed)
      [address]       [place], [address]             [phone]      PM rounds  intubated  BP 155/80 mmHg  Pulse 89  Temp(Src) 98.5 F (36.9 C) (Axillary)  Resp 28  Ht 5\' 4"  (1.626 m)  Wt 211 lb 6.7 oz (95.9 kg)  BMI 36.27 kg/m2  SpO2 100%   Intake/Output Summary (Last 24 hours) at 01/30/15 1739 Last data filed at 01/30/15 1700  Gross per 24 hour  Intake 1252.08 ml  Output   3380 ml  Net -2127.92 ml    Unable to wean from vent today  Continue current care  Giorgi Debruin C. Roxan Hockey, MD [facility] [phone]

## 2015-01-31 ENCOUNTER — Inpatient Hospital Stay (HOSPITAL_COMMUNITY): Payer: Medicare (Managed Care)

## 2015-01-31 ENCOUNTER — Encounter: Payer: No Typology Code available for payment source | Admitting: Cardiothoracic Surgery

## 2015-01-31 ENCOUNTER — Telehealth: Payer: Self-pay | Admitting: *Deleted

## 2015-01-31 LAB — COMPREHENSIVE METABOLIC PANEL
ALT: 19 U/L (ref 14–54)
AST: 26 U/L (ref 15–41)
Albumin: 2 g/dL — ABNORMAL LOW (ref 3.5–5.0)
Alkaline Phosphatase: 75 U/L (ref 38–126)
Anion gap: 9 (ref 5–15)
BUN: 30 mg/dL — ABNORMAL HIGH (ref 6–20)
CO2: 25 mmol/L (ref 22–32)
Calcium: 9.1 mg/dL (ref 8.9–10.3)
Chloride: 111 mmol/L (ref 101–111)
Creatinine, Ser: 0.52 mg/dL (ref 0.44–1.00)
GFR calc Af Amer: >60 mL/min (ref >60)
GFR calc non Af Amer: >60 mL/min (ref >60)
Glucose, Bld: 198 mg/dL — ABNORMAL HIGH (ref 65–99)
Potassium: 3.6 mmol/L (ref 3.5–5.1)
Sodium: 145 mmol/L (ref 135–145)
Total Bilirubin: 1.2 mg/dL (ref 0.3–1.2)
Total Protein: 5.3 g/dL — ABNORMAL LOW (ref 6.5–8.1)

## 2015-01-31 LAB — CBC
HCT: 36.6 % (ref 36.0–46.0)
Hemoglobin: 11.5 g/dL — ABNORMAL LOW (ref 12.0–15.0)
MCH: 30.5 pg (ref 26.0–34.0)
MCHC: 31.4 g/dL (ref 30.0–36.0)
MCV: 97.1 fL (ref 78.0–100.0)
Platelets: 251 10*3/uL (ref 150–400)
RBC: 3.77 MIL/uL — ABNORMAL LOW (ref 3.87–5.11)
RDW: 15 % (ref 11.5–15.5)
WBC: 6.9 10*3/uL (ref 4.0–10.5)

## 2015-01-31 LAB — MAGNESIUM: MAGNESIUM: 2.2 mg/dL (ref 1.7–2.4)

## 2015-01-31 LAB — GLUCOSE, CAPILLARY
GLUCOSE-CAPILLARY: 169 mg/dL — AB (ref 65–99)
GLUCOSE-CAPILLARY: 199 mg/dL — AB (ref 65–99)
Glucose-Capillary: 160 mg/dL — ABNORMAL HIGH (ref 65–99)
Glucose-Capillary: 180 mg/dL — ABNORMAL HIGH (ref 65–99)
Glucose-Capillary: 182 mg/dL — ABNORMAL HIGH (ref 65–99)
Glucose-Capillary: 194 mg/dL — ABNORMAL HIGH (ref 65–99)

## 2015-01-31 LAB — PROCALCITONIN: PROCALCITONIN: 3.2 ng/mL

## 2015-01-31 LAB — POCT I-STAT 3, ART BLOOD GAS (G3+)
Acid-Base Excess: 3 mmol/L — ABNORMAL HIGH (ref 0.0–2.0)
Bicarbonate: 25.9 mEq/L — ABNORMAL HIGH (ref 20.0–24.0)
O2 SAT: 90 %
PCO2 ART: 34 mmHg — AB (ref 35.0–45.0)
PH ART: 7.493 — AB (ref 7.350–7.450)
Patient temperature: 100.3
TCO2: 27 mmol/L (ref 0–100)
pO2, Arterial: 57 mmHg — ABNORMAL LOW (ref 80.0–100.0)

## 2015-01-31 LAB — PHOSPHORUS: PHOSPHORUS: 2.5 mg/dL (ref 2.5–4.6)

## 2015-01-31 MED ORDER — VITAL HIGH PROTEIN PO LIQD
1000.0000 mL | ORAL | Status: DC
  Administered 2015-01-31 (×3)
  Administered 2015-01-31 (×2): 1000 mL
  Administered 2015-01-31 – 2015-02-01 (×6)
  Administered 2015-02-01: 1000 mL
  Administered 2015-02-02
  Administered 2015-02-03 – 2015-02-04 (×2): 1000 mL
  Administered 2015-02-04 – 2015-02-05 (×11)
  Administered 2015-02-05 – 2015-02-08 (×5): 1000 mL
  Filled 2015-01-31 (×10): qty 1000

## 2015-01-31 MED ORDER — DEXTROSE 5 % IV SOLN
1.0000 g | Freq: Three times a day (TID) | INTRAVENOUS | Status: DC
  Administered 2015-01-31 – 2015-02-03 (×10): 1 g via INTRAVENOUS
  Filled 2015-01-31 (×13): qty 1

## 2015-01-31 MED ORDER — ACETAMINOPHEN 160 MG/5ML PO SOLN
650.0000 mg | Freq: Once | ORAL | Status: AC
  Administered 2015-01-31: 650 mg
  Filled 2015-01-31: qty 20.3

## 2015-01-31 MED ORDER — POTASSIUM CHLORIDE 10 MEQ/50ML IV SOLN
10.0000 meq | INTRAVENOUS | Status: AC
  Administered 2015-01-31 (×3): 10 meq via INTRAVENOUS
  Filled 2015-01-31 (×2): qty 50

## 2015-01-31 MED ORDER — FUROSEMIDE 10 MG/ML IJ SOLN
40.0000 mg | Freq: Three times a day (TID) | INTRAMUSCULAR | Status: AC
  Administered 2015-01-31: 40 mg via INTRAVENOUS
  Filled 2015-01-31: qty 4

## 2015-01-31 MED ORDER — FUROSEMIDE 10 MG/ML IJ SOLN: 20.0000 mg | Freq: Three times a day (TID) | INTRAMUSCULAR | Status: DC

## 2015-01-31 NOTE — Progress Notes (Signed)
CT surgery p.m. 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

## 2015-01-31 NOTE — Progress Notes (Signed)
Name: Maria Bauer MRN: [id number] DOB: 1945-08-19    ADMISSION DATE:  02/05/2015  CONSULTATION DATE:  02/17/2015  REFERRING MD :  Dr. Harrington Challenger  CHIEF COMPLAINT:  Pericardial effusion  BRIEF PATIENT DESCRIPTION: 69 year old female with recent diagnosis of mediastinal mass, was seen by cardiothoracic surgery in the outpatient setting who referred her for an echocardiogram 11/3. Echocardiogram findings included large pericardial effusion. Due to persistent cough she is considered high risk for pericardiocentesis and requires admission for cough suppression prior to procedure. PCCM asked to see in consultation.  SIGNIFICANT EVENTS  10/27 evaluated in ED for cough. Prior CXR from outpatient noted with possible chest mass. 11/2 seen by Dr. Prescott Gum as outpatient regarding CT findings, appears to be non-resectable mass 11/3 presented for echo, large effusion found, admitted for drainage.  11/4 to OR for pericardial window and mediastinal mass biopsy. 11/6 Remains intubated due to hypoxemia. 11/7 Remains intubated, very agitated with WUA, unable to wean. Still on low dose Neo 11/8 febrile overnight, cultures sent. Thick ETT secretions. Antibiotics started by TCTS.  STUDIES:  CT chest (10/28) > 12.0 x 8.8 cm abnormal soft tissue density seen in anterior mediastinum concerning for adenopathy related to lymphoma or other metastatic disease. Some compression of the left innominate vein is noted as a result. 5 mm nodule is noted posteriorly in the right lower lobe. This may represent metastatic focus given above finding. Alternatively, it may be unrelated. Continued CT follow-up is recommended to ensure stability. Moderate pericardial effusion is noted as well. 11/3 echo> LVEF 55-60%, pericardium with large effusion, pericardial mass? Tamponade noted  SUBJECTIVE:  Unable to wean from sedation this AM, very agitated and diaphoretic on WUA. Currently attempting to wean vent PS10/5 with precedex on. Fevers  overnight. Thick secretions. Cultures sent.   VITAL SIGNS: Temp:  [98.5 F (36.9 C)-102.9 F (39.4 C)] 100 F (37.8 C) (11/09 0745) Pulse Rate:  [28-150] 74 (11/09 0900) Resp:  [16-34] 18 (11/09 0900) BP: (81-156)/(48-81) 117/64 mmHg (11/09 0900) SpO2:  [83 %-100 %] 99 % (11/09 0900) Arterial Line BP: (64-155)/(44-88) 92/63 mmHg (11/09 0900) FiO2 (%):  [40 %] 40 % (11/09 0815) Weight:  [93 kg (205 lb 0.4 oz)] 93 kg (205 lb 0.4 oz) (11/09 0400)  PHYSICAL EXAMINATION:  General:  Sedated on vent, not following command. HENT: ETT tube in place, [place]/AT, PERRL PULM: Clear R, diminished/near absent on L CV: RRR, distant, extremities well perfused GI: obese, BS+, soft, non-distended MSK: normal bulk, no bony deformities Neuro: sedated on vent  Recent Labs Lab 01/29/15 0441  01/30/15 0415 01/30/15 1702 01/31/15 0413  NA 141  < > 145 144 145  K 3.5  < > 4.3 4.6 3.6  CL 111  < > 112* 107 111  CO2 21*  --  24  --  25  BUN 13  < > 21* 34* 30*  CREATININE 0.48  < > 0.55 0.50 0.52  GLUCOSE 124*  < > 127* 181* 198*  < > = values in this interval not displayed. Recent Labs Lab 01/29/15 0441  01/30/15 0415 01/30/15 1702 01/31/15 0413  HGB 11.5*  < > 12.0 13.6 11.5*  HCT 36.4  < > 38.6 40.0 36.6  WBC 12.2*  --  12.5*  --  6.9  PLT 207  --  278  --  251  < > = values in this interval not displayed. Dg Chest Port 1 View  01/31/2015  CLINICAL DATA:  Status post median sternotomy and pericardial  window creation for pleural effusion, acute respiratory failure, cardiac tamponade, mediastinal mass. EXAM: PORTABLE CHEST 1 VIEW COMPARISON:  Portable chest x-ray of January 27, 2015 FINDINGS: The trachea has apparently been extubated. There is linear radiodensity that projects over the lower neck which could be a portion of the endotracheal tube but if so it is positioned quite high. The right lung remains well-expanded and clear. On the left only a small amount of aerated lung persists in the  apex and laterally. The left chest tube is stable. The large mediastinal mass is unchanged. There is no pneumothorax nor definite pleural effusion. The esophagogastric tube tip projects below the inferior margin of the image. The right internal jugular venous catheter tip projects over the midportion of the SVC. IMPRESSION: Stable appearance of the chest following extubation of the trachea. Persistent large mediastinal mass. There is no pneumothorax. Electronically Signed   By: David  Martinique M.D.   On: 01/31/2015 07:33   Dg Chest Port 1 View  01/30/2015  CLINICAL DATA:  Persistent fever. Status post pericardial window and mediastinal biopsy. EXAM: PORTABLE CHEST 1 VIEW COMPARISON:  Multiple chest x-rays and chest CT from 01/19/2015 FINDINGS: The need support apparatus is stable. The endotracheal tube is 4.3 cm above the carina. The NG tube is stable. The left-sided chest tube is stable. Persistent large mediastinal mass occupying good portion of the left hemi thorax. Persistent vascular congestion and bibasilar atelectasis. IMPRESSION: Stable support apparatus. Stable left-sided chest tube.  No definite pneumothorax. Stable large mass occupying a good portion of the left hemi thorax. Electronically Signed   By: Marijo Sanes M.D.   On: 01/30/2015 23:30   Dg Chest Port 1 View  01/30/2015  CLINICAL DATA:  Intubated patient, acute respiratory failure and hypoxia, history of mediastinal mass, pericardial effusion and cardiac tamponade EXAM: PORTABLE CHEST 1 VIEW COMPARISON:  Portable chest x-ray of January 29, 2015. FINDINGS: Stable volume loss on the left with large mediastinal mass. The left-sided chest tubes are unchanged in position. There is no pneumothorax nor large pleural effusion. The right lung is well-expanded. Mild interstitial prominence throughout the right lung is stable. The pulmonary vascularity is not engorged. The endotracheal tube tip lies 3.8 cm above the carina. The right internal jugular  venous catheter tip projects over the midportion of the SVC. The esophagogastric tube tip projects below the inferior margin of the image. IMPRESSION: There has been no significant change in the appearance of the chest since yesterday's study. The support tubes are in reasonable position. Electronically Signed   By: David  Martinique M.D.   On: 01/30/2015 07:21     ASSESSMENT / PLAN:  PULMONARY A: Ventilator dependent respiratory failure Acute hypoxemic respiratory failure. Large mediastinal mass - concern for lymphoma Pulmonary nodule - 59mm RLL Remains intubated post operatively.  Plan: Adjust PEEP/FIO2 to maintain O2 saturation > 90%. Reduce sedation as able, changing to precedex PS trials as tolerates Chest tube management per thoracic surgery. Not weaning well at all, may require early tracheostomy  CARDIOVASCULAR: Mediastinal CT >>> Pleural CT >>> Large pericardial effusion with mediastinal mass, now s/p pericardial window and mediastinal mass biopsy Tamponade> likely due to malignant pericardial effusion now s/p pericardial window Hypotension > requiring low dose phenylephrine, possibly hypovolemia vs medical sedation related.  Plan: Follow up path Hold anti-HTN. Wean phenylphrine as able for MAP 65 Changing sedation Managment per CVTS  RENAL A: No acute issues P: Monitor BMET and UOP Replace electrolytes as needed Continue diuresis with Lasix  40 mg IV q8 x2 doses.  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  Patient becomes very agitated and diaphoretic when off sedation.  Switched to precedex at 1.2 currently.  Not arousable but weaning.  Diminished BS on exam on the left.  CXR with poor lung volumes on the left due to mass.  Will increase diureses today.  Hopefully once diuresed enough we will just wake her up and extubate as I do not anticipate she will be able to cooperate with SBT.  No family bedside today.  The patient is critically ill with multiple organ systems failure and requires high complexity decision making for assessment and support, frequent evaluation and titration of therapies, application of advanced monitoring technologies and extensive interpretation of multiple databases.   Critical Care Time devoted to patient care services described in this note is  35  Minutes. This time reflects time of care of this signee Dr Jennet Maduro. This critical care time does not reflect procedure time, or teaching time or supervisory time of PA/NP/Med student/Med Resident etc but could involve care discussion time.  Rush Farmer, M.D. [facility] Pulmonary/Critical Care Medicine. Pager: [phone]. After hours pager: [phone].

## 2015-01-31 NOTE — Progress Notes (Signed)
[place] Progress Note Patient Name: Maria Bauer DOB: [birth year] MRN: [id number]   Date of Service  01/31/2015  HPI/Events of Note  Fever. Recent cu;ltures already sent On abx  eICU Interventions  tylenal     Intervention Category Minor Interventions: Routine modifications to care plan (e.g. PRN medications for pain, fever)  Raylene Miyamoto. 01/31/2015, 6:47 PM

## 2015-01-31 NOTE — Progress Notes (Signed)
5 Days Post-Op Procedure(s) (LRB): PERICARDIAL WINDOW (N/A) MEDIASTINOTOMY CHAMBERLAIN MCNEIL (Left) Subjective: Febrile with thick secretions atelectasis of L lung from large nass Vent dependent No path back on biopsy  Objective: Vital signs in last 24 hours: Temp:  [98.5 F (36.9 C)-102.9 F (39.4 C)] 100 F (37.8 C) (11/09 0745) Pulse Rate:  [28-150] 80 (11/09 0800) Cardiac Rhythm:  [-] Normal sinus rhythm (11/09 0400) Resp:  [16-34] 31 (11/09 0800) BP: (81-156)/(48-81) 117/75 mmHg (11/09 0800) SpO2:  [83 %-100 %] 97 % (11/09 0800) Arterial Line BP: (64-155)/(44-88) 81/71 mmHg (11/09 0800) FiO2 (%):  [40 %] 40 % (11/09 0815) Weight:  [205 lb 0.4 oz (93 kg)] 205 lb 0.4 oz (93 kg) (11/09 0400)  Hemodynamic parameters for last 24 hours:  sinus Neo off  Intake/Output from previous day: 11/08 0701 - 11/09 0700 In: 1361.3 [I.V.:819.5; NG/GT:441.8; IV Piggyback:100] Out: 2355 [Urine:2175; Stool:80; Chest Tube:100] Intake/Output this shift: Total I/O In: 77.6 [I.V.:57.6; NG/GT:20] Out: 95 [Urine:75; Chest Tube:20]  Diminished breath sounds on L  Lab Results:  Recent Labs  01/30/15 0415 01/30/15 1702 01/31/15 0413  WBC 12.5*  --  6.9  HGB 12.0 13.6 11.5*  HCT 38.6 40.0 36.6  PLT 278  --  251   BMET:  Recent Labs  01/30/15 0415 01/30/15 1702 01/31/15 0413  NA 145 144 145  K 4.3 4.6 3.6  CL 112* 107 111  CO2 24  --  25  GLUCOSE 127* 181* 198*  BUN 21* 34* 30*  CREATININE 0.55 0.50 0.52  CALCIUM 9.1  --  9.1    PT/INR: No results for input(s): LABPROT, INR in the last 72 hours. ABG    Component Value Date/Time   PHART 7.493* 01/31/2015 0415   HCO3 25.9* 01/31/2015 0415   TCO2 27 01/31/2015 0415   ACIDBASEDEF 1.0 01/29/2015 0410   O2SAT 90.0 01/31/2015 0415   CBG (last 3)   Recent Labs  01/30/15 2350 01/31/15 0342 01/31/15 0743  GLUCAP 185* 180* 169*    Assessment/Plan: S/P Procedure(s) (LRB): PERICARDIAL WINDOW (N/A) MEDIASTINOTOMY  CHAMBERLAIN MCNEIL (Left) Leave L peural chest tube Vent wean per CCM   LOS: 6 days    Maria Bauer 01/31/2015

## 2015-02-01 ENCOUNTER — Ambulatory Visit (HOSPITAL_COMMUNITY): Payer: No Typology Code available for payment source

## 2015-02-01 ENCOUNTER — Inpatient Hospital Stay (HOSPITAL_COMMUNITY): Payer: Medicare (Managed Care)

## 2015-02-01 DIAGNOSIS — C8332 Diffuse large B-cell lymphoma, intrathoracic lymph nodes: Principal | ICD-10-CM

## 2015-02-01 DIAGNOSIS — I313 Pericardial effusion (noninflammatory): Secondary | ICD-10-CM

## 2015-02-01 DIAGNOSIS — R918 Other nonspecific abnormal finding of lung field: Secondary | ICD-10-CM

## 2015-02-01 DIAGNOSIS — C8330 Diffuse large B-cell lymphoma, unspecified site: Secondary | ICD-10-CM

## 2015-02-01 DIAGNOSIS — J9690 Respiratory failure, unspecified, unspecified whether with hypoxia or hypercapnia: Secondary | ICD-10-CM

## 2015-02-01 LAB — BLOOD GAS, ARTERIAL
Acid-Base Excess: 2.9 mmol/L — ABNORMAL HIGH (ref 0.0–2.0)
Bicarbonate: 26.5 mEq/L — ABNORMAL HIGH (ref 20.0–24.0)
Drawn by: 301361
FIO2: 0.4
MECHVT: 640 mL
O2 Saturation: 91 %
PEEP: 5 cmH2O
Patient temperature: 100.2
RATE: 12 resp/min
TCO2: 27.7 mmol/L (ref 0–100)
pCO2 arterial: 39.2 mmHg (ref 35.0–45.0)
pH, Arterial: 7.449 (ref 7.350–7.450)
pO2, Arterial: 62.8 mmHg — ABNORMAL LOW (ref 80.0–100.0)

## 2015-02-01 LAB — COMPREHENSIVE METABOLIC PANEL
ALT: 20 U/L (ref 14–54)
AST: 22 U/L (ref 15–41)
Albumin: 2 g/dL — ABNORMAL LOW (ref 3.5–5.0)
Alkaline Phosphatase: 81 U/L (ref 38–126)
Anion gap: 7 (ref 5–15)
BUN: 36 mg/dL — ABNORMAL HIGH (ref 6–20)
CO2: 29 mmol/L (ref 22–32)
Calcium: 9.5 mg/dL (ref 8.9–10.3)
Chloride: 112 mmol/L — ABNORMAL HIGH (ref 101–111)
Creatinine, Ser: 0.55 mg/dL (ref 0.44–1.00)
GFR calc Af Amer: >60 mL/min (ref >60)
GFR calc non Af Amer: >60 mL/min (ref >60)
Glucose, Bld: 173 mg/dL — ABNORMAL HIGH (ref 65–99)
Potassium: 3.7 mmol/L (ref 3.5–5.1)
Sodium: 148 mmol/L — ABNORMAL HIGH (ref 135–145)
Total Bilirubin: 0.8 mg/dL (ref 0.3–1.2)
Total Protein: 5.3 g/dL — ABNORMAL LOW (ref 6.5–8.1)

## 2015-02-01 LAB — CBC
HCT: 33.8 % — ABNORMAL LOW (ref 36.0–46.0)
Hemoglobin: 10.5 g/dL — ABNORMAL LOW (ref 12.0–15.0)
MCH: 30.3 pg (ref 26.0–34.0)
MCHC: 31.1 g/dL (ref 30.0–36.0)
MCV: 97.7 fL (ref 78.0–100.0)
Platelets: 273 10*3/uL (ref 150–400)
RBC: 3.46 MIL/uL — ABNORMAL LOW (ref 3.87–5.11)
RDW: 15.3 % (ref 11.5–15.5)
WBC: 6.6 10*3/uL (ref 4.0–10.5)

## 2015-02-01 LAB — GLUCOSE, CAPILLARY
GLUCOSE-CAPILLARY: 138 mg/dL — AB (ref 65–99)
GLUCOSE-CAPILLARY: 148 mg/dL — AB (ref 65–99)
GLUCOSE-CAPILLARY: 160 mg/dL — AB (ref 65–99)
GLUCOSE-CAPILLARY: 194 mg/dL — AB (ref 65–99)
GLUCOSE-CAPILLARY: 271 mg/dL — AB (ref 65–99)
Glucose-Capillary: 206 mg/dL — ABNORMAL HIGH (ref 65–99)

## 2015-02-01 LAB — LACTATE DEHYDROGENASE: LDH: 950 U/L — AB (ref 98–192)

## 2015-02-01 LAB — PROCALCITONIN: Procalcitonin: 4.79 ng/mL

## 2015-02-01 LAB — PHOSPHORUS
Phosphorus: 2.4 mg/dL — ABNORMAL LOW (ref 2.5–4.6)
Phosphorus: 2.8 mg/dL (ref 2.5–4.6)

## 2015-02-01 LAB — MAGNESIUM: Magnesium: 2.3 mg/dL (ref 1.7–2.4)

## 2015-02-01 LAB — URIC ACID: URIC ACID, SERUM: 3.6 mg/dL (ref 2.3–6.6)

## 2015-02-01 LAB — HIV ANTIBODY (ROUTINE TESTING W REFLEX): HIV Screen 4th Generation wRfx: NONREACTIVE

## 2015-02-01 MED ORDER — METHYLPREDNISOLONE SODIUM SUCC 125 MG IJ SOLR
80.0000 mg | Freq: Two times a day (BID) | INTRAMUSCULAR | Status: DC
  Administered 2015-02-01: 80 mg via INTRAVENOUS
  Filled 2015-02-01: qty 2

## 2015-02-01 MED ORDER — PROPOFOL 500 MG/50ML IV EMUL
5.0000 ug/kg/min | Freq: Once | INTRAVENOUS | Status: DC
  Filled 2015-02-01: qty 50

## 2015-02-01 MED ORDER — MIDAZOLAM HCL 2 MG/2ML IJ SOLN: 4.0000 mg | Freq: Once | INTRAMUSCULAR | Status: DC

## 2015-02-01 MED ORDER — FENTANYL CITRATE (PF) 100 MCG/2ML IJ SOLN
200.0000 ug | Freq: Once | INTRAMUSCULAR | Status: DC
Start: 2015-02-01 — End: 2015-02-01

## 2015-02-01 MED ORDER — VECURONIUM BROMIDE 10 MG IV SOLR
10.0000 mg | Freq: Once | INTRAVENOUS | Status: AC
  Administered 2015-02-02: 10 mg via INTRAVENOUS
  Filled 2015-02-01: qty 10

## 2015-02-01 MED ORDER — ALLOPURINOL 100 MG PO TABS
150.0000 mg | ORAL_TABLET | Freq: Two times a day (BID) | ORAL | Status: DC
  Administered 2015-02-01 – 2015-02-02 (×2): 150 mg via ORAL
  Filled 2015-02-01: qty 1
  Filled 2015-02-01: qty 2
  Filled 2015-02-01 (×2): qty 1

## 2015-02-01 MED ORDER — FENTANYL CITRATE (PF) 100 MCG/2ML IJ SOLN
25.0000 ug | INTRAMUSCULAR | Status: DC | PRN
  Administered 2015-02-05 – 2015-02-06 (×3): 100 ug via INTRAVENOUS
  Administered 2015-02-07: 25 ug via INTRAVENOUS

## 2015-02-01 MED ORDER — POTASSIUM CHLORIDE 10 MEQ/50ML IV SOLN
10.0000 meq | INTRAVENOUS | Status: AC
  Administered 2015-02-01 (×3): 10 meq via INTRAVENOUS
  Filled 2015-02-01 (×3): qty 50

## 2015-02-01 MED ORDER — PROPOFOL 500 MG/50ML IV EMUL: 5.0000 ug/kg/min | Freq: Once | INTRAVENOUS | Status: DC

## 2015-02-01 MED ORDER — VECURONIUM BROMIDE 10 MG IV SOLR: 10.0000 mg | Freq: Once | INTRAVENOUS | Status: DC

## 2015-02-01 MED ORDER — METHYLPREDNISOLONE SODIUM SUCC 125 MG IJ SOLR
125.0000 mg | Freq: Two times a day (BID) | INTRAMUSCULAR | Status: AC
  Administered 2015-02-01 – 2015-02-06 (×10): 125 mg via INTRAVENOUS
  Filled 2015-02-01 (×12): qty 2

## 2015-02-01 MED ORDER — INSULIN DETEMIR 100 UNIT/ML ~~LOC~~ SOLN
18.0000 [IU] | Freq: Every day | SUBCUTANEOUS | Status: DC
  Administered 2015-02-01 – 2015-02-03 (×2): 18 [IU] via SUBCUTANEOUS
  Filled 2015-02-01 (×4): qty 0.18

## 2015-02-01 MED ORDER — POTASSIUM CHLORIDE 20 MEQ/15ML (10%) PO SOLN
40.0000 meq | Freq: Three times a day (TID) | ORAL | Status: AC
  Administered 2015-02-01 (×2): 40 meq
  Filled 2015-02-01 (×3): qty 30

## 2015-02-01 MED ORDER — ACETAMINOPHEN 10 MG/ML IV SOLN
1000.0000 mg | Freq: Four times a day (QID) | INTRAVENOUS | Status: AC
  Administered 2015-02-01: 1000 mg via INTRAVENOUS
  Filled 2015-02-01: qty 100

## 2015-02-01 MED ORDER — IOHEXOL 300 MG/ML  SOLN
25.0000 mL | INTRAMUSCULAR | Status: AC
  Administered 2015-02-01 (×2): 25 mL via ORAL

## 2015-02-01 MED ORDER — SODIUM CHLORIDE 0.9 % IV SOLN
0.0000 ug/h | INTRAVENOUS | Status: DC
  Administered 2015-02-01: 25 ug/h via INTRAVENOUS
  Administered 2015-02-02: 100 ug/h via INTRAVENOUS
  Administered 2015-02-03: 50 ug/h via INTRAVENOUS
  Administered 2015-02-03 (×2): 200 ug/h via INTRAVENOUS
  Administered 2015-02-04: 25 ug/h via INTRAVENOUS
  Administered 2015-02-04: 200 ug/h via INTRAVENOUS
  Administered 2015-02-05 (×2): 25 ug/h via INTRAVENOUS
  Administered 2015-02-05 – 2015-02-07 (×4): 200 ug/h via INTRAVENOUS
  Filled 2015-02-01 (×9): qty 50

## 2015-02-01 MED ORDER — MIDAZOLAM HCL 2 MG/2ML IJ SOLN
4.0000 mg | Freq: Once | INTRAMUSCULAR | Status: AC
  Administered 2015-02-02: 2 mg via INTRAVENOUS
  Filled 2015-02-01: qty 4

## 2015-02-01 MED ORDER — DEXTROSE 5 % IV SOLN
INTRAVENOUS | Status: DC
  Administered 2015-02-01: 30 mL via INTRAVENOUS

## 2015-02-01 MED ORDER — FENTANYL CITRATE (PF) 100 MCG/2ML IJ SOLN: 25.0000 ug | INTRAMUSCULAR | Status: DC | PRN

## 2015-02-01 MED ORDER — ETOMIDATE 2 MG/ML IV SOLN
40.0000 mg | Freq: Once | INTRAVENOUS | Status: DC
  Filled 2015-02-01: qty 20

## 2015-02-01 MED ORDER — FENTANYL CITRATE (PF) 100 MCG/2ML IJ SOLN
200.0000 ug | Freq: Once | INTRAMUSCULAR | Status: AC
  Administered 2015-02-02: 100 ug via INTRAVENOUS
  Filled 2015-02-01: qty 4

## 2015-02-01 MED ORDER — ETOMIDATE 2 MG/ML IV SOLN
40.0000 mg | Freq: Once | INTRAVENOUS | Status: AC
  Administered 2015-02-02: 20 mg via INTRAVENOUS
  Filled 2015-02-01 (×2): qty 20

## 2015-02-01 MED ORDER — FUROSEMIDE 10 MG/ML IJ SOLN
40.0000 mg | Freq: Once | INTRAMUSCULAR | Status: AC
  Administered 2015-02-01: 40 mg via INTRAVENOUS
  Filled 2015-02-01: qty 4

## 2015-02-01 MED ORDER — FUROSEMIDE 10 MG/ML IJ SOLN
40.0000 mg | Freq: Three times a day (TID) | INTRAMUSCULAR | Status: AC
  Administered 2015-02-01 (×2): 40 mg via INTRAVENOUS
  Filled 2015-02-01 (×2): qty 4

## 2015-02-01 NOTE — Progress Notes (Signed)
6 Days Post-Op Procedure(s) (LRB): PERICARDIAL WINDOW (N/A) MEDIASTINOTOMY CHAMBERLAIN MCNEIL (Left) Subjective: B cell lymphoma mediastinum, pericardium Acute respiratory insufficiency on vent Febrile w/ possible VAP Not ready to wean yet Cont tube feeds May need trach Objective: Vital signs in last 24 hours: Temp:  [99.4 F (37.4 C)-102.2 F (39 C)] 100.7 F (38.2 C) (11/10 0807) Pulse Rate:  [72-106] 72 (11/10 0732) Cardiac Rhythm:  [-] Normal sinus rhythm (11/10 0400) Resp:  [18-46] 21 (11/10 0732) BP: (99-153)/(28-97) 99/36 mmHg (11/10 0732) SpO2:  [90 %-99 %] 95 % (11/10 0732) Arterial Line BP: (77-93)/(56-83) 87/72 mmHg (11/09 1700) FiO2 (%):  [40 %] 40 % (11/10 0732) Weight:  [206 lb 5.6 oz (93.6 kg)] 206 lb 5.6 oz (93.6 kg) (11/10 0500)  Hemodynamic parameters for last 24 hours:  nsr   Temp 100-102  Intake/Output from previous day: 11/09 0701 - 11/10 0700 In: 2255.5 [I.V.:739.5; NG/GT:1216; IV Piggyback:300] Out: 2335 [Urine:2185; Stool:100; Chest Tube:50] Intake/Output this shift: Total I/O In: 50 [IV Piggyback:50] Out: -   Decreased breath sounds on L  Lab Results:  Recent Labs  01/31/15 0413 02/01/15 0500  WBC 6.9 6.6  HGB 11.5* 10.5*  HCT 36.6 33.8*  PLT 251 273   BMET:  Recent Labs  01/31/15 0413 02/01/15 0500  NA 145 148*  K 3.6 3.7  CL 111 112*  CO2 25 29  GLUCOSE 198* 173*  BUN 30* 36*  CREATININE 0.52 0.55  CALCIUM 9.1 9.5    PT/INR: No results for input(s): LABPROT, INR in the last 72 hours. ABG    Component Value Date/Time   PHART 7.449 02/01/2015 0540   HCO3 26.5* 02/01/2015 0540   TCO2 27.7 02/01/2015 0540   ACIDBASEDEF 1.0 01/29/2015 0410   O2SAT 91.0 02/01/2015 0540   CBG (last 3)   Recent Labs  01/31/15 2336 02/01/15 0437 02/01/15 0804  GLUCAP 182* 148* 160*    Assessment/Plan: S/P Procedure(s) (LRB): PERICARDIAL WINDOW (N/A) MEDIASTINOTOMY CHAMBERLAIN MCNEIL (Left) Diuresis Diabetes control Continue  ABX therapy due to Post-op infection Continue foley due to patient critically ill add free water for increasing sodium   LOS: 7 days    Tharon Aquas Trigt III 02/01/2015

## 2015-02-01 NOTE — Progress Notes (Signed)
Spoke with daughter, husband and son in law, discussed case along with H/O MD, after discussion, will proceed with tracheostomy in AM then transfer to [facility] 2 hours later for chemo.  Consent acquired.  Tentative 11/11 at 10 AM.  Rush Farmer, M.D. [facility] Pulmonary/Critical Care Medicine. Pager: [phone]. After hours pager: [phone].

## 2015-02-01 NOTE — Progress Notes (Signed)
Name: Maria Bauer MRN: [id number] DOB: [birth year]    ADMISSION DATE:  02/07/2015  CONSULTATION DATE:  02/04/2015  REFERRING MD :  Dr. Harrington Challenger  CHIEF COMPLAINT:  Pericardial effusion  BRIEF PATIENT DESCRIPTION: 69 year old female with recent diagnosis of mediastinal mass, was seen by cardiothoracic surgery in the outpatient setting who referred her for an echocardiogram 11/3. Echocardiogram findings included large pericardial effusion. Due to persistent cough she is considered high risk for pericardiocentesis and requires admission for cough suppression prior to procedure. PCCM asked to see in consultation.  SIGNIFICANT EVENTS  10/27 evaluated in ED for cough. Prior CXR from outpatient noted with possible chest mass. 11/2 seen by Dr. Prescott Gum as outpatient regarding CT findings, appears to be non-resectable mass 11/3 presented for echo, large effusion found, admitted for drainage.  11/4 to OR for pericardial window and mediastinal mass biopsy. 11/6 Remains intubated due to hypoxemia. 11/7 Remains intubated, very agitated with WUA, unable to wean. Still on low dose Neo 11/8 febrile overnight, cultures sent. Thick ETT secretions. Antibiotics started by TCTS.  STUDIES:  CT chest (10/28) > 12.0 x 8.8 cm abnormal soft tissue density seen in anterior mediastinum concerning for adenopathy related to lymphoma or other metastatic disease. Some compression of the left innominate vein is noted as a result. 5 mm nodule is noted posteriorly in the right lower lobe. This may represent metastatic focus given above finding. Alternatively, it may be unrelated. Continued CT follow-up is recommended to ensure stability. Moderate pericardial effusion is noted as well. 11/3 echo> LVEF 55-60%, pericardium with large effusion, pericardial mass? Tamponade noted  SUBJECTIVE:  Very agitated on wake up assessment.  VITAL SIGNS: Temp:  [99.4 F (37.4 C)-102.2 F (39 C)] 100.7 F (38.2 C) (11/10 0807) Pulse Rate:   [72-106] 73 (11/10 1200) Resp:  [20-52] 20 (11/10 1200) BP: (90-153)/(28-97) 90/60 mmHg (11/10 1200) SpO2:  [92 %-98 %] 96 % (11/10 1200) Arterial Line BP: (81-93)/(66-83) 87/72 mmHg (11/09 1700) FiO2 (%):  [40 %] 40 % (11/10 1200) Weight:  [93.6 kg (206 lb 5.6 oz)] 93.6 kg (206 lb 5.6 oz) (11/10 0500)  PHYSICAL EXAMINATION:  General:  Sedated on vent, not following command. HENT: ETT tube in place, [place]/AT, PERRL PULM: Clear R, diminished/near absent on L CV: RRR, distant, extremities well perfused GI: obese, BS+, soft, non-distended MSK: normal bulk, no bony deformities Neuro: sedated on vent  Recent Labs Lab 01/30/15 0415 01/30/15 1702 01/31/15 0413 02/01/15 0500  NA 145 144 145 148*  K 4.3 4.6 3.6 3.7  CL 112* 107 111 112*  CO2 24  --  25 29  BUN 21* 34* 30* 36*  CREATININE 0.55 0.50 0.52 0.55  GLUCOSE 127* 181* 198* 173*    Recent Labs Lab 01/30/15 0415 01/30/15 1702 01/31/15 0413 02/01/15 0500  HGB 12.0 13.6 11.5* 10.5*  HCT 38.6 40.0 36.6 33.8*  WBC 12.5*  --  6.9 6.6  PLT 278  --  251 273   Dg Chest Port 1 View  02/01/2015  CLINICAL DATA:  Pericardial window.  Pericardial effusion. EXAM: PORTABLE CHEST 1 VIEW COMPARISON:  01/31/2015 FINDINGS: Endotracheal tube in good position. NG tube in place with the tip not visualized due to underpenetration. Right jugular central venous catheter tip in the SVC. No pneumothorax on the right Interval improvement in aeration of the left lung with decrease in density left lower lobe. This may be due to decreased diffusion atelectasis. Left chest tube remains in place. No pneumothorax on the left.  Cardiac enlargement has improved. Right lower lobe atelectasis is present. IMPRESSION: Endotracheal tube in good position. Decreased left lower lobe density with improved aeration of the left lung. Left chest tube in place without pneumothorax. Electronically Signed   By: Franchot Gallo M.D.   On: 02/01/2015 07:16   Dg Chest Port 1  View  01/31/2015  CLINICAL DATA:  Status post median sternotomy and pericardial window creation for pleural effusion, acute respiratory failure, cardiac tamponade, mediastinal mass. EXAM: PORTABLE CHEST 1 VIEW COMPARISON:  Portable chest x-ray of January 27, 2015 FINDINGS: The trachea has apparently been extubated. There is linear radiodensity that projects over the lower neck which could be a portion of the endotracheal tube but if so it is positioned quite high. The right lung remains well-expanded and clear. On the left only a small amount of aerated lung persists in the apex and laterally. The left chest tube is stable. The large mediastinal mass is unchanged. There is no pneumothorax nor definite pleural effusion. The esophagogastric tube tip projects below the inferior margin of the image. The right internal jugular venous catheter tip projects over the midportion of the SVC. IMPRESSION: Stable appearance of the chest following extubation of the trachea. Persistent large mediastinal mass. There is no pneumothorax. Electronically Signed   By: David  Martinique M.D.   On: 01/31/2015 07:33   Dg Chest Port 1 View  01/30/2015  CLINICAL DATA:  Persistent fever. Status post pericardial window and mediastinal biopsy. EXAM: PORTABLE CHEST 1 VIEW COMPARISON:  Multiple chest x-rays and chest CT from 01/19/2015 FINDINGS: The need support apparatus is stable. The endotracheal tube is 4.3 cm above the carina. The NG tube is stable. The left-sided chest tube is stable. Persistent large mediastinal mass occupying good portion of the left hemi thorax. Persistent vascular congestion and bibasilar atelectasis. IMPRESSION: Stable support apparatus. Stable left-sided chest tube.  No definite pneumothorax. Stable large mass occupying a good portion of the left hemi thorax. Electronically Signed   By: Marijo Sanes M.D.   On: 01/30/2015 23:30     ASSESSMENT / PLAN:  PULMONARY A: Ventilator dependent respiratory failure Acute  hypoxemic respiratory failure. Large mediastinal mass - concern for lymphoma Pulmonary nodule - 76mm RLL Remains intubated post operatively.  Plan: Adjust PEEP/FIO2 to maintain O2 saturation > 90%. Reduce sedation as able, changed to precedex PS trials as tolerates Chest tube management per thoracic surgery. Spoke with CVTS, ok with trach, daughter to arrive at 3 PM for discussion regarding trach in AM.  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  Plan: Follow up path. Hold anti-HTN. Wean phenylphrine as able for MAP 65. Changing sedation. Managment per CVTS. Diureses as below.  RENAL A: No acute issues P: Monitor BMET and UOP Replace electrolytes as needed Continue diuresis with Lasix 40 mg IV q8 x2 doses.  GI:  A: no acute issues P:  OG tube Pepcid for stress ulcer prophylaxis TF  ID:  A: Continued fever Copious ETT secretions  ?HCAP/VAP  P: BC 11/9 > UC 11/9 > Sputum 11/9 > Ceftazidime 11/9 > PCT noted  Neuro:  A: Post op pain P: RASS goal -1 Fentanyl prn Precedex for sedation.  Nurse contacted daughter who is coming in at 3 PM and we will discuss trach as  I feel that without the tracheostomy patient will not be able to have radiation needed to shrink the tumor and facilitate weaning from the ventilator.  Oncology has been called for B-cell lymphoma diagnosis.  The patient is critically ill with multiple organ systems failure and requires high complexity decision making for assessment and support, frequent evaluation and titration of therapies, application of advanced monitoring technologies and extensive interpretation of multiple databases.   Critical Care Time  devoted to patient care services described in this note is  35  Minutes. This time reflects time of care of this signee Dr Jennet Maduro. This critical care time does not reflect procedure time, or teaching time or supervisory time of PA/NP/Med student/Med Resident etc but could involve care discussion time.  Rush Farmer, M.D. [facility] Pulmonary/Critical Care Medicine. Pager: [phone]. After hours pager: [phone].

## 2015-02-01 NOTE — Care Management Important Message (Signed)
Important Message  Patient Details  Name: Maria Bauer MRN: [id number] Date of Birth: 11-Jul-1945   Medicare Important Message Given:  Yes    [name] 02/01/2015, 3:14 PM

## 2015-02-01 NOTE — Consult Note (Signed)
Maria Bauer                                MR#: [id number]  DOB: 04-29-1945                       CSN#: [id number]  Referring MD: Dr. Sheliah [facility]  Patient Care Team: Angelica Pou, MD as PCP - General (Internal Medicine)  Reason for Consult: Lymphoma   NVB:TYOM Aro is a 69 y.o. female admitted on 11/2  with 1 week of progressive shortness of breath and orthopnea, persistent cough, complicated with hypoxemia. Prior to admission, she had been found to have, per CT chest as outpatient, a 12.0 x 8.8 cm abnormal soft tissue density seen in anterior mediastinum concerning for adenopathy related to lymphoma or other metastatic disease. Some compression of the left innominate vein is noted as a result. Moderate pericardial effusion is noted as well. 5 mm nodule is noted posteriorly in the right lower lobe.. She was referred to [facility], Dr. Nils Pyle, who felt this mass was non resectable. Biopsy was recommended at the time, but due to her symptoms, she was admitted for management and further workup. A 2 D echo on 11/3 was suspicious for cardiac tamponade There was some nodularity along epicardial surface,question tumor IVC is dilated with blunted respiratory variation. On 11/4 she had a pericardial window and mediastinal mass biopsy. 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  Flow cytometric analysis was performed ([id number]), and shows a monoclonal kappa restricted B-cell population expressing limited B-cell antigens including CD22 and CD19 but with the lack of obvious CD20 positivity. Immunohistochemical stains were performed and show that the atypical lymphoid cells are positive for LCA, CD79a, BCL2 and cytoplasmic kappa associated with partial positivity for CD20. CD10 only shows scattering of a few positive cells. CD138 shows patchy, very weak staining although  the majority of the cells are negative. No significant staining is seen for CD10, BCL6, CD30, CD34, TdT, CD117, EMA, CD56 or lambda. There is an admixed minor T-cell population in the background as seen with CD3, CD5 and CD43. There is no apparent co-expression of CD5 in B-cell areas.  Risk factors include rheumathoid arthritis on Rheumatrex.  She is currently on ventilator, and further history cannot be obtained.  We were requested to see the patient in consultation with recommendations.   PMH:  Past Medical History  Diagnosis Date  . High cholesterol   . Hypertension   . Acid reflux   . Insomnia   . Scoliosis   . Asthma     "stopped in the 1990's"  . Anemia   . Peptic ulcer   . Daily headache   . Rheumatoid arthritis ([place])     "everywhere" (02/17/2015)  . Chronic lower back pain     "mostly on the right" (02/05/2015)  . Anxiety   . Melanoma ([place]) 11/2011    "left forearm"    Surgeries:  Past Surgical History  Procedure Laterality Date  . Appendectomy    . Total knee arthroplasty Bilateral   . Joint replacement    . Total abdominal hysterectomy    . Melanoma excision Left 11/2011    "forearm"  . Lymph node biopsy      "around the back of neck"  . Axillary lymph  node biopsy Left   . Inguinal lymph node biopsy Left   . Pericardial window N/A 02/02/2015    Procedure: PERICARDIAL WINDOW;  Surgeon: Ivin Poot, MD;  Location: [facility];  Service: Thoracic;  Laterality: N/A;  . Mediastinotomy chamberlain mcneil Left 01/31/2015    Procedure: MEDIASTINOTOMY CHAMBERLAIN MCNEIL;  Surgeon: Ivin Poot, MD;  Location: [facility];  Service: Thoracic;  Laterality: Left;    Allergies:  Allergies  Allergen Reactions  . Morphine And Related Hives and Itching  . Norco [Hydrocodone-Acetaminophen] Hives and Itching    Medications:   Prior to Admission:  Prescriptions prior to admission  Medication Sig Dispense Refill Last Dose  . albuterol (PROVENTIL) (5 MG/ML) 0.5% nebulizer  solution Take 2.5 mg by nebulization every 6 (six) hours as needed for wheezing or shortness of breath.   01/24/2015 at Unknown time  . alendronate (FOSAMAX) 70 MG tablet Take 70 mg by mouth once a week. Take with a full glass of water on an empty stomach. Take along with methotrexate.   01/20/2015 at Unknown time  . Ascorbic Acid (VITAMIN C) 1000 MG tablet Take 1,000 mg by mouth daily.   02/12/2015 at Unknown time  . benzonatate (TESSALON) 100 MG capsule TAKE ONE CAPSULE BY MOUTH 3 TIMES A DAY (NOT COVERED BY INSURANCE)  1 02/19/2015 at Unknown time  . Calcium Carbonate-Vit D-Min (CALCIUM 1200 PO) Take 1 tablet by mouth.   02/02/2015 at Unknown time  . folic acid (FOLVITE) 1 MG tablet Take 1 mg by mouth daily.   02/08/2015 at Unknown time  . Hydrocod Polst-Chlorphen Polst (TUSSIONEX PENNKINETIC ER PO) Take 10 mg by mouth.   01/27/2015 at Unknown time  . LORazepam (ATIVAN) 0.5 MG tablet Take by mouth every 8 (eight) hours.   01/24/2015 at Unknown time  . methotrexate (RHEUMATREX) 2.5 MG tablet Take 15 mg by mouth once a week. Caution:Chemotherapy. Protect from light. Take 6 tablets weekly.   01/20/2015 at Unknown time  . metoprolol (LOPRESSOR) 50 MG tablet Take 50 mg by mouth 2 (two) times daily.   02/09/2015 at 0900  . predniSONE (DELTASONE) 10 MG tablet Take 20 mg by mouth daily with breakfast.    02/04/2015 at Unknown time  . simvastatin (ZOCOR) 20 MG tablet Take 20 mg by mouth daily.   01/24/2015 at Unknown time  . Vitamin D, Ergocalciferol, (DRISDOL) 50000 UNITS CAPS capsule Take 50,000 Units by mouth every 30 (thirty) days.   Past Month at Unknown time  . acetaminophen (TYLENOL) 325 MG tablet Take 650 mg by mouth every 6 (six) hours as needed for moderate pain.   unknown at Unknown time  . ranitidine (ZANTAC) 150 MG tablet Take 150 mg by mouth 2 (two) times daily.   unknown    CXK:GYJEHUDJ (SUBLIMAZE) injection, fentaNYL (SUBLIMAZE) injection, ondansetron (ZOFRAN) IV, potassium chloride  ROS: Unable  to obtain, patient sedated and ventilated  Family History:    Family History  Problem Relation Age of Onset  . Cancer Sister 50    BREAST    No family history of hematological  disorders.  Social History:  reports that she has never smoked. She has never used smokeless tobacco. She reports that she does not drink alcohol or use illicit drugs.   Physical Exam:  Filed Vitals:   02/01/15 1218  BP:   Pulse:   Temp: 99.8 F (37.7 C)  Resp:    Filed Weights   01/30/15 0300 01/31/15 0400 02/01/15 0500  Weight: 211 lb  6.7 oz (95.9 kg) 205 lb 0.4 oz (93 kg) 206 lb 5.6 oz (93.6 kg)   ECOG PERFORMANCE STATUS:    GENERAL intubated and sedated, ill appearing SKIN: skin color, texture, turgor are normal, no rashes or significant lesions EYES: normal, conjunctiva are pink and non-injected, sclera clear OROPHARYNX:no exudate, no erythema and lips, buccal mucosa, and tongue normal  NECK: supple, thyroid normal size, non-tender, without nodularity LYMPH:  no palpable lymphadenopathy in the cervical, axillary or inguinal area LUNGS: decreased breath sounds on the right, absent sounds on the right HEART: regular rate & rhythm and no murmurs and no lower extremity edema ABDOMEN:soft, non-tender and normal bowel sounds Musculoskeletal:no cyanosis of digits and no clubbing  NEURO:sedated and ventilated  Labs:  CBC   Recent Labs Lab 01/28/15 0424  01/29/15 0441 01/29/15 1532 01/30/15 0415 01/30/15 1702 01/31/15 0413 02/01/15 0500  WBC 9.5  --  12.2*  --  12.5*  --  6.9 6.6  HGB 11.5*  < > 11.5* 12.2 12.0 13.6 11.5* 10.5*  HCT 36.3  < > 36.4 36.0 38.6 40.0 36.6 33.8*  PLT 196  --  207  --  278  --  251 273  MCV 96.5  --  97.1  --  97.2  --  97.1 97.7  MCH 30.6  --  30.7  --  30.2  --  30.5 30.3  MCHC 31.7  --  31.6  --  31.1  --  31.4 31.1  RDW 14.9  --  15.1  --  15.2  --  15.0 15.3  < > = values in this interval not displayed.   CMP    Recent Labs Lab 02/19/2015 1546   01/28/15 0424  01/29/15 0441 01/29/15 1532 01/30/15 0415 01/30/15 1702 01/31/15 0413 02/01/15 0500  NA 142  < > 142  < > 141 144 145 144 145 148*  K 3.6  < > 3.7  < > 3.5 4.6 4.3 4.6 3.6 3.7  CL 106  < > 109  < > 111 112* 112* 107 111 112*  CO2 23  < > 25  --  21*  --  24  --  25 29  GLUCOSE 90  < > 128*  < > 124* 134* 127* 181* 198* 173*  BUN 19  < > 6  < > 13 15 21* 34* 30* 36*  CREATININE 0.67  < > 0.60  < > 0.48 0.50 0.55 0.50 0.52 0.55  CALCIUM 10.3  < > 8.3*  --  8.2*  --  9.1  --  9.1 9.5  MG  --   --   --   --   --   --  2.1  --  2.2 2.3  AST 29  --  34  --   --   --  36  --  26 22  ALT 58*  --  32  --   --   --  24  --  19 20  ALKPHOS 49  --  43  --   --   --  60  --  75 81  BILITOT 0.8  --  0.8  --   --   --  1.1  --  1.2 0.8  < > = values in this interval not displayed.      Component Value Date/Time   BILITOT 0.8 02/01/2015 0500      Recent Labs Lab 02/06/2015 2245 01/30/2015 1546  INR 1.18 1.24  No results for input(s): DDIMER in the last 72 hours.   Anemia panel:  No results for input(s): VITAMINB12, FOLATE, FERRITIN, TIBC, IRON, RETICCTPCT in the last 72 hours.   Imaging Studies:  Dg Chest 2 View  01/17/2015  CLINICAL DATA:  Cough and shortness of breath for the past several days EXAM: CHEST  2 VIEW COMPARISON:  None in PACs FINDINGS: There is an abnormal soft tissue mass arising from the left hilar and suprahilar regions adjacent to the AP window. The aortic knob remains reasonably well demonstrated. The cardiac silhouette is enlarged. The pulmonary vascularity is not engorged. The lungs are well-expanded. There is no focal infiltrate nor significant pleural effusion. There is moderate dextrocurvature of the thoracic spine centered at approximately T8. IMPRESSION: Large abnormal anterior mediastinal mass. This may be neoplastic or vascular in nature. CT scanning of the chest now is recommended. These results will be called to the ordering clinician or  representative by the Radiologist Assistant, and communication documented in the PACS or zVision Dashboard. Electronically Signed   By: David  Martinique M.D.   On: 01/17/2015 11:15   Ct Chest W Contrast  01/19/2015  CLINICAL DATA:  Cough, shortness of breath, possible chest mass. EXAM: CT CHEST WITH CONTRAST TECHNIQUE: Multidetector CT imaging of the chest was performed during intravenous contrast administration. CONTRAST:  42m ISOVUE-300 IOPAMIDOL (ISOVUE-300) INJECTION 61% COMPARISON:  January 17, 2015. FINDINGS: No pneumothorax or pleural effusion is noted. 5 mm nodule is noted posteriorly in the right lower lobe best seen on image number 27 of series 4. Atherosclerosis of thoracic aorta is noted without aneurysm or dissection. Great vessels are widely patent. Mild coronary artery calcifications are noted. Moderate pericardial effusion is noted. Visualized portion of upper abdomen is unremarkable. Abnormal soft tissue density is noted in the anterior mediastinum that measures 12.0 x 8.8 cm. This is noted to compress a portion of the left innominate vein. This is most consistent with adenopathy consistent with lymphoma or metastatic disease. No significant osseous abnormality is noted. IMPRESSION: 12.0 x 8.8 cm abnormal soft tissue density seen in anterior mediastinum concerning for adenopathy related to lymphoma or other metastatic disease. Some compression of the left innominate vein is noted as a result. Moderate pericardial effusion is noted as well. 5 mm nodule is noted posteriorly in the right lower lobe. This may represent metastatic focus given above finding. Alternatively, it may be unrelated. Continued CT follow-up is recommended to ensure stability. These results will be called to the ordering clinician or representative by the Radiologist Assistant, and communication documented in the PACS or zVision Dashboard. Electronically Signed   By: JMarijo Conception M.D.   On: 01/19/2015 12:07   Dg Chest Port  1 View  02/01/2015  CLINICAL DATA:  Pericardial window.  Pericardial effusion. EXAM: PORTABLE CHEST 1 VIEW COMPARISON:  01/31/2015 FINDINGS: Endotracheal tube in good position. NG tube in place with the tip not visualized due to underpenetration. Right jugular central venous catheter tip in the SVC. No pneumothorax on the right Interval improvement in aeration of the left lung with decrease in density left lower lobe. This may be due to decreased diffusion atelectasis. Left chest tube remains in place. No pneumothorax on the left. Cardiac enlargement has improved. Right lower lobe atelectasis is present. IMPRESSION: Endotracheal tube in good position. Decreased left lower lobe density with improved aeration of the left lung. Left chest tube in place without pneumothorax. Electronically Signed   By: CFranchot GalloM.D.   On:  02/01/2015 07:16   Dg Chest Port 1 View  01/31/2015  CLINICAL DATA:  Status post median sternotomy and pericardial window creation for pleural effusion, acute respiratory failure, cardiac tamponade, mediastinal mass. EXAM: PORTABLE CHEST 1 VIEW COMPARISON:  Portable chest x-ray of January 27, 2015 FINDINGS: The trachea has apparently been extubated. There is linear radiodensity that projects over the lower neck which could be a portion of the endotracheal tube but if so it is positioned quite high. The right lung remains well-expanded and clear. On the left only a small amount of aerated lung persists in the apex and laterally. The left chest tube is stable. The large mediastinal mass is unchanged. There is no pneumothorax nor definite pleural effusion. The esophagogastric tube tip projects below the inferior margin of the image. The right internal jugular venous catheter tip projects over the midportion of the SVC. IMPRESSION: Stable appearance of the chest following extubation of the trachea. Persistent large mediastinal mass. There is no pneumothorax. Electronically Signed   By: David   Martinique M.D.   On: 01/31/2015 07:33   Dg Chest Port 1 View  01/30/2015  CLINICAL DATA:  Persistent fever. Status post pericardial window and mediastinal biopsy. EXAM: PORTABLE CHEST 1 VIEW COMPARISON:  Multiple chest x-rays and chest CT from 01/19/2015 FINDINGS: The need support apparatus is stable. The endotracheal tube is 4.3 cm above the carina. The NG tube is stable. The left-sided chest tube is stable. Persistent large mediastinal mass occupying good portion of the left hemi thorax. Persistent vascular congestion and bibasilar atelectasis. IMPRESSION: Stable support apparatus. Stable left-sided chest tube.  No definite pneumothorax. Stable large mass occupying a good portion of the left hemi thorax. Electronically Signed   By: Marijo Sanes M.D.   On: 01/30/2015 23:30   Dg Chest Port 1 View  01/30/2015  CLINICAL DATA:  Intubated patient, acute respiratory failure and hypoxia, history of mediastinal mass, pericardial effusion and cardiac tamponade EXAM: PORTABLE CHEST 1 VIEW COMPARISON:  Portable chest x-ray of January 29, 2015. FINDINGS: Stable volume loss on the left with large mediastinal mass. The left-sided chest tubes are unchanged in position. There is no pneumothorax nor large pleural effusion. The right lung is well-expanded. Mild interstitial prominence throughout the right lung is stable. The pulmonary vascularity is not engorged. The endotracheal tube tip lies 3.8 cm above the carina. The right internal jugular venous catheter tip projects over the midportion of the SVC. The esophagogastric tube tip projects below the inferior margin of the image. IMPRESSION: There has been no significant change in the appearance of the chest since yesterday's study. The support tubes are in reasonable position. Electronically Signed   By: David  Martinique M.D.   On: 01/30/2015 07:21   Dg Chest Port 1 View  01/29/2015  CLINICAL DATA:  Shortness of breath, acute respiratory failure, intubated patient, history of  mediastinal mass, pericardial effusion, and cardiac tamponade. EXAM: PORTABLE CHEST 1 VIEW COMPARISON:  Portable chest x-ray of January 28, 2015 FINDINGS: The right lung is well-expanded. There is a trace of pleural fluid at the right lung base. On the left the cardiac silhouette occupies much of the hemi thorax. A small amount of aerated lung in the apex and inferior laterally is still demonstrated. The endotracheal tube tip projects approximately 3.7 cm above the carina. The esophagogastric tube tip projects below the inferior margin of the image. A left-sided chest tube has its tip located over the lateral aspect of the approximately 6 rib and is stable.  A second tube versus the tip of the esophagogastric tube is noted medially at the left lung base. IMPRESSION: Persistent large mediastinal mass on the left. Slight interval worsening in volume loss on the left. The cardiac silhouette remains enlarged. No pneumothorax nor large left pleural effusion is observed. The right lung is largely clear. Electronically Signed   By: David  Martinique M.D.   On: 01/29/2015 07:25   Dg Chest Port 1 View  01/28/2015  CLINICAL DATA:  ET tube placement EXAM: PORTABLE CHEST 1 VIEW COMPARISON:  01/27/15 FINDINGS: The ET tube tip is stable above the carina. There is a right IJ catheter with tip in the projection of the SVC. Left-sided chest tube is identified. No pneumothorax. Stable cardiac enlargement and stable large anterior mediastinal mass. No change in small bilateral pleural effusions and pulmonary edema consistent with CHF. IMPRESSION: 1. Left chest tube in place without visible pneumothorax. 2. Large anterior mediastinal mass 3. Bilateral pleural effusions and pulmonary edema. Electronically Signed   By: Kerby Moors M.D.   On: 01/28/2015 08:28   Dg Chest Port 1 View  01/27/2015  CLINICAL DATA:  Pericardial effusion EXAM: PORTABLE CHEST 1 VIEW COMPARISON:  01/27/2015 FINDINGS: ET tube tip is stable above the carina.  There is a right IJ catheter with tip in the projection of the SVC. Left chest tube is in place. No pneumothorax. NG tube tip is in the stomach. Cardiac enlargement and large anterior mediastinal mass is again noted. Unchanged from previous exam. Increase in pleural fluid noted bilaterally. IMPRESSION: 1. Stable support apparatus. 2. Left chest tube without pneumothorax. 3. Increase in bilateral pleural effusions. Electronically Signed   By: Kerby Moors M.D.   On: 01/27/2015 09:00   Dg Chest Portable 1 View  02/03/2015  CLINICAL DATA:  Status post pericardial window and Chamberlain procedure for large mediastinal mass with associated pericardial effusion. EXAM: PORTABLE CHEST 1 VIEW COMPARISON:  CT of the chest on 01/19/2015 FINDINGS: Endotracheal tube present with the tip approximately 2.5 cm above the carina. Right jugular central line has been placed with the catheter tip in the SVC. Pericardial drain and left-sided chest tube present. Nasogastric tube extends into the distal esophagus and does not extend below the diaphragm. No pneumothorax identified. Lung volumes are very low bilaterally. Huge mediastinal mass again visualized which is more prominent to the left of midline. IMPRESSION: 1. No evidence of pneumothorax postoperatively. 2. Low lung volumes. 3. Nasogastric tube tip lies in the distal esophagus. Electronically Signed   By: Aletta Edouard M.D.   On: 02/20/2015 14:30   Patient: CYANNA, NEACE Collected: 01/28/2015 Client: [facility] Accession: [id number] Received: 01/30/2015 Tharon Aquas Trigt DOB: 12/19/1945 Age: 42 Gender: F Reported: 01/31/2015 [address] Patient Ph: [phone] MRN #: [id number] [place], [place] [address] Visit #: [id number] Chart #: Phone: [phone] Fax: CC: Vernell Leep, MD REPORT OF SURGICAL PATHOLOGY FINAL DIAGNOSIS Diagnosis 1. Pericardium, biopsy, Pericardial tissue - NO DEFINITE TUMOR IDENTIFIED. 2. Pericardium, biopsy, Pericardial  tissue - FOCAL AND SCANTY ATYPICAL LYMPHOID CLUSTERS, SEE COMMENT. 3. Mediastinum, biopsy, Mediastinal - DIFFUSE LARGE B-CELL LYMPHOMA. 4. Mediastinum, biopsy, Mediastinal - DIFFUSE LARGE B-CELL LYMPHOMA.  Microscopic Comment 2. Minimal involvement by lymphoma is not excluded. 4. LYMPHOMA Histologic type: Non-Hodgkin's lymphoma, diffuse large cell type. Grade (if applicable): High grade (3/3). Flow cytometry: Monoclonal kappa restricted B-cell population ([id number]). Immunohistochemical stains: BCL2, BCL6, CD3, CD5, CD10, CD20, CD30, CD34, CD43, LCA, CD79a, CD138, TdT, CD117, EMA, CD56, kappa and lambda with  appropriate controls. Touch preps/imprints: Not performed. Comments: Sections of specimen #3 and #4 are similar and show soft tissue with a dense and relatively monomorphic infiltrate of large atypical lymphoid appearing cells displaying high nuclear cytoplasmic ratio, vesicular chromatin and prominent single or multiple nucleoli. This is associated with apoptosis, brisk mitosis and a starry sky pattern. Many of the atypical lymphoid cells show immunoblastic features and some cells have plasmacytoid features. The appearance is diffuse with lack of follicular structures. To further evaluate this process, flow cytometric analysis was performed ([id number]), and shows a monoclonal kappa restricted B-cell population expressing limited B-cell antigens including CD22 and CD19 but with the lack of obvious CD20 positivity. Immunohistochemical stains were performed and show that the atypical lymphoid cells are positive for LCA, CD79a, BCL2 and cytoplasmic kappa associated with partial positivity for CD20. CD10 only shows scattering of a few positive cells. CD138 shows patchy, very weak staining although the majority of the cells are negative. No significant staining is seen for CD10, BCL6, CD30, CD34, TdT, CD117, EMA, CD56 or lambda. There is an admixed minor T-cell population in the background  as seen with CD3, CD5 and CD43. There is no apparent co-expression of CD5 in B-cell areas.   A/P: 69 y.o. female   Diffuse Large B Cell Lymphoma Pulmonary nodule Mediastinal mass biopsy on 11/4 demonstrated diagnosis of Diffuse Large B Cell Lymphoma flow cytometry ([id number]), shows a monoclonal kappa restricted B-cell population expressing limited B-cell antigens including CD22 and CD19 but with the lack of obvious CD20 positivity.  Immunohistochemical stains positive for LCA, CD79a, BCL and cytoplasmic kappa associated with partial positivity for CD20. CD10 only shows scattering of a few positive cells. CD138 shows patchy, very weak staining although the majority of the cells are negative. No significant staining is seen for CD10, BCL6, CD30, CD34, TdT, CD117, EMA, CD56 or lambda. There is an admixed minor T-cell population in the background as seen with CD3, CD5 and CD43. There is no apparent co-expression of CD5 in B-cell areas.  Dr. Irene Limbo to see patient later today with further recommendations, to determine if patient is a candidate for treatment once clinically stable   Pericardial Effusion CT chest showed a large mediastinal mass as well as pericardial effusion 2 D echo on 11/3 confirmed a large pericardial effusion ad suspected pericardial mass, tamponade EF was 55-60 She underwent pericardial window on 11/4 Plan as per CTCS  VDRF This is likely due to malignancy as described above She remains intubated For trach today Appreciate CCM follow up  Full Code  Other medical issues as per admitting team.     Rondel Jumbo, PA-C 02/01/2015 12:44 PM  Addendum  The patient was seen with Sherrilyn Rist PA-C.  The above note reflects broadly our joint assessment and findings. I personally examined the patient, and it is not possible since the patient is sedated and on a ventilator, all relevant labs reviewed.  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  Complete staging not done yet. LDH level 950.  Difficulty with bleeding due to airway compression. 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 Plan -I spent 30-40 min. With the patient's family including her daughter  Antionette, patient's husband and son-in-law. -They note that the patient had a fairly decent performance status and was independently ambulatory prior to developing increasing shortness of breath cough and respiratory difficulties over the last several weeks. -We discussed  her diagnosis, prognosis, treatment options.  Family is unequivocally in favor of treating her with chemotherapy as per standard of care. -We discussed the use of R CHOP including possible adverse effects, benefits, alternatives and informed consent was obtained from the patient's daughter and husband. -Patient has had an echo with a normal ejection fraction. -I also had group meeting with the family and the MICU attending to discuss the overall plan of care.  The patient has had an endotracheal tube for about a week.  She'll be getting a tracheostomy tomorrow and then will be transferred to list in the hospital to start her first cycle of R CHOP. -We will continue to follow closely daily. -Will need baseline and daily tumor lysis labs. -CT of the abdomen without contrast to complete some staging since PET/CT scan not possible as an inpatient. -hepatitis profile for Rituxan use -we'll start on allopurinol for tumor lysis syndrome prophylaxis. -Increased dose of  IV steroids instead of the Oral prednisone as part of our R-CHOP. -Will need outpatient PET/CT scan.  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  Family notes that she was followed at [facility] with repeat scans that showed no evidence of metastatic disease or recurrence.  She was last evaluated about a year  ago.  Total time spent 85 min. More than 50% of the time on direct patient contact, family counseling and coordination of care with the critical care team and the oncology pharmacist  Sullivan Lone MD MS

## 2015-02-01 NOTE — Plan of Care (Signed)
Problem: Phase I Progression Outcomes Goal: Tracheostomy by Vent Day 14 To be trached on 02/02/15

## 2015-02-01 NOTE — Progress Notes (Signed)
[facility] BRIEF SICU PROGRESS NOTE  6 Days Post-Op  S/P Procedure(s) (LRB): PERICARDIAL WINDOW (N/A) MEDIASTINOTOMY CHAMBERLAIN MCNEIL (Left)   Stable day  Plan: For tracheostomy in am tomorrow  Rexene Alberts, MD 02/01/2015 4:17 PM

## 2015-02-01 NOTE — Care Management Note (Signed)
Case Management Note  Patient Details  Name: Audrene Metzel MRN: [id number] Date of Birth: [birth year]  Subjective/Objective:      Failing weaning on vent.  Plan for onocolgy consult.               Action/Plan:   Expected Discharge Date:                  Expected Discharge Plan:  Home/Self Care  In-House Referral:     Discharge planning Services  CM Consult  Post Acute Care Choice:    Choice offered to:     DME Arranged:    DME Agency:     HH Arranged:    HH Agency:     Status of Service:  In process, will continue to follow  Medicare Important Message Given:  Yes-second notification given Date Medicare IM Given:    Medicare IM give by:    Date Additional Medicare IM Given:    Additional Medicare Important Message give by:     If discussed at [place] of Stay Meetings, dates discussed:    Additional Comments:  Vergie Living, RN 02/01/2015, 10:35 AM

## 2015-02-01 NOTE — Progress Notes (Signed)
[place] Progress Note Patient Name: Maria Bauer DOB: 01-24-1946 MRN: [id number]   Date of Service  02/01/2015  HPI/Events of Note  Severe agitation and vent dyssynchrony  eICU Interventions  Fentanyl gtt ordered     Intervention Category Major Interventions: Delirium, psychosis, severe agitation - evaluation and management  Merton Border 02/01/2015, 12:56 AM

## 2015-02-02 ENCOUNTER — Inpatient Hospital Stay (HOSPITAL_COMMUNITY): Payer: Medicare (Managed Care)

## 2015-02-02 ENCOUNTER — Encounter (HOSPITAL_COMMUNITY): Payer: Medicare (Managed Care)

## 2015-02-02 ENCOUNTER — Ambulatory Visit (HOSPITAL_COMMUNITY): Payer: No Typology Code available for payment source

## 2015-02-02 DIAGNOSIS — Z5111 Encounter for antineoplastic chemotherapy: Secondary | ICD-10-CM | POA: Insufficient documentation

## 2015-02-02 DIAGNOSIS — J189 Pneumonia, unspecified organism: Secondary | ICD-10-CM

## 2015-02-02 LAB — GLUCOSE, CAPILLARY
GLUCOSE-CAPILLARY: 172 mg/dL — AB (ref 65–99)
GLUCOSE-CAPILLARY: 198 mg/dL — AB (ref 65–99)
GLUCOSE-CAPILLARY: 162 mg/dL — AB (ref 65–99)
Glucose-Capillary: 207 mg/dL — ABNORMAL HIGH (ref 65–99)
Glucose-Capillary: 156 mg/dL — ABNORMAL HIGH (ref 65–99)

## 2015-02-02 LAB — COMPREHENSIVE METABOLIC PANEL
ALT: 28 U/L (ref 14–54)
AST: 34 U/L (ref 15–41)
Albumin: 2.2 g/dL — ABNORMAL LOW (ref 3.5–5.0)
Alkaline Phosphatase: 163 U/L — ABNORMAL HIGH (ref 38–126)
Anion gap: 10 (ref 5–15)
BUN: 40 mg/dL — ABNORMAL HIGH (ref 6–20)
CO2: 30 mmol/L (ref 22–32)
Calcium: 9.9 mg/dL (ref 8.9–10.3)
Chloride: 107 mmol/L (ref 101–111)
Creatinine, Ser: 0.64 mg/dL (ref 0.44–1.00)
GFR calc Af Amer: >60 mL/min (ref >60)
GFR calc non Af Amer: >60 mL/min (ref >60)
Glucose, Bld: 216 mg/dL — ABNORMAL HIGH (ref 65–99)
Potassium: 4.1 mmol/L (ref 3.5–5.1)
Sodium: 147 mmol/L — ABNORMAL HIGH (ref 135–145)
Total Bilirubin: 1.1 mg/dL (ref 0.3–1.2)
Total Protein: 6.2 g/dL — ABNORMAL LOW (ref 6.5–8.1)

## 2015-02-02 LAB — CBC
HCT: 34 % — ABNORMAL LOW (ref 36.0–46.0)
Hemoglobin: 10.5 g/dL — ABNORMAL LOW (ref 12.0–15.0)
MCH: 30 pg (ref 26.0–34.0)
MCHC: 30.9 g/dL (ref 30.0–36.0)
MCV: 97.1 fL (ref 78.0–100.0)
Platelets: 329 10*3/uL (ref 150–400)
RBC: 3.5 MIL/uL — ABNORMAL LOW (ref 3.87–5.11)
RDW: 15.3 % (ref 11.5–15.5)
WBC: 5.3 10*3/uL (ref 4.0–10.5)

## 2015-02-02 LAB — BLOOD GAS, ARTERIAL
Acid-Base Excess: 4.3 mmol/L — ABNORMAL HIGH (ref 0.0–2.0)
Bicarbonate: 28 mEq/L — ABNORMAL HIGH (ref 20.0–24.0)
Drawn by: 437071
FIO2: 0.5
MECHVT: 440 mL
O2 Saturation: 90.5 %
PEEP: 5 cmH2O
Patient temperature: 101.8
RATE: 12 resp/min
TCO2: 29.2 mmol/L (ref 0–100)
pCO2 arterial: 42.5 mmHg (ref 35.0–45.0)
pH, Arterial: 7.442 (ref 7.350–7.450)
pO2, Arterial: 66.9 mmHg — ABNORMAL LOW (ref 80.0–100.0)

## 2015-02-02 LAB — PROTIME-INR
INR: 1.33 (ref 0.00–1.49)
PROTHROMBIN TIME: 16.6 s — AB (ref 11.6–15.2)

## 2015-02-02 LAB — PROCALCITONIN: Procalcitonin: 2.87 ng/mL

## 2015-02-02 LAB — CULTURE, RESPIRATORY W GRAM STAIN

## 2015-02-02 LAB — CULTURE, RESPIRATORY: SPECIAL REQUESTS: NORMAL

## 2015-02-02 LAB — MAGNESIUM: MAGNESIUM: 2.4 mg/dL (ref 1.7–2.4)

## 2015-02-02 LAB — CALCIUM, IONIZED: CALCIUM, IONIZED, SERUM: 6 mg/dL — AB (ref 4.5–5.6)

## 2015-02-02 LAB — HEPATITIS C ANTIBODY

## 2015-02-02 LAB — HEPATITIS B SURFACE ANTIBODY,QUALITATIVE: HEP B S AB: NONREACTIVE

## 2015-02-02 LAB — HEPATITIS B SURFACE ANTIGEN: HEP B S AG: NEGATIVE

## 2015-02-02 LAB — PHOSPHORUS: PHOSPHORUS: 3 mg/dL (ref 2.5–4.6)

## 2015-02-02 MED ORDER — HEPARIN SOD (PORK) LOCK FLUSH 100 UNIT/ML IV SOLN
500.0000 [IU] | Freq: Once | INTRAVENOUS | Status: DC | PRN
  Filled 2015-02-02: qty 5

## 2015-02-02 MED ORDER — DEXMEDETOMIDINE HCL IN NACL 400 MCG/100ML IV SOLN
0.0000 ug/kg/h | INTRAVENOUS | Status: AC
  Administered 2015-02-02: 1 ug/kg/h via INTRAVENOUS
  Administered 2015-02-02: 0.4 ug/kg/h via INTRAVENOUS
  Administered 2015-02-03: 0.9 ug/kg/h via INTRAVENOUS
  Administered 2015-02-03: 0.5 ug/kg/h via INTRAVENOUS
  Administered 2015-02-03: 1 ug/kg/h via INTRAVENOUS
  Administered 2015-02-04: 0.4 ug/kg/h via INTRAVENOUS
  Administered 2015-02-04 (×2): 0.9 ug/kg/h via INTRAVENOUS
  Administered 2015-02-05: 0.4 ug/kg/h via INTRAVENOUS
  Administered 2015-02-05: 0.9 ug/kg/h via INTRAVENOUS
  Filled 2015-02-02 (×11): qty 100

## 2015-02-02 MED ORDER — FREE WATER
250.0000 mL | Freq: Four times a day (QID) | Status: DC
  Administered 2015-02-02 – 2015-02-14 (×47): 250 mL

## 2015-02-02 MED ORDER — FUROSEMIDE 10 MG/ML IJ SOLN
40.0000 mg | Freq: Three times a day (TID) | INTRAMUSCULAR | Status: AC
  Administered 2015-02-02 (×2): 40 mg via INTRAVENOUS
  Filled 2015-02-02 (×2): qty 4

## 2015-02-02 MED ORDER — ALTEPLASE 2 MG IJ SOLR
2.0000 mg | Freq: Once | INTRAMUSCULAR | Status: DC | PRN
Start: 2015-02-02 — End: 2015-02-05
  Filled 2015-02-02: qty 2

## 2015-02-02 MED ORDER — COLD PACK MISC ONCOLOGY
1.0000 | Freq: Once | Status: AC | PRN
  Filled 2015-02-02: qty 1

## 2015-02-02 MED ORDER — DOXORUBICIN HCL CHEMO IV INJECTION 2 MG/ML
50.0000 mg/m2 | Freq: Once | INTRAVENOUS | Status: AC
  Administered 2015-02-02: 104 mg via INTRAVENOUS
  Filled 2015-02-02: qty 52

## 2015-02-02 MED ORDER — POTASSIUM CHLORIDE CRYS ER 20 MEQ PO TBCR
40.0000 meq | EXTENDED_RELEASE_TABLET | Freq: Once | ORAL | Status: AC
  Administered 2015-02-02: 40 meq via ORAL
  Filled 2015-02-02: qty 2

## 2015-02-02 MED ORDER — SODIUM CHLORIDE 0.9 % IV SOLN
Freq: Once | INTRAVENOUS | Status: AC
  Administered 2015-02-02: 16:00:00 via INTRAVENOUS
  Filled 2015-02-02 (×2): qty 8

## 2015-02-02 MED ORDER — SODIUM CHLORIDE 0.9 % IJ SOLN: 10.0000 mL | INTRAMUSCULAR | Status: DC | PRN

## 2015-02-02 MED ORDER — VINCRISTINE SULFATE CHEMO INJECTION 1 MG/ML
2.0000 mg | Freq: Once | INTRAVENOUS | Status: AC
  Administered 2015-02-02: 2 mg via INTRAVENOUS
  Filled 2015-02-02: qty 2

## 2015-02-02 MED ORDER — HOT PACK MISC ONCOLOGY
1.0000 | Freq: Once | Status: AC | PRN
  Filled 2015-02-02: qty 1

## 2015-02-02 MED ORDER — SODIUM CHLORIDE 0.9 % IJ SOLN
3.0000 mL | INTRAMUSCULAR | Status: DC | PRN
  Filled 2015-02-02: qty 3

## 2015-02-02 MED ORDER — SODIUM CHLORIDE 0.9 % IV SOLN
750.0000 mg/m2 | Freq: Once | INTRAVENOUS | Status: AC
  Administered 2015-02-02: 1540 mg via INTRAVENOUS
  Filled 2015-02-02: qty 77

## 2015-02-02 MED ORDER — HEPARIN SOD (PORK) LOCK FLUSH 100 UNIT/ML IV SOLN
250.0000 [IU] | Freq: Once | INTRAVENOUS | Status: DC | PRN
  Filled 2015-02-02: qty 3

## 2015-02-02 MED ORDER — SODIUM CHLORIDE 0.9 % IV SOLN: Freq: Once | INTRAVENOUS | Status: DC

## 2015-02-02 NOTE — Progress Notes (Signed)
Increased FIO2 to 100 for trach procedure

## 2015-02-02 NOTE — Progress Notes (Signed)
Marland Kitchen   HEMATOLOGY/ONCOLOGY INPATIENT PROGRESS NOTE  Date of Service: 02/02/2015  Inpatient Attending: .Ivin Poot, MD   SUBJECTIVE  Maria Bauer had a tracheostomy this morning. Was transferred to [facility] from [facility] to received R-CHOP for her new diagnosis of mediastinal diffuse large B-cell lymphoma. Had CT abd last evening which showed no abdominal LNadenopathy or overt liver/spleen involvement. Patient nearly done with CHOP today and tolerating treatment well thus far with no acute concerns. Shall received Rituxan tomorrow to allow for close monitoring with adequate staff presence.   OBJECTIVE:  PHYSICAL EXAMINATION: . Filed Vitals:   02/02/15 1200 02/02/15 1300 02/02/15 1400 02/02/15 1520  BP: 148/70 158/76 152/75 113/64  Pulse: 70 69 68 63  Temp:    98.3 F (36.8 C)  TempSrc:    Axillary  Resp: 20 20 20 20   Height:      Weight:      SpO2: 97% 99% 100% 96%   Filed Weights   01/31/15 0400 02/01/15 0500 02/02/15 0500  Weight: 205 lb 0.4 oz (93 kg) 206 lb 5.6 oz (93.6 kg) 209 lb 14.1 oz (95.2 kg)   .Body mass index is 36.01 kg/(m^2).   GENERAL intubated and sedated SKIN: skin color, texture, turgor are normal, no rashes or significant lesions EYES: normal, conjunctiva are pink and non-injected, sclera clear OROPHARYNX:no exudate, no erythema and lips, buccal mucosa NECK: supple, thyroid normal size, non-tender, without nodularity LYMPH: no palpable lymphadenopathy in the cervical, axillary or inguinal area LUNGS: Distant breath , decreased air entry LL>>Rt HEART: regular rate & rhythm and no murmurs and trace pedal edema ABDOMEN:soft, non-tender and normal bowel sounds, no hepatosplenomegaly. NEURO:sedated and ventilated   MEDICAL HISTORY:  Past Medical History  Diagnosis Date  . High cholesterol   . Hypertension   . Acid reflux   . Insomnia   . Scoliosis   . Asthma     "stopped in the 1990's"  . Anemia   . Peptic ulcer   . Daily headache   . Rheumatoid  arthritis ([place])     "everywhere" (02/21/2015)  . Chronic lower back pain     "mostly on the right" (02/17/2015)  . Anxiety   . Melanoma ([place]) 11/2011    "left forearm"    SURGICAL HISTORY: Past Surgical History  Procedure Laterality Date  . Appendectomy    . Total knee arthroplasty Bilateral   . Joint replacement    . Total abdominal hysterectomy    . Melanoma excision Left 11/2011    "forearm"  . Lymph node biopsy      "around the back of neck"  . Axillary lymph node biopsy Left   . Inguinal lymph node biopsy Left   . Pericardial window N/A 02/15/2015    Procedure: PERICARDIAL WINDOW;  Surgeon: Ivin Poot, MD;  Location: [place];  Service: Thoracic;  Laterality: N/A;  . Mediastinotomy chamberlain mcneil Left 01/30/2015    Procedure: MEDIASTINOTOMY CHAMBERLAIN MCNEIL;  Surgeon: Ivin Poot, MD;  Location: [facility] OR;  Service: Thoracic;  Laterality: Left;    SOCIAL HISTORY: Social History   Social History  . Marital Status: Married    Spouse Name: N/A  . Number of Children: N/A  . Years of Education: N/A   Occupational History  . Not on file.   Social History Main Topics  . Smoking status: Never Smoker   . Smokeless tobacco: Never Used  . Alcohol Use: No  . Drug Use: No  . Sexual Activity: Not on  file   Other Topics Concern  . Not on file   Social History Narrative    FAMILY HISTORY: Family History  Problem Relation Age of Onset  . Cancer Sister 58    BREAST    ALLERGIES:  is allergic to morphine and related and norco.  MEDICATIONS:  Scheduled Meds: . sodium chloride   Intravenous Once  . allopurinol  150 mg Oral BID  . antiseptic oral rinse  7 mL Mouth Rinse QID  . bisacodyl  10 mg Oral Daily  . cefTAZidime (FORTAZ)  IV  1 g Intravenous 3 times per day  . chlorhexidine gluconate  15 mL Mouth Rinse BID  . famotidine (PEPCID) IV  20 mg Intravenous Q12H  . feeding supplement (PRO-STAT SUGAR FREE 64)  60 mL Per Tube TID  . feeding supplement (VITAL  HIGH PROTEIN)  1,000 mL Per Tube Q24H  . free water  250 mL Per Tube Q6H  . furosemide  40 mg Intravenous Q8H  . insulin aspart  0-20 Units Subcutaneous 6 times per day  . insulin detemir  18 Units Subcutaneous Daily  . methylPREDNISolone (SOLU-MEDROL) injection  125 mg Intravenous Q12H  . metoCLOPramide (REGLAN) injection  10 mg Intravenous 4 times per day  . propofol  5-80 mcg/kg/min Intravenous Once  . senna-docusate  1 tablet Oral QHS   Continuous Infusions: . dexmedetomidine 0.8 mcg/kg/hr (02/02/15 1632)  . dextrose Stopped (02/02/15 1622)  . fentaNYL infusion INTRAVENOUS 10 mcg/hr (02/02/15 1600)  . phenylephrine (NEO-SYNEPHRINE) Adult infusion Stopped (01/31/15 0110)   PRN Meds:.alteplase, Cold Pack, fentaNYL (SUBLIMAZE) injection, fentaNYL (SUBLIMAZE) injection, heparin lock flush, heparin lock flush, Hot Pack, ondansetron (ZOFRAN) IV, potassium chloride, sodium chloride, sodium chloride  REVIEW OF SYSTEMS:    10 Point review of Systems was done is negative except as noted above.   LABORATORY DATA:  I have reviewed the data as listed  . CBC Latest Ref Rng 02/02/2015 02/01/2015 01/31/2015  WBC 4.0 - 10.5 K/uL 5.3 6.6 6.9  Hemoglobin 12.0 - 15.0 g/dL 10.5(L) 10.5(L) 11.5(L)  Hematocrit 36.0 - 46.0 % 34.0(L) 33.8(L) 36.6  Platelets 150 - 400 K/uL 329 273 251    . CMP Latest Ref Rng 02/02/2015 02/01/2015 01/31/2015  Glucose 65 - 99 mg/dL 216(H) 173(H) 198(H)  BUN 6 - 20 mg/dL 40(H) 36(H) 30(H)  Creatinine 0.44 - 1.00 mg/dL 0.64 0.55 0.52  Sodium 135 - 145 mmol/L 147(H) 148(H) 145  Potassium 3.5 - 5.1 mmol/L 4.1 3.7 3.6  Chloride 101 - 111 mmol/L 107 112(H) 111  CO2 22 - 32 mmol/L 30 29 25   Calcium 8.9 - 10.3 mg/dL 9.9 9.5 9.1  Total Protein 6.5 - 8.1 g/dL 6.2(L) 5.3(L) 5.3(L)  Total Bilirubin 0.3 - 1.2 mg/dL 1.1 0.8 1.2  Alkaline Phos 38 - 126 U/L 163(H) 81 75  AST 15 - 41 U/L 34 22 26  ALT 14 - 54 U/L 28 20 19      RADIOGRAPHIC STUDIES: I have personally  reviewed the radiological images as listed and agreed with the findings in the report. Ct Abdomen Pelvis Wo Contrast  02/01/2015  CLINICAL DATA:  Diffuse large B-cell lymphoma undergoing chemotherapy. EXAM: CT ABDOMEN AND PELVIS WITHOUT CONTRAST TECHNIQUE: Multidetector CT imaging of the abdomen and pelvis was performed following the standard protocol without IV contrast. COMPARISON:  Recent chest CT 01/19/2015 FINDINGS: Examination is degraded by motion. Lower chest: There is air in the left chest wall likely from recent biopsy/surgery. The left-sided chest tube is in place.  Extensive left lower lobe airspace consolidation which could reflect atelectasis or pneumonia. Small bilateral pleural effusions. Right basilar atelectasis. There is an NG tube coursing down the esophagus and into the stomach. Bulky mediastinal lymphadenopathy again demonstrated. Coronary artery calcifications are noted. Hepatobiliary: No focal hepatic lesions except for a small cyst in the right hepatic lobe inferiorly. No evidence of metastatic disease without IV contrast. The gallbladder is grossly normal. No common bile duct dilatation. Pancreas: No mass, inflammation or ductal dilatation. Spleen: Normal size.  No definite lesions. Adrenals/Urinary Tract: The adrenal glands are normal. Both kidneys are normal except for a right renal calculus. No hydronephrosis. Stomach/Bowel: The stomach contains an NG tube. The duodenum, small bowel and colon are unremarkable. No inflammatory changes, mass lesions or obstructive findings. A rectal catheter is in place. Vascular/Lymphatic: No mesenteric or retroperitoneal mass or adenopathy. Small scattered lymph nodes are noted. There are moderate to advanced aortic calcifications but no focal aneurysm. Other: No pelvic mass or adenopathy. No free pelvic fluid collections. A Foley catheter is noted in the bladder. The uterus is surgically absent. No inguinal mass or adenopathy. Musculoskeletal: No  significant bony findings. Degenerative changes involving the spine with severe facet disease. There is a remote appearing compression fracture of L1. IMPRESSION: 1. Left-sided chest tube in place with a small left pleural effusion. Significant left lower lobe airspace consolidation suggesting pneumonia or dense atelectasis. 2. Bulky mediastinal lymphadenopathy. 3. No adenopathy in the abdomen or pelvis. 4. No acute abdominal/pelvic findings. Electronically Signed   By: Marijo Sanes M.D.   On: 02/01/2015 21:33   Dg Chest 2 View  01/17/2015  CLINICAL DATA:  Cough and shortness of breath for the past several days EXAM: CHEST  2 VIEW COMPARISON:  None in PACs FINDINGS: There is an abnormal soft tissue mass arising from the left hilar and suprahilar regions adjacent to the AP window. The aortic knob remains reasonably well demonstrated. The cardiac silhouette is enlarged. The pulmonary vascularity is not engorged. The lungs are well-expanded. There is no focal infiltrate nor significant pleural effusion. There is moderate dextrocurvature of the thoracic spine centered at approximately T8. IMPRESSION: Large abnormal anterior mediastinal mass. This may be neoplastic or vascular in nature. CT scanning of the chest now is recommended. These results will be called to the ordering clinician or representative by the Radiologist Assistant, and communication documented in the PACS or zVision Dashboard. Electronically Signed   By: David  Martinique M.D.   On: 01/17/2015 11:15   Ct Chest W Contrast  01/19/2015  CLINICAL DATA:  Cough, shortness of breath, possible chest mass. EXAM: CT CHEST WITH CONTRAST TECHNIQUE: Multidetector CT imaging of the chest was performed during intravenous contrast administration. CONTRAST:  19mL ISOVUE-300 IOPAMIDOL (ISOVUE-300) INJECTION 61% COMPARISON:  January 17, 2015. FINDINGS: No pneumothorax or pleural effusion is noted. 5 mm nodule is noted posteriorly in the right lower lobe best seen on  image number 27 of series 4. Atherosclerosis of thoracic aorta is noted without aneurysm or dissection. Great vessels are widely patent. Mild coronary artery calcifications are noted. Moderate pericardial effusion is noted. Visualized portion of upper abdomen is unremarkable. Abnormal soft tissue density is noted in the anterior mediastinum that measures 12.0 x 8.8 cm. This is noted to compress a portion of the left innominate vein. This is most consistent with adenopathy consistent with lymphoma or metastatic disease. No significant osseous abnormality is noted. IMPRESSION: 12.0 x 8.8 cm abnormal soft tissue density seen in anterior mediastinum concerning for  adenopathy related to lymphoma or other metastatic disease. Some compression of the left innominate vein is noted as a result. Moderate pericardial effusion is noted as well. 5 mm nodule is noted posteriorly in the right lower lobe. This may represent metastatic focus given above finding. Alternatively, it may be unrelated. Continued CT follow-up is recommended to ensure stability. These results will be called to the ordering clinician or representative by the Radiologist Assistant, and communication documented in the PACS or zVision Dashboard. Electronically Signed   By: Marijo Conception, M.D.   On: 01/19/2015 12:07   Dg Chest Port 1 View  02/02/2015  CLINICAL DATA:  Tracheostomy tube placement. EXAM: PORTABLE CHEST 1 VIEW COMPARISON:  Earlier film 02/02/2015. FINDINGS: The tracheostomy tube is in good position with the tip at the mid tracheal level. The right IJ catheter is stable. Stable mediastinal adenopathy/mass and persistent left lower lobe infiltrate/atelectasis/effusion. IMPRESSION: Tracheostomy tube in good position without complicating features. Electronically Signed   By: Marijo Sanes M.D.   On: 02/02/2015 12:18   Dg Chest Port 1 View  02/02/2015  CLINICAL DATA:  Shortness of breath. EXAM: PORTABLE CHEST 1 VIEW COMPARISON:  02/01/2015  FINDINGS: Examination is partially limited by leftward patient rotation. Endotracheal tube terminates above the level of the thoracic inlet and has likely been slightly retracted in the interim. Enteric tube courses towards the left upper abdomen with tip not imaged. Right jugular central venous catheter remains, projecting over the SVC. Known, large mediastinal mass is again seen. Cardiac silhouette remains enlarged. Left chest tube remains in place. No pneumothorax is identified. Left basilar opacities have slightly improved. Mild right basilar opacity is unchanged may reflect atelectasis. IMPRESSION: 1. Mild interval retraction of the endotracheal tube which projects above the thoracic inlet. Consider advancement. 2. Minimally improved aeration of the left lung base. No pneumothorax. Electronically Signed   By: Logan Bores M.D.   On: 02/02/2015 07:18   Dg Chest Port 1 View  02/01/2015  CLINICAL DATA:  Pericardial window.  Pericardial effusion. EXAM: PORTABLE CHEST 1 VIEW COMPARISON:  01/31/2015 FINDINGS: Endotracheal tube in good position. NG tube in place with the tip not visualized due to underpenetration. Right jugular central venous catheter tip in the SVC. No pneumothorax on the right Interval improvement in aeration of the left lung with decrease in density left lower lobe. This may be due to decreased diffusion atelectasis. Left chest tube remains in place. No pneumothorax on the left. Cardiac enlargement has improved. Right lower lobe atelectasis is present. IMPRESSION: Endotracheal tube in good position. Decreased left lower lobe density with improved aeration of the left lung. Left chest tube in place without pneumothorax. Electronically Signed   By: Franchot Gallo M.D.   On: 02/01/2015 07:16   Dg Chest Port 1 View  01/31/2015  CLINICAL DATA:  Status post median sternotomy and pericardial window creation for pleural effusion, acute respiratory failure, cardiac tamponade, mediastinal mass. EXAM:  PORTABLE CHEST 1 VIEW COMPARISON:  Portable chest x-ray of January 27, 2015 FINDINGS: The trachea has apparently been extubated. There is linear radiodensity that projects over the lower neck which could be a portion of the endotracheal tube but if so it is positioned quite high. The right lung remains well-expanded and clear. On the left only a small amount of aerated lung persists in the apex and laterally. The left chest tube is stable. The large mediastinal mass is unchanged. There is no pneumothorax nor definite pleural effusion. The esophagogastric tube tip projects  below the inferior margin of the image. The right internal jugular venous catheter tip projects over the midportion of the SVC. IMPRESSION: Stable appearance of the chest following extubation of the trachea. Persistent large mediastinal mass. There is no pneumothorax. Electronically Signed   By: David  Martinique M.D.   On: 01/31/2015 07:33   Dg Chest Port 1 View  01/30/2015  CLINICAL DATA:  Persistent fever. Status post pericardial window and mediastinal biopsy. EXAM: PORTABLE CHEST 1 VIEW COMPARISON:  Multiple chest x-rays and chest CT from 01/19/2015 FINDINGS: The need support apparatus is stable. The endotracheal tube is 4.3 cm above the carina. The NG tube is stable. The left-sided chest tube is stable. Persistent large mediastinal mass occupying good portion of the left hemi thorax. Persistent vascular congestion and bibasilar atelectasis. IMPRESSION: Stable support apparatus. Stable left-sided chest tube.  No definite pneumothorax. Stable large mass occupying a good portion of the left hemi thorax. Electronically Signed   By: Marijo Sanes M.D.   On: 01/30/2015 23:30   Dg Chest Port 1 View  01/30/2015  CLINICAL DATA:  Intubated patient, acute respiratory failure and hypoxia, history of mediastinal mass, pericardial effusion and cardiac tamponade EXAM: PORTABLE CHEST 1 VIEW COMPARISON:  Portable chest x-ray of January 29, 2015. FINDINGS:  Stable volume loss on the left with large mediastinal mass. The left-sided chest tubes are unchanged in position. There is no pneumothorax nor large pleural effusion. The right lung is well-expanded. Mild interstitial prominence throughout the right lung is stable. The pulmonary vascularity is not engorged. The endotracheal tube tip lies 3.8 cm above the carina. The right internal jugular venous catheter tip projects over the midportion of the SVC. The esophagogastric tube tip projects below the inferior margin of the image. IMPRESSION: There has been no significant change in the appearance of the chest since yesterday's study. The support tubes are in reasonable position. Electronically Signed   By: David  Martinique M.D.   On: 01/30/2015 07:21   Dg Chest Port 1 View  01/29/2015  CLINICAL DATA:  Shortness of breath, acute respiratory failure, intubated patient, history of mediastinal mass, pericardial effusion, and cardiac tamponade. EXAM: PORTABLE CHEST 1 VIEW COMPARISON:  Portable chest x-ray of January 28, 2015 FINDINGS: The right lung is well-expanded. There is a trace of pleural fluid at the right lung base. On the left the cardiac silhouette occupies much of the hemi thorax. A small amount of aerated lung in the apex and inferior laterally is still demonstrated. The endotracheal tube tip projects approximately 3.7 cm above the carina. The esophagogastric tube tip projects below the inferior margin of the image. A left-sided chest tube has its tip located over the lateral aspect of the approximately 6 rib and is stable. A second tube versus the tip of the esophagogastric tube is noted medially at the left lung base. IMPRESSION: Persistent large mediastinal mass on the left. Slight interval worsening in volume loss on the left. The cardiac silhouette remains enlarged. No pneumothorax nor large left pleural effusion is observed. The right lung is largely clear. Electronically Signed   By: David  Martinique M.D.   On:  01/29/2015 07:25   Dg Chest Port 1 View  01/28/2015  CLINICAL DATA:  ET tube placement EXAM: PORTABLE CHEST 1 VIEW COMPARISON:  01/27/15 FINDINGS: The ET tube tip is stable above the carina. There is a right IJ catheter with tip in the projection of the SVC. Left-sided chest tube is identified. No pneumothorax. Stable cardiac enlargement and stable  large anterior mediastinal mass. No change in small bilateral pleural effusions and pulmonary edema consistent with CHF. IMPRESSION: 1. Left chest tube in place without visible pneumothorax. 2. Large anterior mediastinal mass 3. Bilateral pleural effusions and pulmonary edema. Electronically Signed   By: Kerby Moors M.D.   On: 01/28/2015 08:28   Dg Chest Port 1 View  01/27/2015  CLINICAL DATA:  Pericardial effusion EXAM: PORTABLE CHEST 1 VIEW COMPARISON:  02/08/2015 FINDINGS: ET tube tip is stable above the carina. There is a right IJ catheter with tip in the projection of the SVC. Left chest tube is in place. No pneumothorax. NG tube tip is in the stomach. Cardiac enlargement and large anterior mediastinal mass is again noted. Unchanged from previous exam. Increase in pleural fluid noted bilaterally. IMPRESSION: 1. Stable support apparatus. 2. Left chest tube without pneumothorax. 3. Increase in bilateral pleural effusions. Electronically Signed   By: Kerby Moors M.D.   On: 01/27/2015 09:00   Dg Chest Portable 1 View  01/30/2015  CLINICAL DATA:  Status post pericardial window and Chamberlain procedure for large mediastinal mass with associated pericardial effusion. EXAM: PORTABLE CHEST 1 VIEW COMPARISON:  CT of the chest on 01/19/2015 FINDINGS: Endotracheal tube present with the tip approximately 2.5 cm above the carina. Right jugular central line has been placed with the catheter tip in the SVC. Pericardial drain and left-sided chest tube present. Nasogastric tube extends into the distal esophagus and does not extend below the diaphragm. No pneumothorax  identified. Lung volumes are very low bilaterally. Huge mediastinal mass again visualized which is more prominent to the left of midline. IMPRESSION: 1. No evidence of pneumothorax postoperatively. 2. Low lung volumes. 3. Nasogastric tube tip lies in the distal esophagus. Electronically Signed   By: Aletta Edouard M.D.   On: 02/04/2015 14:30    ASSESSMENT & PLAN:    #1 Diffuse large B-cell lymphoma -immunoblastic phenotype with large mediastinal mass causing airway and some venous compression.No overt evidence of LNadenopathy in the neck, axilla and no abd/retroperitoneal LNadenopathy on CT abd/pelvis. LDH level 950. 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 Cytogenetics and FISH studies pending. Blood counts stable. Difficulty with bleeding due to airway compression. 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 Plan -patient had a tracheostomy today due to delayed weaning. -receiving first cycle of CHOP today and Rituxan tomorrow. -Will need baseline and daily tumor lysis labs. -continue Allopurinol for TLS prophylaxis -IV solumedrol continue at current dose for 5 days (instead of PO prednisone as part of CHOP) -Will need outpatient PET/CT scan. -if evidence of TLS at this time. -adequate IV hydration to maintain urine out of 2L per 24hours. Might need alkalinization if significant TLS -? Role for endo-bronchial stenting to open up atelectatic lung segment caused by extrinsic compression. - will defer this decision to pulmonary.  #2 ?HCAP/ventilator acquired pneumonia - elevated procalcitonin --- improving -continue antibiotics and vent management as per critical care team.  #3 Previous history of upper extremity melanoma status post resection couple of years ago. Family notes that she was followed at [facility] with repeat scans that showed no evidence of metastatic disease or recurrence. She was last  evaluated about a year ago.  Oncology will continue to follow daily.    Sullivan Lone MD [place] AAHIVMS [facility] Hematology/Oncology Physician [facility]  (Office):       [phone] (Work cell):  [phone] (Fax):  [phone]  02/02/2015 5:08 PM

## 2015-02-02 NOTE — Procedures (Signed)
Bedside Tracheostomy Insertion Procedure Note   Patient Details:   Name: Maria Bauer DOB: [birth year] MRN: [id number]  Procedure: Tracheostomy  Pre Procedure Assessment: ET Tube Size:7.5 ET Tube secured at lip (cm):19 Bite block in place: No Breath Sounds: Rhonch  Post Procedure Assessment: BP 133/65 mmHg  Pulse 72  Temp(Src) 100.5 F (38.1 C) (Oral)  Resp 20  Ht 5\' 4"  (1.626 m)  Wt 209 lb 14.1 oz (95.2 kg)  BMI 36.01 kg/m2  SpO2 97% O2 sats: stable throughout Complications: No apparent complications Patient did tolerate procedure well Tracheostomy Brand:Shiley Tracheostomy Style:Cuffed Tracheostomy Size: 6.0 Tracheostomy Secured [id number], velcro Tracheostomy Placement Confirmation:Trach cuff visualized and in place, cxr taken for placement    Erisha Paugh Ann 02/02/2015, 12:56 PM

## 2015-02-02 NOTE — Progress Notes (Signed)
Name: Mehek Zody MRN: [id number] DOB: [birth year]    ADMISSION DATE:  01/24/2015  CONSULTATION DATE:  02/19/2015  REFERRING MD :  Dr. Harrington Challenger  CHIEF COMPLAINT:  Pericardial effusion  BRIEF PATIENT DESCRIPTION: 69 year old female with recent diagnosis of mediastinal mass, was seen by cardiothoracic surgery in the outpatient setting who referred her for an echocardiogram 11/3. Echocardiogram findings included large pericardial effusion. Due to persistent cough she is considered high risk for pericardiocentesis and requires admission for cough suppression prior to procedure. PCCM asked to see in consultation.  SIGNIFICANT EVENTS  10/27 evaluated in ED for cough. Prior CXR from outpatient noted with possible chest mass. 11/2 seen by Dr. Prescott Gum as outpatient regarding CT findings, appears to be non-resectable mass 11/3 presented for echo, large effusion found, admitted for drainage.  11/4 to OR for pericardial window and mediastinal mass biopsy. 11/6 Remains intubated due to hypoxemia. 11/7 Remains intubated, very agitated with WUA, unable to wean. Still on low dose Neo 11/8 febrile overnight, cultures sent. Thick ETT secretions. Antibiotics started by TCTS. 11/11 Trach (JY) and transfer to [facility] for chemotherapy (orders by H/O are already in place).  STUDIES:  CT chest (10/28) > 12.0 x 8.8 cm abnormal soft tissue density seen in anterior mediastinum concerning for adenopathy related to lymphoma or other metastatic disease. Some compression of the left innominate vein is noted as a result. 5 mm nodule is noted posteriorly in the right lower lobe. This may represent metastatic focus given above finding. Alternatively, it may be unrelated. Continued CT follow-up is recommended to ensure stability. Moderate pericardial effusion is noted as well.  11/10 path consistent with B-cell lymphoma.  SUBJECTIVE:  Very agitated on wake up assessment, no events overnight.  VITAL SIGNS: Temp:  [96 F  (35.6 C)-101.8 F (38.8 C)] 96 F (35.6 C) (11/11 0700) Pulse Rate:  [70-108] 72 (11/11 1100) Resp:  [14-31] 20 (11/11 1100) BP: (90-138)/(46-89) 133/65 mmHg (11/11 1100) SpO2:  [92 %-98 %] 97 % (11/11 1100) FiO2 (%):  [40 %-100 %] 100 % (11/11 1000) Weight:  [95.2 kg (209 lb 14.1 oz)] 95.2 kg (209 lb 14.1 oz) (11/11 0500)  PHYSICAL EXAMINATION:  General:  Sedated on vent, not following command. HENT: ETT tube in place, [place]/AT, PERRL. PULM: Clear R, diminished/near absent on L. CV: RRR, distant, extremities well perfused. GI: obese, BS+, soft, non-distended. MSK: normal bulk, no bony deformities. Neuro: sedated on vent, withdraws all ext to command.  Recent Labs Lab 01/31/15 0413 02/01/15 0500 02/02/15 0451  NA 145 148* 147*  K 3.6 3.7 4.1  CL 111 112* 107  CO2 25 29 30   BUN 30* 36* 40*  CREATININE 0.52 0.55 0.64  GLUCOSE 198* 173* 216*    Recent Labs Lab 01/31/15 0413 02/01/15 0500 02/02/15 0451  HGB 11.5* 10.5* 10.5*  HCT 36.6 33.8* 34.0*  WBC 6.9 6.6 5.3  PLT 251 273 329   Ct Abdomen Pelvis Wo Contrast  02/01/2015  CLINICAL DATA:  Diffuse large B-cell lymphoma undergoing chemotherapy. EXAM: CT ABDOMEN AND PELVIS WITHOUT CONTRAST TECHNIQUE: Multidetector CT imaging of the abdomen and pelvis was performed following the standard protocol without IV contrast. COMPARISON:  Recent chest CT 01/19/2015 FINDINGS: Examination is degraded by motion. Lower chest: There is air in the left chest wall likely from recent biopsy/surgery. The left-sided chest tube is in place. Extensive left lower lobe airspace consolidation which could reflect atelectasis or pneumonia. Small bilateral pleural effusions. Right basilar atelectasis. There is an NG tube  coursing down the esophagus and into the stomach. Bulky mediastinal lymphadenopathy again demonstrated. Coronary artery calcifications are noted. Hepatobiliary: No focal hepatic lesions except for a small cyst in the right hepatic lobe  inferiorly. No evidence of metastatic disease without IV contrast. The gallbladder is grossly normal. No common bile duct dilatation. Pancreas: No mass, inflammation or ductal dilatation. Spleen: Normal size.  No definite lesions. Adrenals/Urinary Tract: The adrenal glands are normal. Both kidneys are normal except for a right renal calculus. No hydronephrosis. Stomach/Bowel: The stomach contains an NG tube. The duodenum, small bowel and colon are unremarkable. No inflammatory changes, mass lesions or obstructive findings. A rectal catheter is in place. Vascular/Lymphatic: No mesenteric or retroperitoneal mass or adenopathy. Small scattered lymph nodes are noted. There are moderate to advanced aortic calcifications but no focal aneurysm. Other: No pelvic mass or adenopathy. No free pelvic fluid collections. A Foley catheter is noted in the bladder. The uterus is surgically absent. No inguinal mass or adenopathy. Musculoskeletal: No significant bony findings. Degenerative changes involving the spine with severe facet disease. There is a remote appearing compression fracture of L1. IMPRESSION: 1. Left-sided chest tube in place with a small left pleural effusion. Significant left lower lobe airspace consolidation suggesting pneumonia or dense atelectasis. 2. Bulky mediastinal lymphadenopathy. 3. No adenopathy in the abdomen or pelvis. 4. No acute abdominal/pelvic findings. Electronically Signed   By: Marijo Sanes M.D.   On: 02/01/2015 21:33   Dg Chest Port 1 View  02/02/2015  CLINICAL DATA:  Shortness of breath. EXAM: PORTABLE CHEST 1 VIEW COMPARISON:  02/01/2015 FINDINGS: Examination is partially limited by leftward patient rotation. Endotracheal tube terminates above the level of the thoracic inlet and has likely been slightly retracted in the interim. Enteric tube courses towards the left upper abdomen with tip not imaged. Right jugular central venous catheter remains, projecting over the SVC. Known, large  mediastinal mass is again seen. Cardiac silhouette remains enlarged. Left chest tube remains in place. No pneumothorax is identified. Left basilar opacities have slightly improved. Mild right basilar opacity is unchanged may reflect atelectasis. IMPRESSION: 1. Mild interval retraction of the endotracheal tube which projects above the thoracic inlet. Consider advancement. 2. Minimally improved aeration of the left lung base. No pneumothorax. Electronically Signed   By: Logan Bores M.D.   On: 02/02/2015 07:18   Dg Chest Port 1 View  02/01/2015  CLINICAL DATA:  Pericardial window.  Pericardial effusion. EXAM: PORTABLE CHEST 1 VIEW COMPARISON:  01/31/2015 FINDINGS: Endotracheal tube in good position. NG tube in place with the tip not visualized due to underpenetration. Right jugular central venous catheter tip in the SVC. No pneumothorax on the right Interval improvement in aeration of the left lung with decrease in density left lower lobe. This may be due to decreased diffusion atelectasis. Left chest tube remains in place. No pneumothorax on the left. Cardiac enlargement has improved. Right lower lobe atelectasis is present. IMPRESSION: Endotracheal tube in good position. Decreased left lower lobe density with improved aeration of the left lung. Left chest tube in place without pneumothorax. Electronically Signed   By: Franchot Gallo M.D.   On: 02/01/2015 07:16     ASSESSMENT / PLAN:  PULMONARY A: Ventilator dependent respiratory failure Acute hypoxemic respiratory failure. Large mediastinal mass - concern for lymphoma Pulmonary nodule - 53mm RLL Remains intubated post operatively.  Plan: Adjust PEEP/FIO2 to maintain O2 saturation > 90%. Reduce sedation as able. Tracheostomy today. Chest tube management per thoracic surgery. Transfer to [facility]  for chemotherapy.  CARDIOVASCULAR: Mediastinal CT >>>out Pleural CT >>> Large pericardial effusion with mediastinal mass, now s/p pericardial window  and mediastinal mass biopsy Tamponade> likely due to malignant pericardial effusion now s/p pericardial window Hypotension > requiring low dose phenylephrine, possibly hypovolemia vs medical sedation related.  Plan: Follow up path B-cell lymphoma. Hold anti-HTN. Managment per CVTS. Diureses as below.  RENAL A: Hypernatremia with diureses. P: Monitor BMET and UOP Replace electrolytes as needed Continue diuresis with Lasix 40 mg IV q8 x2 doses. Free water 250 q6. D5W at 30 ml/hr per CVTS.  GI:  A: no acute issues P:  OG tube Pepcid for stress ulcer prophylaxis TF  ID:  A: Continued fever Copious ETT secretions  ?HCAP/VAP  P: [facility] 11/9 >NTD UC 11/9 >NTD Sputum 11/9 >NTD  Ceftazidime 11/9 >  PCT noted  Neuro:  A: Post op pain P: RASS goal -1 Fentanyl PRN Versed PRN.  Trach in place, transfer order to [facility] ICU in place.  Chemotherapy orders on the chart per H/O.  Please notify Oncologist upon arrival to [facility].  The patient is critically ill with multiple organ systems failure and requires high complexity decision making for assessment and support, frequent evaluation and titration of therapies, application of advanced monitoring technologies and extensive interpretation of multiple databases.   Critical Care Time devoted to patient care services described in this note is  35  Minutes. This time reflects time of care of this signee Dr Jennet Maduro. This critical care time does not reflect procedure time, or teaching time or supervisory time of PA/NP/Med student/Med Resident etc but could involve care discussion time.  Rush Farmer, M.D. [facility] Pulmonary/Critical Care Medicine. Pager: [phone]. After hours pager: [phone].

## 2015-02-02 NOTE — Care Management Note (Signed)
Case Management Note  Patient Details  Name: Maria Bauer MRN: [id number] Date of Birth: [birth year]  Subjective/Objective:   New Tracheostomy. To be transferred to [facility] for Chemo. Sedated on Vent.                  Action/Plan:Continue to follow.    Expected Discharge Date:                  Expected Discharge Plan:  Home/Self Care  In-House Referral:     Discharge planning Services  CM Consult  Post Acute Care Choice:    Choice offered to:     DME Arranged:    DME Agency:     HH Arranged:    HH Agency:     Status of Service:  In process, will continue to follow  Medicare Important Message Given:  Yes Date Medicare IM Given:    Medicare IM give by:    Date Additional Medicare IM Given:    Additional Medicare Important Message give by:     If discussed at [place] of Stay Meetings, dates discussed:    Additional Comments:  Delrae Sawyers, RN 02/02/2015, 2:10 PM

## 2015-02-02 NOTE — Progress Notes (Signed)
Per MD request when patient breaths over set rate of 20 return to a rate of 12. Patient rate is 24. RT reduced to a rate of 12. Patient RR now 18. Patient tolerating well. RT also reduced FIO2 to 70%. Patient sat 93%. RT will continue to monitor.

## 2015-02-02 NOTE — Procedures (Signed)
Percutaneous Tracheostomy Placement  Consent from family.  Patient sedated, paralyzed and position.  Placed on 100% FiO2 and RR matched.  Area cleaned and draped.  Lidocaine/epi injected.  Skin incision done followed by blunt dissection.  Trachea palpated then punctured, catheter passed and visualized bronchoscopically.  Wire placed and visualized.  Catheter removed.  Airway then crushed and dilated.  Size 6 cuffed shiley trach placed and visualized bronchoscopically well above carina.  Good volume returns.  Patient tolerated the procedure well without complications.  Minimal blood loss.  CXR ordered and pending.  Wesam G. Yacoub, M.D. [place] Pulmonary/Critical Care Medicine. Pager: [phone]. After hours pager: [phone]. 

## 2015-02-02 NOTE — Progress Notes (Signed)
eLink Physician-Brief Progress Note Patient Name: [place] Kraning DOB: 09-Oct-1945 MRN: [id number]   Date of Service  02/02/2015  HPI/Events of Note  Needs precedex renewed  eICU Interventions  done     Intervention Category Minor Interventions: Routine modifications to care plan (e.g. PRN medications for pain, fever)  Simonne Maffucci 02/02/2015, 3:25 PM

## 2015-02-02 NOTE — Progress Notes (Signed)
7 Days Post-Op Procedure(s) (LRB): PERICARDIAL WINDOW (N/A) MEDIASTINOTOMY CHAMBERLAIN MCNEIL (Left) Subjective: Status post pericardial window and left Chamberlain procedure Surgical incisions clean and dry Left pleural chest tube removed today Tracheostomy by critical care his plan today then transferred to [facility] for chemotherapy of extensive thoracic B-cell lymphoma. Patient maintains sinus rhythm, stable blood pressure  Objective: Vital signs in last 24 hours: Temp:  [96 F (35.6 C)-101.8 F (38.8 C)] 96 F (35.6 C) (11/11 0700) Pulse Rate:  [70-108] 74 (11/11 0804) Cardiac Rhythm:  [-] Normal sinus rhythm (11/11 0800) Resp:  [14-29] 16 (11/11 0804) BP: (90-138)/(46-89) 129/64 mmHg (11/11 0804) SpO2:  [92 %-98 %] 92 % (11/11 0804) FiO2 (%):  [40 %-50 %] 40 % (11/11 0804) Weight:  [209 lb 14.1 oz (95.2 kg)] 209 lb 14.1 oz (95.2 kg) (11/11 0500)  Hemodynamic parameters for last 24 hours:   stable  Intake/Output from previous day: 11/10 0701 - 11/11 0700 In: 2039.2 [I.V.:1292.5; NG/GT:546.7; IV Piggyback:200] Out: 4430 [Urine:4160; Stool:250; Chest Tube:20] Intake/Output this shift: Total I/O In: 167 [I.V.:167] Out: 125 [Urine:125]  Breath sounds continued to be diminished on left side secondary to large thoracic lymphoma Abdomen soft Extremities warm Regular heart rate  Lab Results:  Recent Labs  02/01/15 0500 02/02/15 0451  WBC 6.6 5.3  HGB 10.5* 10.5*  HCT 33.8* 34.0*  PLT 273 329   BMET:  Recent Labs  02/01/15 0500 02/02/15 0451  NA 148* 147*  K 3.7 4.1  CL 112* 107  CO2 29 30  GLUCOSE 173* 216*  BUN 36* 40*  CREATININE 0.55 0.64  CALCIUM 9.5 9.9    PT/INR:  Recent Labs  02/02/15 0451  LABPROT 16.6*  INR 1.33   ABG    Component Value Date/Time   PHART 7.442 02/02/2015 0412   HCO3 28.0* 02/02/2015 0412   TCO2 29.2 02/02/2015 0412   ACIDBASEDEF 1.0 01/29/2015 0410   O2SAT 90.5 02/02/2015 0412   CBG (last 3)   Recent Labs  02/01/15 1927 02/01/15 2352 02/02/15 0320  GLUCAP 194* 271* 207*    Assessment/Plan: S/P Procedure(s) (LRB): PERICARDIAL WINDOW (N/A) MEDIASTINOTOMY CHAMBERLAIN MCNEIL (Left) Patient for tracheostomy then transferred to critical care service at [facility] for further vent weaning and chemotherapy for thoracic B-cell lymphoma   LOS: 8 days    Maria Bauer 02/02/2015

## 2015-02-02 NOTE — Procedures (Signed)
Video Bronchoscopy Procedure Note  Pre-Procedure Diagnoses: 1.  Acute Hypoxemic Respiratory Failure  Procedures Performed: 1. Bronchoscopy with Airway Inspection 2. Percutaneous Tracheostomy Placement  Medications:  As per RN documentation.  Description of Procedure: Procedure performed at bedside. Timeout performed to identify correct patient and procedure. Medications were administered by nurse at bedside. Bronchoscope was then inserted into the endotracheal tube.  The bronchoscope was then used to suction secretions from the trachea.  Under direct visualization percutaneous tracheostomy placement was performed. The bronchoscope was then removed from the endotracheal tube and inserted into the tracheostomy with excellent visualization of the carina and placement. The bronchoscope was then removed and trachea was secured in place.  Blood Loss:  Less than 10cc.  Complications:  None.  Post Procedure Stat Portable CXR:  Ordered and pending.  Post Procedure Condition:  Remains critically ill on ventilator.

## 2015-02-02 NOTE — Progress Notes (Addendum)
Increased RR 20 per Dr Nelda Marseille for trach procedure

## 2015-02-02 NOTE — Progress Notes (Signed)
BSA, Dosages and Dilutions of Adriamycin, Cytoxan and Vincristine verified with 2nd RN Laural Benes.

## 2015-02-02 NOTE — Progress Notes (Signed)
Pt tolerated chemo today without complications. Bedside RN made aware.

## 2015-02-03 ENCOUNTER — Other Ambulatory Visit: Payer: Self-pay | Admitting: Hematology

## 2015-02-03 ENCOUNTER — Inpatient Hospital Stay (HOSPITAL_COMMUNITY): Payer: Medicare (Managed Care)

## 2015-02-03 DIAGNOSIS — C8330 Diffuse large B-cell lymphoma, unspecified site: Secondary | ICD-10-CM

## 2015-02-03 DIAGNOSIS — R740 Nonspecific elevation of levels of transaminase and lactic acid dehydrogenase [LDH]: Secondary | ICD-10-CM

## 2015-02-03 LAB — COMPREHENSIVE METABOLIC PANEL
ALT: 24 U/L (ref 14–54)
ANION GAP: 11 (ref 5–15)
AST: 23 U/L (ref 15–41)
Albumin: 2.3 g/dL — ABNORMAL LOW (ref 3.5–5.0)
Alkaline Phosphatase: 159 U/L — ABNORMAL HIGH (ref 38–126)
BILIRUBIN TOTAL: 1.1 mg/dL (ref 0.3–1.2)
BUN: 53 mg/dL — AB (ref 6–20)
CO2: 28 mmol/L (ref 22–32)
Calcium: 9 mg/dL (ref 8.9–10.3)
Chloride: 105 mmol/L (ref 101–111)
Creatinine, Ser: 0.67 mg/dL (ref 0.44–1.00)
Glucose, Bld: 203 mg/dL — ABNORMAL HIGH (ref 65–99)
POTASSIUM: 3.3 mmol/L — AB (ref 3.5–5.1)
Sodium: 144 mmol/L (ref 135–145)
TOTAL PROTEIN: 6 g/dL — AB (ref 6.5–8.1)

## 2015-02-03 LAB — GLUCOSE, CAPILLARY
GLUCOSE-CAPILLARY: 179 mg/dL — AB (ref 65–99)
GLUCOSE-CAPILLARY: 190 mg/dL — AB (ref 65–99)
GLUCOSE-CAPILLARY: 200 mg/dL — AB (ref 65–99)
Glucose-Capillary: 181 mg/dL — ABNORMAL HIGH (ref 65–99)
Glucose-Capillary: 204 mg/dL — ABNORMAL HIGH (ref 65–99)
Glucose-Capillary: 227 mg/dL — ABNORMAL HIGH (ref 65–99)

## 2015-02-03 LAB — URIC ACID: URIC ACID, SERUM: 4.6 mg/dL (ref 2.3–6.6)

## 2015-02-03 LAB — BLOOD GAS, ARTERIAL
Acid-Base Excess: 1.9 mmol/L (ref 0.0–2.0)
Bicarbonate: 26.6 mEq/L — ABNORMAL HIGH (ref 20.0–24.0)
Drawn by: 345601
FIO2: 0.7
MECHVT: 440 mL
O2 Saturation: 92.6 %
PEEP: 5 cmH2O
Patient temperature: 98.6
RATE: 12 resp/min
TCO2: 24.4 mmol/L (ref 0–100)
pCO2 arterial: 44.2 mmHg (ref 35.0–45.0)
pH, Arterial: 7.396 (ref 7.350–7.450)
pO2, Arterial: 70.3 mmHg — ABNORMAL LOW (ref 80.0–100.0)

## 2015-02-03 LAB — CBC WITH DIFFERENTIAL/PLATELET
Basophils Absolute: 0 10*3/uL (ref 0.0–0.1)
Basophils Relative: 0 %
EOS PCT: 0 %
Eosinophils Absolute: 0 10*3/uL (ref 0.0–0.7)
HEMATOCRIT: 32.8 % — AB (ref 36.0–46.0)
Hemoglobin: 10.6 g/dL — ABNORMAL LOW (ref 12.0–15.0)
LYMPHS PCT: 5 %
Lymphs Abs: 0.5 10*3/uL — ABNORMAL LOW (ref 0.7–4.0)
MCH: 31.7 pg (ref 26.0–34.0)
MCHC: 32.3 g/dL (ref 30.0–36.0)
MCV: 98.2 fL (ref 78.0–100.0)
MONO ABS: 0.8 10*3/uL (ref 0.1–1.0)
MONOS PCT: 9 %
NEUTROS ABS: 8 10*3/uL — AB (ref 1.7–7.7)
Neutrophils Relative %: 86 %
Platelets: 337 10*3/uL (ref 150–400)
RBC: 3.34 MIL/uL — ABNORMAL LOW (ref 3.87–5.11)
RDW: 15.2 % (ref 11.5–15.5)
WBC: 9.3 10*3/uL (ref 4.0–10.5)

## 2015-02-03 LAB — LACTATE DEHYDROGENASE: LDH: 609 U/L — AB (ref 98–192)

## 2015-02-03 LAB — MAGNESIUM: MAGNESIUM: 2.6 mg/dL — AB (ref 1.7–2.4)

## 2015-02-03 LAB — PHOSPHORUS: PHOSPHORUS: 3.7 mg/dL (ref 2.5–4.6)

## 2015-02-03 MED ORDER — EPINEPHRINE HCL 1 MG/ML IJ SOLN
0.5000 mg | Freq: Once | INTRAMUSCULAR | Status: DC | PRN
  Filled 2015-02-03: qty 1

## 2015-02-03 MED ORDER — EPINEPHRINE HCL 0.1 MG/ML IJ SOSY: 0.2500 mg | PREFILLED_SYRINGE | Freq: Once | INTRAMUSCULAR | Status: DC | PRN

## 2015-02-03 MED ORDER — DIPHENHYDRAMINE HCL 50 MG PO CAPS
50.0000 mg | ORAL_CAPSULE | Freq: Once | ORAL | Status: DC
  Filled 2015-02-03: qty 1

## 2015-02-03 MED ORDER — ALLOPURINOL 100 MG PO TABS
150.0000 mg | ORAL_TABLET | Freq: Two times a day (BID) | ORAL | Status: DC
  Administered 2015-02-03 (×2): 150 mg
  Filled 2015-02-03 (×2): qty 2

## 2015-02-03 MED ORDER — RANITIDINE HCL 150 MG/10ML PO SYRP
150.0000 mg | ORAL_SOLUTION | Freq: Two times a day (BID) | ORAL | Status: DC
  Administered 2015-02-03 – 2015-02-14 (×23): 150 mg via ORAL
  Filled 2015-02-03 (×26): qty 10

## 2015-02-03 MED ORDER — HEPARIN SOD (PORK) LOCK FLUSH 100 UNIT/ML IV SOLN
500.0000 [IU] | Freq: Once | INTRAVENOUS | Status: DC | PRN
  Filled 2015-02-03: qty 5

## 2015-02-03 MED ORDER — ENOXAPARIN SODIUM 40 MG/0.4ML ~~LOC~~ SOLN
40.0000 mg | Freq: Every day | SUBCUTANEOUS | Status: DC
  Administered 2015-02-03 – 2015-02-08 (×6): 40 mg via SUBCUTANEOUS
  Filled 2015-02-03 (×6): qty 0.4

## 2015-02-03 MED ORDER — ACETAMINOPHEN 325 MG PO TABS
650.0000 mg | ORAL_TABLET | Freq: Once | ORAL | Status: DC
  Filled 2015-02-03: qty 2

## 2015-02-03 MED ORDER — SODIUM CHLORIDE 0.9 % IJ SOLN: 10.0000 mL | INTRAMUSCULAR | Status: DC | PRN

## 2015-02-03 MED ORDER — DIPHENHYDRAMINE HCL 50 MG/ML IJ SOLN: 50.0000 mg | Freq: Once | INTRAMUSCULAR | Status: DC | PRN

## 2015-02-03 MED ORDER — SODIUM CHLORIDE 0.9 % IV SOLN: Freq: Once | INTRAVENOUS | Status: DC | PRN

## 2015-02-03 MED ORDER — SODIUM CHLORIDE 0.9 % IV SOLN: Freq: Once | INTRAVENOUS | Status: DC

## 2015-02-03 MED ORDER — SODIUM CHLORIDE 0.9 % IV SOLN: 375.0000 mg/m2 | Freq: Once | INTRAVENOUS | Status: DC

## 2015-02-03 MED ORDER — DIPHENHYDRAMINE HCL 50 MG/ML IJ SOLN
25.0000 mg | Freq: Once | INTRAMUSCULAR | Status: DC | PRN
Start: 2015-02-03 — End: 2015-02-03

## 2015-02-03 MED ORDER — ALTEPLASE 2 MG IJ SOLR
2.0000 mg | Freq: Once | INTRAMUSCULAR | Status: DC | PRN
  Filled 2015-02-03: qty 2

## 2015-02-03 MED ORDER — HEPARIN SOD (PORK) LOCK FLUSH 100 UNIT/ML IV SOLN
250.0000 [IU] | Freq: Once | INTRAVENOUS | Status: DC | PRN
Start: 2015-02-03 — End: 2015-02-03
  Filled 2015-02-03: qty 3

## 2015-02-03 MED ORDER — SODIUM CHLORIDE 0.9 % IJ SOLN: 3.0000 mL | INTRAMUSCULAR | Status: DC | PRN

## 2015-02-03 MED ORDER — POTASSIUM CHLORIDE 20 MEQ/15ML (10%) PO SOLN
40.0000 meq | Freq: Once | ORAL | Status: AC
  Administered 2015-02-03: 40 meq via ORAL
  Filled 2015-02-03: qty 30

## 2015-02-03 MED ORDER — ALBUTEROL SULFATE (2.5 MG/3ML) 0.083% IN NEBU: 2.5000 mg | INHALATION_SOLUTION | Freq: Once | RESPIRATORY_TRACT | Status: DC | PRN

## 2015-02-03 MED ORDER — FAMOTIDINE IN NACL 20-0.9 MG/50ML-% IV SOLN: 20.0000 mg | Freq: Once | INTRAVENOUS | Status: DC | PRN

## 2015-02-03 MED ORDER — FAMOTIDINE 40 MG/5ML PO SUSR: 20.0000 mg | Freq: Two times a day (BID) | ORAL | Status: DC

## 2015-02-03 MED ORDER — METHYLPREDNISOLONE SODIUM SUCC 125 MG IJ SOLR: 125.0000 mg | Freq: Once | INTRAMUSCULAR | Status: DC | PRN

## 2015-02-03 MED ORDER — CEFTRIAXONE SODIUM 1 G IJ SOLR
1.0000 g | INTRAMUSCULAR | Status: DC
  Administered 2015-02-03 – 2015-02-06 (×4): 1 g via INTRAVENOUS
  Filled 2015-02-03 (×4): qty 10

## 2015-02-03 NOTE — Progress Notes (Signed)
ANTICOAGULATION CONSULT NOTE - Initial Consult  Pharmacy Consult for Lovenox Indication: VTE prophylaxis  Allergies  Allergen Reactions  . Morphine And Related Hives and Itching  . Norco [Hydrocodone-Acetaminophen] Hives and Itching    Patient Measurements: Height: 5\' 4"  (162.6 cm) Weight: 203 lb 11.3 oz (92.4 kg) IBW/kg (Calculated) : 54.7  Vital Signs: Temp: 97 F (36.1 C) (11/12 0400) Temp Source: Axillary (11/12 0400) BP: 102/51 mmHg (11/12 0809) Pulse Rate: 87 (11/12 0809)  Labs:  Recent Labs  02/01/15 0500 02/02/15 0451 02/03/15 0435  HGB 10.5* 10.5* 10.6*  HCT 33.8* 34.0* 32.8*  PLT 273 329 337  LABPROT  --  16.6*  --   INR  --  1.33  --   CREATININE 0.55 0.64 0.67    Estimated Creatinine Clearance: 72.1 mL/min (by C-G formula based on Cr of 0.67).   Medical History: Past Medical History  Diagnosis Date  . High cholesterol   . Hypertension   . Acid reflux   . Insomnia   . Scoliosis   . Asthma     "stopped in the 1990's"  . Anemia   . Peptic ulcer   . Daily headache   . Rheumatoid arthritis ([place])     "everywhere" (02/20/2015)  . Chronic lower back pain     "mostly on the right" (02/15/2015)  . Anxiety   . Melanoma ([place]) 11/2011    "left forearm"    Medications:  Prescriptions prior to admission  Medication Sig Dispense Refill Last Dose  . albuterol (PROVENTIL) (5 MG/ML) 0.5% nebulizer solution Take 2.5 mg by nebulization every 6 (six) hours as needed for wheezing or shortness of breath.   01/24/2015 at Unknown time  . alendronate (FOSAMAX) 70 MG tablet Take 70 mg by mouth once a week. Take with a full glass of water on an empty stomach. Take along with methotrexate.   01/20/2015 at Unknown time  . Ascorbic Acid (VITAMIN C) 1000 MG tablet Take 1,000 mg by mouth daily.   02/13/2015 at Unknown time  . benzonatate (TESSALON) 100 MG capsule TAKE ONE CAPSULE BY MOUTH 3 TIMES A DAY (NOT COVERED BY INSURANCE)  1 01/28/2015 at Unknown time  . Calcium  Carbonate-Vit D-Min (CALCIUM 1200 PO) Take 1 tablet by mouth.   01/28/2015 at Unknown time  . folic acid (FOLVITE) 1 MG tablet Take 1 mg by mouth daily.   02/18/2015 at Unknown time  . Hydrocod Polst-Chlorphen Polst (TUSSIONEX PENNKINETIC ER PO) Take 10 mg by mouth.   01/23/2015 at Unknown time  . LORazepam (ATIVAN) 0.5 MG tablet Take by mouth every 8 (eight) hours.   01/24/2015 at Unknown time  . methotrexate (RHEUMATREX) 2.5 MG tablet Take 15 mg by mouth once a week. Caution:Chemotherapy. Protect from light. Take 6 tablets weekly.   01/20/2015 at Unknown time  . metoprolol (LOPRESSOR) 50 MG tablet Take 50 mg by mouth 2 (two) times daily.   02/09/2015 at 0900  . predniSONE (DELTASONE) 10 MG tablet Take 20 mg by mouth daily with breakfast.    02/13/2015 at Unknown time  . simvastatin (ZOCOR) 20 MG tablet Take 20 mg by mouth daily.   01/24/2015 at Unknown time  . Vitamin D, Ergocalciferol, (DRISDOL) 50000 UNITS CAPS capsule Take 50,000 Units by mouth every 30 (thirty) days.   Past Month at Unknown time  . acetaminophen (TYLENOL) 325 MG tablet Take 650 mg by mouth every 6 (six) hours as needed for moderate pain.   unknown at Unknown time  .  ranitidine (ZANTAC) 150 MG tablet Take 150 mg by mouth 2 (two) times daily.   unknown   Scheduled:  . sodium chloride   Intravenous Once  . allopurinol  150 mg Per Tube BID  . antiseptic oral rinse  7 mL Mouth Rinse QID  . bisacodyl  10 mg Oral Daily  . cefTRIAXone (ROCEPHIN)  IV  1 g Intravenous Q24H  . chlorhexidine gluconate  15 mL Mouth Rinse BID  . feeding supplement (PRO-STAT SUGAR FREE 64)  60 mL Per Tube TID  . feeding supplement (VITAL HIGH PROTEIN)  1,000 mL Per Tube Q24H  . free water  250 mL Per Tube Q6H  . insulin aspart  0-20 Units Subcutaneous 6 times per day  . insulin detemir  18 Units Subcutaneous Daily  . methylPREDNISolone (SOLU-MEDROL) injection  125 mg Intravenous Q12H  . metoCLOPramide (REGLAN) injection  10 mg Intravenous 4 times per day   . potassium chloride  40 mEq Oral Once  . ranitidine  150 mg Oral BID  . senna-docusate  1 tablet Oral QHS   Assessment: 69 yo female with mediastinal mass and pericardial effusion 2nd to Large B cell lymphoma.  Underwent placement of pericardial window 11/4.  Trach placed, transferred to [facility] to receive chemotherapy.  Currently on mechanical VTE ppx only, now to begin Lovenox per Pharmacy.   CBC: stable with Hgb sl low  SCr: stable wnl  Previous anticoagulation: none  No documented bleeding  Goal of Therapy: Prevention of VTE  Plan:  Lovenox 40 mg SQ q24 hr  Pharmacy will sign off   Reuel Boom, PharmD, BCPS Pager: [phone] 02/03/2015, 8:25 AM

## 2015-02-03 NOTE — Progress Notes (Signed)
Name: Maria Bauer MRN: [id number] DOB: 06/08/1945    ADMISSION DATE:  02/07/2015  CONSULTATION DATE:  02/06/2015  REFERRING MD :  Dr. Harrington Challenger  CHIEF COMPLAINT:  Pericardial effusion  BRIEF PATIENT DESCRIPTION:  69 yo female with mediastinal mass and pericardial effusion 2nd to Large B cell lymphoma.  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  SUBJECTIVE:  Started chemo yesterday  VITAL SIGNS: BP 94/53 mmHg  Pulse 54  Temp(Src) 97 F (36.1 C) (Axillary)  Resp 11  Ht 5\' 4"  (1.626 m)  Wt 203 lb 11.3 oz (92.4 kg)  BMI 34.95 kg/m2  SpO2 96%  INTAKE/OUTPUT: I/O last 3 completed shifts: In: 3202.6 [I.V.:2345.9; NG/GT:706.7; IV Piggyback:150] Out: [id number] [Urine:3495; Stool:150]  PHYSICAL EXAMINATION:  General: sedated HENT: trach site clean PULM: decreased BS on Lt CV: regular GI: soft, non tender MSK: no edema Neuro: RASS -2  CBC Recent Labs     02/01/15  0500  02/02/15  0451  02/03/15  0435  WBC  6.6  5.3  9.3  HGB  10.5*  10.5*  10.6*  HCT  33.8*  34.0*  32.8*  PLT  273  329  337    Coag's Recent Labs     02/02/15  0451  INR  1.33    BMET Recent Labs     02/01/15  0500  02/02/15  0451  02/03/15  0435  NA  148*  147*  144  K  3.7  4.1  3.3*  CL  112*  107  105  CO2  29  30  28   BUN  36*  40*  53*  CREATININE  0.55  0.64  0.67  GLUCOSE  173*  216*  203*    Electrolytes Recent Labs     02/01/15  0500  02/01/15  1630  02/02/15  0451  02/03/15  0435  CALCIUM  9.5   --   9.9  9.0  MG  2.3   --   2.4  2.6*  PHOS  2.4*  2.8  3.0  3.7    Sepsis Markers Recent Labs     01/31/15  1130  02/01/15  0500  02/02/15  0451  PROCALCITON  3.20  4.79  2.87    ABG Recent Labs     02/01/15  0540  02/02/15  0412  02/03/15  0320  PHART  7.449  7.442  7.396  PCO2ART  39.2  42.5  44.2  PO2ART  62.8*  66.9*  70.3*    Liver Enzymes Recent Labs     02/01/15  0500  02/02/15  0451  02/03/15  0435  AST  22  34  23  ALT  20  28  24   ALKPHOS  81  163*  159*  BILITOT  0.8  1.1  1.1  ALBUMIN  2.0*  2.2*  2.3*    Glucose Recent Labs     02/02/15  0755  02/02/15  1146  02/02/15  1532  02/02/15  2007  02/03/15  0015  02/03/15  0405  GLUCAP  156*  172*  198*  162*  190*  181*    Imaging Ct  Abdomen Pelvis Wo Contrast  02/01/2015  CLINICAL DATA:  Diffuse large B-cell lymphoma undergoing chemotherapy. EXAM: CT ABDOMEN AND PELVIS WITHOUT CONTRAST TECHNIQUE: Multidetector CT imaging of the abdomen and pelvis was performed following the standard protocol without IV contrast. COMPARISON:  Recent chest CT 01/19/2015 FINDINGS: Examination is degraded by motion. Lower chest: There is air in the left chest wall likely from recent biopsy/surgery. The left-sided chest tube is in place. Extensive left lower lobe airspace consolidation which could reflect atelectasis or pneumonia. Small bilateral pleural effusions. Right basilar atelectasis. There is an NG tube coursing down the esophagus and into the stomach. Bulky mediastinal lymphadenopathy again demonstrated. Coronary artery calcifications are noted. Hepatobiliary: No focal hepatic lesions except for a small cyst in the right hepatic lobe inferiorly. No evidence of metastatic disease without IV contrast. The gallbladder is grossly normal. No common bile duct dilatation. Pancreas: No mass, inflammation or ductal dilatation. Spleen: Normal size.  No definite lesions. Adrenals/Urinary Tract: The adrenal glands are normal. Both kidneys are normal except for a right renal calculus. No hydronephrosis. Stomach/Bowel: The stomach contains an NG tube. The  duodenum, small bowel and colon are unremarkable. No inflammatory changes, mass lesions or obstructive findings. A rectal catheter is in place. Vascular/Lymphatic: No mesenteric or retroperitoneal mass or adenopathy. Small scattered lymph nodes are noted. There are moderate to advanced aortic calcifications but no focal aneurysm. Other: No pelvic mass or adenopathy. No free pelvic fluid collections. A Foley catheter is noted in the bladder. The uterus is surgically absent. No inguinal mass or adenopathy. Musculoskeletal: No significant bony findings. Degenerative changes involving the spine with severe facet disease. There is a remote appearing compression fracture of L1. IMPRESSION: 1. Left-sided chest tube in place with a small left pleural effusion. Significant left lower lobe airspace consolidation suggesting pneumonia or dense atelectasis. 2. Bulky mediastinal lymphadenopathy. 3. No adenopathy in the abdomen or pelvis. 4. No acute abdominal/pelvic findings. Electronically Signed   By: Marijo Sanes M.D.   On: 02/01/2015 21:33   Dg Chest Port 1 View  02/02/2015  CLINICAL DATA:  Tracheostomy tube placement. EXAM: PORTABLE CHEST 1 VIEW COMPARISON:  Earlier film 02/02/2015. FINDINGS: The tracheostomy tube is in good position with the tip at the mid tracheal level. The right IJ catheter is stable. Stable mediastinal adenopathy/mass and persistent left lower lobe infiltrate/atelectasis/effusion. IMPRESSION: Tracheostomy tube in good position without complicating features. Electronically Signed   By: Marijo Sanes M.D.   On: 02/02/2015 12:18   Dg Chest Port 1 View  02/02/2015  CLINICAL DATA:  Shortness of breath. EXAM: PORTABLE CHEST 1 VIEW COMPARISON:  02/01/2015 FINDINGS: Examination is partially limited by leftward patient rotation. Endotracheal tube terminates above the level of the thoracic inlet and has likely been slightly retracted in the interim. Enteric tube courses towards the left upper abdomen  with tip not imaged. Right jugular central venous catheter remains, projecting over the SVC. Known, large mediastinal mass is again seen. Cardiac silhouette remains enlarged. Left chest tube remains in place. No pneumothorax is identified. Left basilar opacities have slightly improved. Mild right basilar opacity is unchanged may reflect atelectasis. IMPRESSION: 1. Mild interval retraction of the endotracheal tube which projects above the thoracic inlet. Consider advancement. 2. Minimally improved aeration of the left lung base. No pneumothorax. Electronically Signed   By: Logan Bores M.D.   On: 02/02/2015 07:18    ASSESSMENT / PLAN:  PULMONARY Trach 11/11 >> A: Acute respiratory failure. Failure to wean from vent s/p trach.  P: Pressure support wean as tolerated  F/u CXR intermittently  CARDIOVASCULAR: Rt IJ CVL 11/04 >> A: Malignant pericardial effusion s/p window. Hypotension likely from hypovolemia and sedation >> improved. Hx of HLD, HTN. P: Hold outpt zocor, lopressor Monitor hemodynamics  RENAL A:  Hypokalemia. P: Replace electrolytes as needed  GASTROENTEROLOGY:  A:  Nutrition. Gastroparesis. P:  Tube feeds Pepcid for SUP Continue reglan for now  HEMATOLOGY/ONCOLOGY: A: B cell lymphoma. Anemia of critical illness. P: Chemotherapy (R-CHOP) >> using solumedrol in place of prednisone Allopurinol for tumor lysis syndrome prophylaxis F/u CBC Lovenox for DVT prevention  ID:  A: Fever. P: Day 4 of Abx, change to rocephin 11/12  Blood 11/09 >>  Sputum 11/09 >> Moderate Streptococcus, beta hemolytic  ENDOCRINE: A: Steroid induced hyperglycemia. P: SSI with levemir  NEUROLOGY:  A:  Agitation. P: RASS goal 0  RHEUMATOLOGY: A: Hx of RA >> was on MTX as outpt. P: Will need to re-assess as outpt  Will change to PCCM service.  CC time 34 minutes  Chesley Mires, MD [place] 02/03/2015, 8:02 AM Pager:  [phone] After  3pm call: [phone]

## 2015-02-03 NOTE — Progress Notes (Signed)
Maria Bauer   DOB:1945-12-06   HY#:[id number]   RSW#:[id number]  Service: medical oncology  Subjective: Pt tolerated  Chemo CHOP well yesterday, no retraction or significant nausea. She is quite uncomfortable with this tracheostomy, on fentanyl drip. Urine output adequate, 4 L yesterday. Morning lab reviewed, no signs of tumor lysis. Her vital signs stable, afebrile.   Objective:  Filed Vitals:   02/03/15 1100  BP: 132/49  Pulse: 91  Temp:   Resp: 18    Body mass index is 34.95 kg/(m^2).  Intake/Output Summary (Last 24 hours) at 02/03/15 1142 Last data filed at 02/03/15 1000  Gross per 24 hour  Intake 2249.33 ml  Output   1535 ml  Net 714.33 ml    Sedated, (+) tracheostomy  Sclerae unicteric  No peripheral adenopathy  Lungs clear -- no rales or rhonchi  Heart regular rate and rhythm  Abdomen benign  MSK no focal spinal tenderness  CBG (last 3)   Recent Labs  02/03/15 0015 02/03/15 0405 02/03/15 0807  GLUCAP 190* 181* 204*     Labs:  Lab Results  Component Value Date   WBC 9.3 02/03/2015   HGB 10.6* 02/03/2015   HCT 32.8* 02/03/2015   MCV 98.2 02/03/2015   PLT 337 02/03/2015   NEUTROABS 8.0* 02/03/2015    @LASTCHEMISTRY @  Urine Studies No results for input(s): UHGB, CRYS in the last 72 hours.  Invalid input(s): UACOL, UAPR, USPG, UPH, UTP, UGL, [place], UBIL, UNIT, UROB, [place], UEPI, [place], [place]  Basic Metabolic Panel:  Recent Labs Lab 01/30/15 0415 01/30/15 1702 01/31/15 0413 02/01/15 0500 02/01/15 1630 02/02/15 0451 02/03/15 0435  NA 145 144 145 148*  --  147* 144  K 4.3 4.6 3.6 3.7  --  4.1 3.3*  CL 112* 107 111 112*  --  107 105  CO2 24  --  25 29  --  30 28  GLUCOSE 127* 181* 198* 173*  --  216* 203*  BUN 21* 34* 30* 36*  --  40* 53*  CREATININE 0.55 0.50 0.52 0.55  --  0.64 0.67  CALCIUM 9.1  --  9.1 9.5  --  9.9 9.0  MG 2.1  --  2.2 2.3  --  2.4 2.6*  PHOS 3.3  --  2.5 2.4* 2.8 3.0 3.7   GFR Estimated Creatinine  Clearance: 72.1 mL/min (by C-G formula based on Cr of 0.67). Liver Function Tests:  Recent Labs Lab 01/30/15 0415 01/31/15 0413 02/01/15 0500 02/02/15 0451 02/03/15 0435  AST 36 26 22 34 23  ALT 24 19 20 28 24   ALKPHOS 60 75 81 163* 159*  BILITOT 1.1 1.2 0.8 1.1 1.1  PROT 5.4* 5.3* 5.3* 6.2* 6.0*  ALBUMIN 2.4* 2.0* 2.0* 2.2* 2.3*   No results for input(s): LIPASE, AMYLASE in the last 168 hours. No results for input(s): AMMONIA in the last 168 hours. Coagulation profile  Recent Labs Lab 02/02/15 0451  INR 1.33    CBC:  Recent Labs Lab 01/30/15 0415 01/30/15 1702 01/31/15 0413 02/01/15 0500 02/02/15 0451 02/03/15 0435  WBC 12.5*  --  6.9 6.6 5.3 9.3  NEUTROABS  --   --   --   --   --  8.0*  HGB 12.0 13.6 11.5* 10.5* 10.5* 10.6*  HCT 38.6 40.0 36.6 33.8* 34.0* 32.8*  MCV 97.2  --  97.1 97.7 97.1 98.2  PLT 278  --  251 273 329 337   Cardiac Enzymes: No results for input(s):  CKTOTAL, CKMB, CKMBINDEX, TROPONINI in the last 168 hours. BNP: Invalid input(s): POCBNP CBG:  Recent Labs Lab 02/02/15 1532 02/02/15 2007 02/03/15 0015 02/03/15 0405 02/03/15 0807  GLUCAP 198* 162* 190* 181* 204*   D-Dimer No results for input(s): DDIMER in the last 72 hours. Hgb A1c No results for input(s): HGBA1C in the last 72 hours. Lipid Profile No results for input(s): CHOL, HDL, LDLCALC, TRIG, CHOLHDL, LDLDIRECT in the last 72 hours. Thyroid function studies No results for input(s): TSH, T4TOTAL, T3FREE, THYROIDAB in the last 72 hours.  Invalid input(s): FREET3 Anemia work up No results for input(s): VITAMINB12, FOLATE, FERRITIN, TIBC, IRON, RETICCTPCT in the last 72 hours. Microbiology Recent Results (from the past 240 hour(s))  MRSA PCR Screening     Status: None   Collection Time: 02/20/2015  5:17 PM  Result Value Ref Range Status   MRSA by PCR NEGATIVE NEGATIVE Final    Comment:        The GeneXpert MRSA Assay (FDA approved for NASAL specimens only), is one  component of a comprehensive MRSA colonization surveillance program. It is not intended to diagnose MRSA infection nor to guide or monitor treatment for MRSA infections.   Culture, respiratory (NON-Expectorated)     Status: None   Collection Time: 01/27/15  3:30 PM  Result Value Ref Range Status   Specimen Description TRACHEAL ASPIRATE  Final   Special Requests Normal  Final   Gram Stain   Final    ABUNDANT WBC PRESENT,BOTH PMN AND MONONUCLEAR RARE SQUAMOUS EPITHELIAL CELLS PRESENT NO ORGANISMS SEEN Performed at [facility]    Culture   Final    NO GROWTH 2 DAYS Performed at [facility]    Report Status 01/29/2015 FINAL  Final  C difficile quick scan w PCR reflex     Status: None   Collection Time: 01/29/15  3:45 PM  Result Value Ref Range Status   C Diff antigen NEGATIVE NEGATIVE Final   C Diff toxin NEGATIVE NEGATIVE Final   C Diff interpretation Negative for toxigenic C. difficile  Final  Culture, blood (routine x 2)     Status: None (Preliminary result)   Collection Time: 01/30/15 10:18 PM  Result Value Ref Range Status   Specimen Description BLOOD RIGHT ANTECUBITAL  Final   Special Requests BOTTLES DRAWN AEROBIC AND ANAEROBIC 10CC  Final   Culture NO GROWTH 4 DAYS  Final   Report Status PENDING  Incomplete  Culture, blood (routine x 2)     Status: None (Preliminary result)   Collection Time: 01/30/15 10:28 PM  Result Value Ref Range Status   Specimen Description BLOOD RIGHT HAND  Final   Special Requests BOTTLES DRAWN AEROBIC AND ANAEROBIC 10CC  Final   Culture NO GROWTH 4 DAYS  Final   Report Status PENDING  Incomplete  Culture, respiratory (NON-Expectorated)     Status: None   Collection Time: 01/31/15 12:42 AM  Result Value Ref Range Status   Specimen Description TRACHEAL ASPIRATE  Final   Special Requests Normal  Final   Gram Stain   Final    MODERATE WBC PRESENT,BOTH PMN AND MONONUCLEAR RARE SQUAMOUS EPITHELIAL CELLS PRESENT FEW GRAM  NEGATIVE RODS RARE GRAM POSITIVE COCCI IN PAIRS IN CLUSTERS RARE GRAM NEGATIVE COCCI    Culture   Final    MODERATE STREPTOCOCCUS,BETA HEMOLYTIC NOT GROUP A Performed at [facility]    Report Status 02/02/2015 FINAL  Final      Studies:  Ct Abdomen  Pelvis Wo Contrast  02/01/2015  CLINICAL DATA:  Diffuse large B-cell lymphoma undergoing chemotherapy. EXAM: CT ABDOMEN AND PELVIS WITHOUT CONTRAST TECHNIQUE: Multidetector CT imaging of the abdomen and pelvis was performed following the standard protocol without IV contrast. COMPARISON:  Recent chest CT 01/19/2015 FINDINGS: Examination is degraded by motion. Lower chest: There is air in the left chest wall likely from recent biopsy/surgery. The left-sided chest tube is in place. Extensive left lower lobe airspace consolidation which could reflect atelectasis or pneumonia. Small bilateral pleural effusions. Right basilar atelectasis. There is an NG tube coursing down the esophagus and into the stomach. Bulky mediastinal lymphadenopathy again demonstrated. Coronary artery calcifications are noted. Hepatobiliary: No focal hepatic lesions except for a small cyst in the right hepatic lobe inferiorly. No evidence of metastatic disease without IV contrast. The gallbladder is grossly normal. No common bile duct dilatation. Pancreas: No mass, inflammation or ductal dilatation. Spleen: Normal size.  No definite lesions. Adrenals/Urinary Tract: The adrenal glands are normal. Both kidneys are normal except for a right renal calculus. No hydronephrosis. Stomach/Bowel: The stomach contains an NG tube. The duodenum, small bowel and colon are unremarkable. No inflammatory changes, mass lesions or obstructive findings. A rectal catheter is in place. Vascular/Lymphatic: No mesenteric or retroperitoneal mass or adenopathy. Small scattered lymph nodes are noted. There are moderate to advanced aortic calcifications but no focal aneurysm. Other: No pelvic mass or  adenopathy. No free pelvic fluid collections. A Foley catheter is noted in the bladder. The uterus is surgically absent. No inguinal mass or adenopathy. Musculoskeletal: No significant bony findings. Degenerative changes involving the spine with severe facet disease. There is a remote appearing compression fracture of L1. IMPRESSION: 1. Left-sided chest tube in place with a small left pleural effusion. Significant left lower lobe airspace consolidation suggesting pneumonia or dense atelectasis. 2. Bulky mediastinal lymphadenopathy. 3. No adenopathy in the abdomen or pelvis. 4. No acute abdominal/pelvic findings. Electronically Signed   By: Marijo Sanes M.D.   On: 02/01/2015 21:33   Dg Chest Port 1 View  02/03/2015  CLINICAL DATA:  Respiratory distress. Patient with a tracheostomy. Subsequent encounter. EXAM: PORTABLE CHEST 1 VIEW COMPARISON:  02/02/2015 FINDINGS: Masslike opacity obscures the left hilum and left mediastinum merging with the left heart border. This is without significant change. No convincing pulmonary edema. Probable atelectasis at the left lung base. No pneumothorax or obvious pleural effusion. Tracheostomy tube and right internal jugular central venous line are stable and well positioned. New orogastric tube passes below the diaphragm into the stomach. IMPRESSION: 1. No acute findings in the lungs. 2. Stable masslike opacity along the anterior mediastinum. 3. New orogastric tube is well positioned. Tracheostomy tube and right internal jugular central venous line are stable and also well positioned. Electronically Signed   By: Lajean Manes M.D.   On: 02/03/2015 08:34   Dg Chest Port 1 View  02/02/2015  CLINICAL DATA:  Tracheostomy tube placement. EXAM: PORTABLE CHEST 1 VIEW COMPARISON:  Earlier film 02/02/2015. FINDINGS: The tracheostomy tube is in good position with the tip at the mid tracheal level. The right IJ catheter is stable. Stable mediastinal adenopathy/mass and persistent left  lower lobe infiltrate/atelectasis/effusion. IMPRESSION: Tracheostomy tube in good position without complicating features. Electronically Signed   By: Marijo Sanes M.D.   On: 02/02/2015 12:18   Dg Chest Port 1 View  02/02/2015  CLINICAL DATA:  Shortness of breath. EXAM: PORTABLE CHEST 1 VIEW COMPARISON:  02/01/2015 FINDINGS: Examination is partially limited by leftward patient  rotation. Endotracheal tube terminates above the level of the thoracic inlet and has likely been slightly retracted in the interim. Enteric tube courses towards the left upper abdomen with tip not imaged. Right jugular central venous catheter remains, projecting over the SVC. Known, large mediastinal mass is again seen. Cardiac silhouette remains enlarged. Left chest tube remains in place. No pneumothorax is identified. Left basilar opacities have slightly improved. Mild right basilar opacity is unchanged may reflect atelectasis. IMPRESSION: 1. Mild interval retraction of the endotracheal tube which projects above the thoracic inlet. Consider advancement. 2. Minimally improved aeration of the left lung base. No pneumothorax. Electronically Signed   By: Logan Bores M.D.   On: 02/02/2015 07:18    Assessment: 69 y.o.   1. 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 Respiratory failure, s/p tracheostomy  3. Hospital-acquired pneumonia    Plan: -Patient tolerated the first dose CHOP well, lab reviewed no tumor lysis for now, LDH is trending down -My partner Dr. Irene Limbo planned her to have first dose Rituxan today. I think the benefit of Rituxan is questionable due to negative CD20 on the flow cytometry. I recommend to have a bone marrow biopsy to complete her staging and repeat CD20 if there is bone marrow involvement.  -I'll hold on Rituxan this weekend, and defer to Dr. Irene Limbo who will return on Monday and resume care.  -continue daily TLS lab, and  CBC with diff. Her wbc will likely be low next week, consider neupogen if neutropenia  -continue antibiotics, vent support and weaning per ICU team.  I will follow up tomorrow.    Truitt Merle, MD 02/03/2015  11:42 AM  [phone]

## 2015-02-04 ENCOUNTER — Inpatient Hospital Stay (HOSPITAL_COMMUNITY): Payer: Medicare (Managed Care)

## 2015-02-04 DIAGNOSIS — R8299 Other abnormal findings in urine: Secondary | ICD-10-CM

## 2015-02-04 DIAGNOSIS — E883 Tumor lysis syndrome: Secondary | ICD-10-CM

## 2015-02-04 LAB — COMPREHENSIVE METABOLIC PANEL
ALT: 20 U/L (ref 14–54)
AST: 15 U/L (ref 15–41)
Albumin: 2.5 g/dL — ABNORMAL LOW (ref 3.5–5.0)
Alkaline Phosphatase: 119 U/L (ref 38–126)
Anion gap: 6 (ref 5–15)
BUN: 86 mg/dL — ABNORMAL HIGH (ref 6–20)
CO2: 30 mmol/L (ref 22–32)
Calcium: 9.3 mg/dL (ref 8.9–10.3)
Chloride: 112 mmol/L — ABNORMAL HIGH (ref 101–111)
Creatinine, Ser: 0.77 mg/dL (ref 0.44–1.00)
GFR calc Af Amer: >60 mL/min (ref >60)
GFR calc non Af Amer: >60 mL/min (ref >60)
Glucose, Bld: 179 mg/dL — ABNORMAL HIGH (ref 65–99)
Potassium: 4.4 mmol/L (ref 3.5–5.1)
Sodium: 148 mmol/L — ABNORMAL HIGH (ref 135–145)
Total Bilirubin: 0.8 mg/dL (ref 0.3–1.2)
Total Protein: 5.8 g/dL — ABNORMAL LOW (ref 6.5–8.1)

## 2015-02-04 LAB — GLUCOSE, CAPILLARY
GLUCOSE-CAPILLARY: 204 mg/dL — AB (ref 65–99)
GLUCOSE-CAPILLARY: 199 mg/dL — AB (ref 65–99)
Glucose-Capillary: 241 mg/dL — ABNORMAL HIGH (ref 65–99)
Glucose-Capillary: 268 mg/dL — ABNORMAL HIGH (ref 65–99)
Glucose-Capillary: 143 mg/dL — ABNORMAL HIGH (ref 65–99)
Glucose-Capillary: 182 mg/dL — ABNORMAL HIGH (ref 65–99)

## 2015-02-04 LAB — CBC WITH DIFFERENTIAL/PLATELET
Basophils Absolute: 0 10*3/uL (ref 0.0–0.1)
Basophils Relative: 0 %
Eosinophils Absolute: 0 10*3/uL (ref 0.0–0.7)
Eosinophils Relative: 0 %
HCT: 34.4 % — ABNORMAL LOW (ref 36.0–46.0)
Hemoglobin: 10.3 g/dL — ABNORMAL LOW (ref 12.0–15.0)
Lymphocytes Relative: 6 %
Lymphs Abs: 0.4 10*3/uL — ABNORMAL LOW (ref 0.7–4.0)
MCH: 30.3 pg (ref 26.0–34.0)
MCHC: 29.9 g/dL — ABNORMAL LOW (ref 30.0–36.0)
MCV: 101.2 fL — ABNORMAL HIGH (ref 78.0–100.0)
Monocytes Absolute: 0.2 10*3/uL (ref 0.1–1.0)
Monocytes Relative: 3 %
Neutro Abs: 6.6 10*3/uL (ref 1.7–7.7)
Neutrophils Relative %: 91 %
Platelets: 403 10*3/uL — ABNORMAL HIGH (ref 150–400)
RBC: 3.4 MIL/uL — ABNORMAL LOW (ref 3.87–5.11)
RDW: 15.5 % (ref 11.5–15.5)
WBC: 7.2 10*3/uL (ref 4.0–10.5)

## 2015-02-04 LAB — BASIC METABOLIC PANEL
Anion gap: 8 (ref 5–15)
BUN: 89 mg/dL — AB (ref 6–20)
CHLORIDE: 113 mmol/L — AB (ref 101–111)
CO2: 29 mmol/L (ref 22–32)
CREATININE: 0.7 mg/dL (ref 0.44–1.00)
Calcium: 9.2 mg/dL (ref 8.9–10.3)
GFR calc non Af Amer: >60 mL/min (ref >60)
Glucose, Bld: 239 mg/dL — ABNORMAL HIGH (ref 65–99)
POTASSIUM: 4.4 mmol/L (ref 3.5–5.1)
Sodium: 150 mmol/L — ABNORMAL HIGH (ref 135–145)

## 2015-02-04 LAB — CULTURE, BLOOD (ROUTINE X 2)
Culture: NO GROWTH
Culture: NO GROWTH

## 2015-02-04 LAB — URIC ACID: Uric Acid, Serum: 5.2 mg/dL (ref 2.3–6.6)

## 2015-02-04 LAB — LACTATE DEHYDROGENASE: LDH: 617 U/L — ABNORMAL HIGH (ref 98–192)

## 2015-02-04 LAB — PHOSPHORUS
Phosphorus: 5.1 mg/dL — ABNORMAL HIGH (ref 2.5–4.6)
Phosphorus: 3.8 mg/dL (ref 2.5–4.6)

## 2015-02-04 MED ORDER — INSULIN DETEMIR 100 UNIT/ML ~~LOC~~ SOLN
22.0000 [IU] | Freq: Every day | SUBCUTANEOUS | Status: DC
  Administered 2015-02-04: 22 [IU] via SUBCUTANEOUS
  Filled 2015-02-04 (×2): qty 0.22

## 2015-02-04 MED ORDER — ALLOPURINOL 100 MG PO TABS
300.0000 mg | ORAL_TABLET | Freq: Two times a day (BID) | ORAL | Status: DC
  Administered 2015-02-04 – 2015-02-06 (×5): 300 mg
  Filled 2015-02-04 (×5): qty 3

## 2015-02-04 MED ORDER — SODIUM CHLORIDE 0.9 % IV SOLN
INTRAVENOUS | Status: DC
Start: 2015-02-04 — End: 2015-02-05
  Administered 2015-02-04: 11:00:00 via INTRAVENOUS
  Administered 2015-02-05: 75 mL via INTRAVENOUS

## 2015-02-04 NOTE — Progress Notes (Signed)
Maria Bauer   DOB:1945-05-07   [id number]   BP:[id number]  Service: medical oncology  Subjective: Pt is clinically stable, seems to be more comfortable, able to follow a few simple commands, does not answer questions. Lab reviewed.    Objective:  Filed Vitals:   02/04/15 0900  BP: 117/54  Pulse: 76  Temp:   Resp: 12    Body mass index is 34.95 kg/(m^2).  Intake/Output Summary (Last 24 hours) at 02/04/15 0950 Last data filed at 02/04/15 0800  Gross per 24 hour  Intake 2986.14 ml  Output   1745 ml  Net 1241.14 ml    Sedated, (+) tracheostomy  Sclerae unicteric  No peripheral adenopathy  Lungs clear -- no rales or rhonchi  Heart regular rate and rhythm  Abdomen benign  MSK no focal spinal tenderness  CBG (last 3)   Recent Labs  02/04/15 0058 02/04/15 0351 02/04/15 0758  GLUCAP 241* 268* 143*     Labs:  Lab Results  Component Value Date   WBC 7.2 02/04/2015   HGB 10.3* 02/04/2015   HCT 34.4* 02/04/2015   MCV 101.2* 02/04/2015   PLT 403* 02/04/2015   NEUTROABS 6.6 02/04/2015    @LASTCHEMISTRY @  Urine Studies No results for input(s): UHGB, CRYS in the last 72 hours.  Invalid input(s): UACOL, UAPR, USPG, UPH, UTP, UGL, UKET, UBIL, UNIT, UROB, [place], UEPI, UWBC, [place]wayne Heck [place], [place]  Basic Metabolic Panel:  Recent Labs Lab 01/30/15 0415  01/31/15 0413 02/01/15 0500 02/01/15 1630 02/02/15 0451 02/03/15 0435 02/04/15 0610  NA 145  < > 145 148*  --  147* 144 148*  K 4.3  < > 3.6 3.7  --  4.1 3.3* 4.4  CL 112*  < > 111 112*  --  107 105 112*  CO2 24  --  25 29  --  30 28 30   GLUCOSE 127*  < > 198* 173*  --  216* 203* 179*  BUN 21*  < > 30* 36*  --  40* 53* 86*  CREATININE 0.55  < > 0.52 0.55  --  0.64 0.67 0.77  CALCIUM 9.1  --  9.1 9.5  --  9.9 9.0 9.3  MG 2.1  --  2.2 2.3  --  2.4 2.6*  --   PHOS 3.3  --  2.5 2.4* 2.8 3.0 3.7 5.1*  < > = values in this interval not displayed. GFR Estimated Creatinine Clearance: 72.1 mL/min (by C-G  formula based on Cr of 0.77). Liver Function Tests:  Recent Labs Lab 01/31/15 0413 02/01/15 0500 02/02/15 0451 02/03/15 0435 02/04/15 0610  AST 26 22 34 23 15  ALT 19 20 28 24 20   ALKPHOS 75 81 163* 159* 119  BILITOT 1.2 0.8 1.1 1.1 0.8  PROT 5.3* 5.3* 6.2* 6.0* 5.8*  ALBUMIN 2.0* 2.0* 2.2* 2.3* 2.5*   No results for input(s): LIPASE, AMYLASE in the last 168 hours. No results for input(s): AMMONIA in the last 168 hours. Coagulation profile  Recent Labs Lab 02/02/15 0451  INR 1.33    CBC:  Recent Labs Lab 01/31/15 0413 02/01/15 0500 02/02/15 0451 02/03/15 0435 02/04/15 0610  WBC 6.9 6.6 5.3 9.3 7.2  NEUTROABS  --   --   --  8.0* 6.6  HGB 11.5* 10.5* 10.5* 10.6* 10.3*  HCT 36.6 33.8* 34.0* 32.8* 34.4*  MCV 97.1 97.7 97.1 98.2 101.2*  PLT 251 273 329 337 403*   Cardiac Enzymes: No results for input(s): CKTOTAL, CKMB, CKMBINDEX,  TROPONINI in the last 168 hours. BNP: Invalid input(s): POCBNP CBG:  Recent Labs Lab 02/03/15 1640 02/03/15 1902 02/04/15 0058 02/04/15 0351 02/04/15 0758  GLUCAP 179* 200* 241* 268* 143*   D-Dimer No results for input(s): DDIMER in the last 72 hours. Hgb A1c No results for input(s): HGBA1C in the last 72 hours. Lipid Profile No results for input(s): CHOL, HDL, LDLCALC, TRIG, CHOLHDL, LDLDIRECT in the last 72 hours. Thyroid function studies No results for input(s): TSH, T4TOTAL, T3FREE, THYROIDAB in the last 72 hours.  Invalid input(s): FREET3 Anemia work up No results for input(s): VITAMINB12, FOLATE, FERRITIN, TIBC, IRON, RETICCTPCT in the last 72 hours. Microbiology Recent Results (from the past 240 hour(s))  MRSA PCR Screening     Status: None   Collection Time: 02/10/2015  5:17 PM  Result Value Ref Range Status   MRSA by PCR NEGATIVE NEGATIVE Final    Comment:        The GeneXpert MRSA Assay (FDA approved for NASAL specimens only), is one component of a comprehensive MRSA colonization surveillance program. It is  not intended to diagnose MRSA infection nor to guide or monitor treatment for MRSA infections.   Culture, respiratory (NON-Expectorated)     Status: None   Collection Time: 01/27/15  3:30 PM  Result Value Ref Range Status   Specimen Description TRACHEAL ASPIRATE  Final   Special Requests Normal  Final   Gram Stain   Final    ABUNDANT WBC PRESENT,BOTH PMN AND MONONUCLEAR RARE SQUAMOUS EPITHELIAL CELLS PRESENT NO ORGANISMS SEEN Performed at [facility]    Culture   Final    NO GROWTH 2 DAYS Performed at [facility]    Report Status 01/29/2015 FINAL  Final  C difficile quick scan w PCR reflex     Status: None   Collection Time: 01/29/15  3:45 PM  Result Value Ref Range Status   C Diff antigen NEGATIVE NEGATIVE Final   C Diff toxin NEGATIVE NEGATIVE Final   C Diff interpretation Negative for toxigenic C. difficile  Final  Culture, blood (routine x 2)     Status: None (Preliminary result)   Collection Time: 01/30/15 10:18 PM  Result Value Ref Range Status   Specimen Description BLOOD RIGHT ANTECUBITAL  Final   Special Requests BOTTLES DRAWN AEROBIC AND ANAEROBIC 10CC  Final   Culture NO GROWTH 4 DAYS  Final   Report Status PENDING  Incomplete  Culture, blood (routine x 2)     Status: None (Preliminary result)   Collection Time: 01/30/15 10:28 PM  Result Value Ref Range Status   Specimen Description BLOOD RIGHT HAND  Final   Special Requests BOTTLES DRAWN AEROBIC AND ANAEROBIC 10CC  Final   Culture NO GROWTH 4 DAYS  Final   Report Status PENDING  Incomplete  Culture, respiratory (NON-Expectorated)     Status: None   Collection Time: 01/31/15 12:42 AM  Result Value Ref Range Status   Specimen Description TRACHEAL ASPIRATE  Final   Special Requests Normal  Final   Gram Stain   Final    MODERATE WBC PRESENT,BOTH PMN AND MONONUCLEAR RARE SQUAMOUS EPITHELIAL CELLS PRESENT FEW GRAM NEGATIVE RODS RARE GRAM POSITIVE COCCI IN PAIRS IN CLUSTERS RARE GRAM NEGATIVE  COCCI    Culture   Final    MODERATE STREPTOCOCCUS,BETA HEMOLYTIC NOT GROUP A Performed at [facility]    Report Status 02/02/2015 FINAL  Final      Studies:  Dg Chest Port 1 [address]  02/04/2015  CLINICAL DATA:  69 year old female with respiratory failure and mediastinal mass. EXAM: PORTABLE CHEST 1 VIEW COMPARISON:  02/03/2015 and prior radiograph FINDINGS: Tracheostomy tube, right IJ central venous catheter with tip overlying the lower SVC, and NG tube with tip overlying the proximal -mid stomach again noted. A large left mediastinal mass is again noted. The cardiomediastinal silhouette is unchanged. There is no evidence of pneumothorax. Continued left basilar atelectasis noted. IMPRESSION: Little significant change. Left mediastinal mass with left basilar atelectasis Electronically Signed   By: Margarette Canada M.D.   On: 02/04/2015 07:42   Dg Chest Port 1 View  02/03/2015  CLINICAL DATA:  Respiratory distress. Patient with a tracheostomy. Subsequent encounter. EXAM: PORTABLE CHEST 1 VIEW COMPARISON:  02/02/2015 FINDINGS: Masslike opacity obscures the left hilum and left mediastinum merging with the left heart border. This is without significant change. No convincing pulmonary edema. Probable atelectasis at the left lung base. No pneumothorax or obvious pleural effusion. Tracheostomy tube and right internal jugular central venous line are stable and well positioned. New orogastric tube passes below the diaphragm into the stomach. IMPRESSION: 1. No acute findings in the lungs. 2. Stable masslike opacity along the anterior mediastinum. 3. New orogastric tube is well positioned. Tracheostomy tube and right internal jugular central venous line are stable and also well positioned. Electronically Signed   By: Lajean Manes M.D.   On: 02/03/2015 08:34   Dg Chest Port 1 View  02/02/2015  CLINICAL DATA:  Tracheostomy tube placement. EXAM: PORTABLE CHEST 1 VIEW COMPARISON:  Earlier film 02/02/2015.  FINDINGS: The tracheostomy tube is in good position with the tip at the mid tracheal level. The right IJ catheter is stable. Stable mediastinal adenopathy/mass and persistent left lower lobe infiltrate/atelectasis/effusion. IMPRESSION: Tracheostomy tube in good position without complicating features. Electronically Signed   By: Marijo Sanes M.D.   On: 02/02/2015 12:18    Assessment: 69 y.o.   1. 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 Respiratory failure, s/p tracheostomy  3. Hospital-acquired pneumonia  4. Hyperphosphoremia, trending up UA, mild TLS    Plan: -lab reviewed, giving increase phos and UA, I am concerned about early TLS. I spoke with Dr Halford Chessman, who agrees with IVF, will start NS 34ml/hr today.  -close monitoring I/O: her IVF yesterday was about 2.2L, she is also on tube feeds and oral water, total I/O 3.3/1.6L, +1.L, no signs of fluids overload so far, will monitor I/O closely. If I/O positive >1.5L later today, would consider some lasix. I spoke with her nurse.  -repeat BMP and phos in pm -increase allopurinol to 300mg  bid  -other management per ICU -Dr. Irene Limbo will resume care tomorrow    Truitt Merle, MD 02/04/2015  9:50 AM  [phone]

## 2015-02-04 NOTE — Progress Notes (Signed)
I called Dr. Burr Medico and left message to return call. Pt UO is <959ml since 0700 this morning. BMP and Phos labs have resulted. Pt Tmax is 100.5. Waiting for further orders. Will continue to monitor pt.

## 2015-02-04 NOTE — Plan of Care (Signed)
Problem: Phase II Progression Outcomes Goal: Tolerating prescribed nutrition plan Outcome: Completed/Met Date Met:  02/04/15 Patient at tube feed goal.

## 2015-02-04 NOTE — Progress Notes (Signed)
Name: Addy Irey MRN: [id number] DOB: 10/01/1945    ADMISSION DATE:  02/12/2015  CONSULTATION DATE:  02/20/2015  REFERRING MD :  Dr. Harrington Challenger  CHIEF COMPLAINT:  Pericardial effusion  BRIEF PATIENT DESCRIPTION:  69 yo female with mediastinal mass and pericardial effusion 2nd to Large B cell lymphoma.  SIGNIFICANT EVENTS  11/03 Admit 11/04 To OR for pericardial window and mediastinal mass bx >> remains on vent post-op 11/08 Fever, started Abx 11/09 Off pressors 11/10 Oncology consulted 11/11 [name]); transfer to [facility] for chemo (R-CHOP)  STUDIES:  10/28 CT chest >> 12 cm anterior mediastinal mass, mod pericardial effusion 11/10 Mediastinal mass bx >> diffuse large B-cell lymphoma  SUBJECTIVE:  Remains on sedation.  Tolerated pressure support.  VITAL SIGNS: BP 116/51 mmHg  Pulse 69  Temp(Src) 97.2 F (36.2 C) (Axillary)  Resp 10  Ht 5\' 4"  (1.626 m)  Wt 203 lb 11.3 oz (92.4 kg)  BMI 34.95 kg/m2  SpO2 97% VENT SETTINGS: Vent Mode:  [-] PRVC FiO2 (%):  [45 %-60 %] 45 % Set Rate:  [14 bmp] 14 bmp Vt Set:  [440 mL] 440 mL PEEP:  [5 cmH20] 5 cmH20 Plateau Pressure:  [13 cmH20-23 cmH20] 23 cmH20 INTAKE/OUTPUT: I/O last 3 completed shifts: In: 3785.6 [I.V.:2720.3; NG/GT:1065.3] Out: 2770 [Urine:2570; Stool:200]  PHYSICAL EXAMINATION:  General: sedated HENT: trach site clean PULM: decreased BS on Lt CV: regular GI: soft, non tender MSK: no edema Neuro: RASS -2  CBC Recent Labs     02/02/15  0451  02/03/15  0435  WBC  5.3  9.3  HGB  10.5*  10.6*  HCT  34.0*  32.8*  PLT  329  337    Coag's Recent Labs     02/02/15  0451  INR  1.33    BMET Recent Labs     02/02/15  0451  02/03/15  0435  02/04/15  0610  NA  147*  144  148*  K  4.1  3.3*  4.4  CL  107  105  112*  CO2  30  28  30   BUN  40*  53*  86*  CREATININE  0.64  0.67  0.77  GLUCOSE  216*  203*  179*    Electrolytes Recent Labs     02/02/15  0451  02/03/15  0435  02/04/15  0610    CALCIUM  9.9  9.0  9.3  MG  2.4  2.6*   --   PHOS  3.0  3.7  5.1*    Sepsis Markers Recent Labs     02/02/15  0451  PROCALCITON  2.87    ABG Recent Labs     02/02/15  0412  02/03/15  0320  PHART  7.442  7.396  PCO2ART  42.5  44.2  PO2ART  66.9*  70.3*    Liver Enzymes Recent Labs     02/02/15  0451  02/03/15  0435  02/04/15  0610  AST  34  23  15  ALT  28  24  20   ALKPHOS  163*  159*  119  BILITOT  1.1  1.1  0.8  ALBUMIN  2.2*  2.3*  2.5*    Glucose Recent Labs     02/03/15  1230  02/03/15  1640  02/03/15  1902  02/04/15  0058  02/04/15  0351  02/04/15  0758  GLUCAP  227*  179*  200*  241*  268*  143*    Imaging Dg  Chest Port 1 View  02/04/2015  CLINICAL DATA:  69 year old female with respiratory failure and mediastinal mass. EXAM: PORTABLE CHEST 1 VIEW COMPARISON:  02/03/2015 and prior radiograph FINDINGS: Tracheostomy tube, right IJ central venous catheter with tip overlying the lower SVC, and NG tube with tip overlying the proximal -mid stomach again noted. A large left mediastinal mass is again noted. The cardiomediastinal silhouette is unchanged. There is no evidence of pneumothorax. Continued left basilar atelectasis noted. IMPRESSION: Little significant change. Left mediastinal mass with left basilar atelectasis Electronically Signed   By: Margarette Canada M.D.   On: 02/04/2015 07:42   Dg Chest Port 1 View  02/03/2015  CLINICAL DATA:  Respiratory distress. Patient with a tracheostomy. Subsequent encounter. EXAM: PORTABLE CHEST 1 VIEW COMPARISON:  02/02/2015 FINDINGS: Masslike opacity obscures the left hilum and left mediastinum merging with the left heart border. This is without significant change. No convincing pulmonary edema. Probable atelectasis at the left lung base. No pneumothorax or obvious pleural effusion. Tracheostomy tube and right internal jugular central venous line are stable and well positioned. New orogastric tube passes below the diaphragm  into the stomach. IMPRESSION: 1. No acute findings in the lungs. 2. Stable masslike opacity along the anterior mediastinum. 3. New orogastric tube is well positioned. Tracheostomy tube and right internal jugular central venous line are stable and also well positioned. Electronically Signed   By: Lajean Manes M.D.   On: 02/03/2015 08:34   Dg Chest Port 1 View  02/02/2015  CLINICAL DATA:  Tracheostomy tube placement. EXAM: PORTABLE CHEST 1 VIEW COMPARISON:  Earlier film 02/02/2015. FINDINGS: The tracheostomy tube is in good position with the tip at the mid tracheal level. The right IJ catheter is stable. Stable mediastinal adenopathy/mass and persistent left lower lobe infiltrate/atelectasis/effusion. IMPRESSION: Tracheostomy tube in good position without complicating features. Electronically Signed   By: Marijo Sanes M.D.   On: 02/02/2015 12:18    ASSESSMENT / PLAN:  PULMONARY Trach 11/11 >> A: Acute respiratory failure. Failure to wean from vent s/p trach. P: Pressure support wean to trach collar as tolerated  Change to pressure control as rest mode on 11/13 F/u CXR intermittently  CARDIOVASCULAR: Rt IJ CVL 11/04 >> A: Malignant pericardial effusion s/p window. Hypotension likely from hypovolemia and sedation >> improved. Hx of HLD, HTN. P: Hold outpt lopressor Resume zocor 11/13 Monitor hemodynamics  RENAL A:  Hypokalemia. Hypernatremia. P: Replace electrolytes as needed Continue free water  GASTROENTEROLOGY:  A:  Nutrition. Gastroparesis. P:  Tube feeds Zantac for SUP D/c reglan 11/13  HEMATOLOGY/ONCOLOGY: A: B cell lymphoma. Anemia of critical illness. P: Chemotherapy (R-CHOP) >> using solumedrol in place of prednisone Allopurinol for tumor lysis syndrome prophylaxis F/u CBC Lovenox for DVT prevention  ID:  A: Fever, tracheobronchitis. P: Day 5 of Abx, change to rocephin 11/12  Blood 11/09 >>  Sputum 11/09 >> Moderate Streptococcus, beta  hemolytic  ENDOCRINE: A: Steroid induced hyperglycemia. P: SSI  Increase levemir to 22 on 11/13  NEUROLOGY:  A:  Agitation. P: RASS goal 0  RHEUMATOLOGY: A: Hx of RA >> was on MTX as outpt. P: Will need to re-assess as outpt  CC time 32 minutes  Chesley Mires, MD [place] 02/04/2015, 9:08 AM Pager:  [phone] After 3pm call: [phone]

## 2015-02-04 NOTE — Progress Notes (Signed)
[facility] Progress Note Patient Name: Maria Bauer DOB: [birth year] MRN: [id number]   Date of Service  02/04/2015  HPI/Events of Note  Blood glucose = 236.  eICU Interventions  Will D/C D5W at 30 mL/hour.     Intervention Category Intermediate Interventions: Hyperglycemia - evaluation and treatment  [name] 02/04/2015, 1:54 AM

## 2015-02-04 NOTE — Plan of Care (Signed)
Problem: Nutrition: Goal: Ability to attain and maintain optimal nutritional status will improve Outcome: Progressing Pt tolerating goal rate of tube feed.

## 2015-02-04 NOTE — Progress Notes (Signed)
Dr. Burr Medico returned call. Notified about UO and lab results. No further orders given at this time.

## 2015-02-04 NOTE — Progress Notes (Signed)
[facility] ADULT ICU REPLACEMENT PROTOCOL FOR AM LAB REPLACEMENT ONLY  The patient does apply for the [facility] Adult ICU Electrolyte Replacment Protocol based on the criteria listed below:   1. Is GFR >/= 40 ml/min? Yes.    Patient's GFR today is >60 2. Is urine output >/= 0.5 ml/kg/hr for the last 6 hours? Yes.   Patient's UOP is 0.74 ml/kg/hr 3. Is BUN < 60 mg/dL? Yes.    Patient's BUN today is 53 4. Abnormal electrolyte K 3.3 5. Ordered repletion with: Per protocol 6. If a panic level lab has been reported, has the CCM MD in charge been notified? Yes.  .   Physician:  Illene Labrador, Canary Brim 02/04/2015 4:59 AM

## 2015-02-05 ENCOUNTER — Inpatient Hospital Stay (HOSPITAL_COMMUNITY): Payer: Medicare (Managed Care)

## 2015-02-05 DIAGNOSIS — C8338 Diffuse large B-cell lymphoma, lymph nodes of multiple sites: Secondary | ICD-10-CM

## 2015-02-05 DIAGNOSIS — R0602 Shortness of breath: Secondary | ICD-10-CM

## 2015-02-05 DIAGNOSIS — E870 Hyperosmolality and hypernatremia: Secondary | ICD-10-CM

## 2015-02-05 LAB — GLUCOSE, CAPILLARY
GLUCOSE-CAPILLARY: 210 mg/dL — AB (ref 65–99)
GLUCOSE-CAPILLARY: 215 mg/dL — AB (ref 65–99)
GLUCOSE-CAPILLARY: 221 mg/dL — AB (ref 65–99)
GLUCOSE-CAPILLARY: 260 mg/dL — AB (ref 65–99)
Glucose-Capillary: 236 mg/dL — ABNORMAL HIGH (ref 65–99)
Glucose-Capillary: 252 mg/dL — ABNORMAL HIGH (ref 65–99)

## 2015-02-05 LAB — CBC WITH DIFFERENTIAL/PLATELET
BASOS ABS: 0 10*3/uL (ref 0.0–0.1)
Basophils Relative: 0 %
EOS PCT: 0 %
Eosinophils Absolute: 0 10*3/uL (ref 0.0–0.7)
HCT: 30.6 % — ABNORMAL LOW (ref 36.0–46.0)
Hemoglobin: 9.3 g/dL — ABNORMAL LOW (ref 12.0–15.0)
LYMPHS PCT: 1 %
Lymphs Abs: 0 10*3/uL — ABNORMAL LOW (ref 0.7–4.0)
MCH: 30.5 pg (ref 26.0–34.0)
MCHC: 30.4 g/dL (ref 30.0–36.0)
MCV: 100.3 fL — AB (ref 78.0–100.0)
Monocytes Absolute: 0.1 10*3/uL (ref 0.1–1.0)
Monocytes Relative: 2 %
Neutro Abs: 4.3 10*3/uL (ref 1.7–7.7)
Neutrophils Relative %: 97 %
PLATELETS: 295 10*3/uL (ref 150–400)
RBC: 3.05 MIL/uL — AB (ref 3.87–5.11)
RDW: 15.2 % (ref 11.5–15.5)
WBC: 4.5 10*3/uL (ref 4.0–10.5)

## 2015-02-05 LAB — COMPREHENSIVE METABOLIC PANEL
ALBUMIN: 2.2 g/dL — AB (ref 3.5–5.0)
ALT: 19 U/L (ref 14–54)
AST: 16 U/L (ref 15–41)
Alkaline Phosphatase: 114 U/L (ref 38–126)
Anion gap: 6 (ref 5–15)
BUN: 71 mg/dL — AB (ref 6–20)
CHLORIDE: 117 mmol/L — AB (ref 101–111)
CO2: 32 mmol/L (ref 22–32)
CREATININE: 0.55 mg/dL (ref 0.44–1.00)
Calcium: 9 mg/dL (ref 8.9–10.3)
GFR calc Af Amer: >60 mL/min (ref >60)
GLUCOSE: 260 mg/dL — AB (ref 65–99)
Potassium: 4.5 mmol/L (ref 3.5–5.1)
SODIUM: 155 mmol/L — AB (ref 135–145)
Total Bilirubin: 1.3 mg/dL — ABNORMAL HIGH (ref 0.3–1.2)
Total Protein: 5.2 g/dL — ABNORMAL LOW (ref 6.5–8.1)

## 2015-02-05 LAB — PHOSPHORUS: Phosphorus: 3 mg/dL (ref 2.5–4.6)

## 2015-02-05 LAB — MAGNESIUM: Magnesium: 2.9 mg/dL — ABNORMAL HIGH (ref 1.7–2.4)

## 2015-02-05 LAB — LACTATE DEHYDROGENASE: LDH: 582 U/L — ABNORMAL HIGH (ref 98–192)

## 2015-02-05 LAB — URIC ACID: Uric Acid, Serum: 3.5 mg/dL (ref 2.3–6.6)

## 2015-02-05 MED ORDER — FAMOTIDINE IN NACL 20-0.9 MG/50ML-% IV SOLN: 20.0000 mg | Freq: Once | INTRAVENOUS | Status: DC | PRN

## 2015-02-05 MED ORDER — DIPHENHYDRAMINE HCL 50 MG/ML IJ SOLN: 50.0000 mg | Freq: Once | INTRAMUSCULAR | Status: DC | PRN

## 2015-02-05 MED ORDER — EPINEPHRINE HCL 0.1 MG/ML IJ SOSY
0.2500 mg | PREFILLED_SYRINGE | Freq: Once | INTRAMUSCULAR | Status: DC | PRN
Start: 2015-02-05 — End: 2015-02-09

## 2015-02-05 MED ORDER — EPINEPHRINE HCL 0.1 MG/ML IJ SOSY: 0.2500 mg | PREFILLED_SYRINGE | Freq: Once | INTRAMUSCULAR | Status: DC | PRN

## 2015-02-05 MED ORDER — SODIUM CHLORIDE 0.9 % IV SOLN: Freq: Once | INTRAVENOUS | Status: DC

## 2015-02-05 MED ORDER — ACETAMINOPHEN 325 MG PO TABS
650.0000 mg | ORAL_TABLET | Freq: Once | ORAL | Status: AC
  Administered 2015-02-05: 650 mg via ORAL
  Filled 2015-02-05: qty 2

## 2015-02-05 MED ORDER — METOPROLOL TARTRATE 12.5 MG HALF TABLET
12.5000 mg | ORAL_TABLET | Freq: Two times a day (BID) | ORAL | Status: DC
Start: 2015-02-05 — End: 2015-02-07
  Administered 2015-02-05 – 2015-02-06 (×4): 12.5 mg via ORAL
  Filled 2015-02-05 (×4): qty 1

## 2015-02-05 MED ORDER — DEXTROSE-NACL 5-0.45 % IV SOLN
INTRAVENOUS | Status: DC
  Administered 2015-02-05: 1000 mL via INTRAVENOUS
  Administered 2015-02-06 – 2015-02-07 (×2): via INTRAVENOUS

## 2015-02-05 MED ORDER — INSULIN DETEMIR 100 UNIT/ML ~~LOC~~ SOLN
25.0000 [IU] | Freq: Every day | SUBCUTANEOUS | Status: DC
  Administered 2015-02-05: 25 [IU] via SUBCUTANEOUS
  Filled 2015-02-05 (×2): qty 0.25

## 2015-02-05 MED ORDER — RITUXIMAB CHEMO INJECTION 500 MG/50ML
375.0000 mg/m2 | Freq: Once | INTRAVENOUS | Status: AC
  Administered 2015-02-05: 800 mg via INTRAVENOUS
  Filled 2015-02-05: qty 50

## 2015-02-05 MED ORDER — ALTEPLASE 2 MG IJ SOLR: 2.0000 mg | Freq: Once | INTRAMUSCULAR | Status: DC | PRN

## 2015-02-05 MED ORDER — EPINEPHRINE HCL 1 MG/ML IJ SOLN
0.5000 mg | Freq: Once | INTRAMUSCULAR | Status: DC | PRN
Start: 2015-02-05 — End: 2015-02-09

## 2015-02-05 MED ORDER — SODIUM CHLORIDE 0.9 % IJ SOLN: 10.0000 mL | INTRAMUSCULAR | Status: DC | PRN

## 2015-02-05 MED ORDER — ALBUTEROL SULFATE (2.5 MG/3ML) 0.083% IN NEBU: 2.5000 mg | INHALATION_SOLUTION | Freq: Once | RESPIRATORY_TRACT | Status: DC | PRN

## 2015-02-05 MED ORDER — METHYLPREDNISOLONE SODIUM SUCC 125 MG IJ SOLR: 125.0000 mg | Freq: Once | INTRAMUSCULAR | Status: DC | PRN

## 2015-02-05 MED ORDER — DIPHENHYDRAMINE HCL 50 MG/ML IJ SOLN
50.0000 mg | Freq: Once | INTRAMUSCULAR | Status: AC
Start: 2015-02-05 — End: 2015-02-05
  Administered 2015-02-05: 50 mg via INTRAVENOUS
  Filled 2015-02-05: qty 1

## 2015-02-05 MED ORDER — DIPHENHYDRAMINE HCL 50 MG/ML IJ SOLN: 25.0000 mg | Freq: Once | INTRAMUSCULAR | Status: DC | PRN

## 2015-02-05 MED ORDER — LORAZEPAM 2 MG/ML IJ SOLN
0.5000 mg | INTRAMUSCULAR | Status: DC | PRN
  Administered 2015-02-05 – 2015-02-06 (×4): 0.5 mg via INTRAVENOUS
  Filled 2015-02-05 (×4): qty 1

## 2015-02-05 MED ORDER — SODIUM CHLORIDE 0.9 % IJ SOLN: 3.0000 mL | INTRAMUSCULAR | Status: DC | PRN

## 2015-02-05 MED ORDER — HEPARIN SOD (PORK) LOCK FLUSH 100 UNIT/ML IV SOLN
500.0000 [IU] | Freq: Once | INTRAVENOUS | Status: DC | PRN
  Filled 2015-02-05: qty 5

## 2015-02-05 MED ORDER — HEPARIN SOD (PORK) LOCK FLUSH 100 UNIT/ML IV SOLN: 250.0000 [IU] | Freq: Once | INTRAVENOUS | Status: DC | PRN

## 2015-02-05 MED ORDER — DEXTROSE-NACL 5-0.9 % IV SOLN
INTRAVENOUS | Status: DC
Start: 2015-02-05 — End: 2015-02-05
  Administered 2015-02-05: 09:00:00 via INTRAVENOUS

## 2015-02-05 MED ORDER — SODIUM CHLORIDE 0.9 % IV SOLN
Freq: Once | INTRAVENOUS | Status: AC | PRN
  Administered 2015-02-05: 10:00:00 via INTRAVENOUS

## 2015-02-05 MED ORDER — EPINEPHRINE HCL 1 MG/ML IJ SOLN: 0.5000 mg | Freq: Once | INTRAMUSCULAR | Status: DC | PRN

## 2015-02-05 NOTE — Progress Notes (Signed)
Date: February 05, 2015 Chart reviewed for concurrent status and case management needs. Will continue to follow patient for changes and needs: Remains on vent support Velva Harman, RN, BSN, [place]   [phone]

## 2015-02-05 NOTE — Progress Notes (Signed)
RT attempted to SBT patient on 8/+5 PSV and patient became labored, anxious, and upset. Patient acknowledge that she was scared and RT talked to patient while she was on PSV mode. Patient volumes and RR were well in acceptable range RR 15 Vt of 550-650 mL, but patient distressed. Patient was returned to full support per previous settings and patient became more calm. RN aware and at bedside. RT will continue to monitor and attempt a second SBT late morning/early afternoon as tolerated by patient.

## 2015-02-05 NOTE — Progress Notes (Signed)
Name: Kyriah Huibregtse MRN: [id number] DOB: 1945-04-29    ADMISSION DATE:  02/12/2015  CONSULTATION DATE:  01/31/2015  REFERRING MD :  Dr. 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  SIGNIFICANT EVENTS  11/03 Admit 11/04 To OR for pericardial window and mediastinal mass bx >> remains on vent post-op 11/08 Fever, started Abx 11/09 Off pressors 11/10 Oncology consulted 11/11 [name]); transfer to [facility] for chemo (R-CHOP) 11/13  Remains on sedation.  Tolerated pressure support.  STUDIES:  10/28 CT chest >> 12 cm anterior mediastinal mass, mod pericardial effusion 11/10 Mediastinal mass bx >> diffuse large B-cell lymphoma   SUBJECTIVE:  Episode of SVT with suctioning overnight.  Did not tolerated PSV this am due to anxiety.    VITAL SIGNS: BP 172/65 mmHg  Pulse 88  Temp(Src) 98.2 F (36.8 C) (Axillary)  Resp 15  Ht 5\' 4"  (1.626 m)  Wt 212 lb 8.4 oz (96.4 kg)  BMI 36.46 kg/m2  SpO2 97%   VENT SETTINGS: Vent Mode:  [-] PCV FiO2 (%):  [40 %-45 %] 40 % Set Rate:  [12 bmp] 12 bmp PEEP:  [5 cmH20] 5 cmH20 Pressure Support:  [8 cmH20-10 cmH20] 8 cmH20 Plateau Pressure:  [18 cmH20-19 cmH20] 19 cmH20   INTAKE/OUTPUT: I/O last 3 completed shifts: In: 5234.9 [I.V.:2844.9; NG/GT:2290; IV Piggyback:100] Out: [id number] [Urine:3760; Stool:125]  PHYSICAL EXAMINATION:  General: elderly female in NAD on vent HENT: trach site clean, sutures intact  PULM: even/non-labored, decreased BS on L CV: s1s2 rrr, no m/r/g  GI: soft, non tender MSK: trace generalized edema Neuro: RASS -1  CBC Recent Labs     02/03/15  0435  02/04/15  0610  02/05/15  0448  WBC  9.3  7.2  4.5  HGB  10.6*  10.3*  9.3*  HCT  32.8*  34.4*  30.6*  PLT  337  403*  295   Coag's No results for input(s): APTT, INR in the last 72 hours.  BMET Recent Labs     02/04/15  0610  02/04/15  1500  02/05/15  0448  NA  148*  150*  155*  K  4.4  4.4  4.5  CL  112*  113*  117*  CO2  30  29  32  BUN  86*  89*  71*  CREATININE  0.77  0.70  0.55  GLUCOSE  179*  239*  260*    Electrolytes Recent Labs     02/03/15  0435  02/04/15  0610  02/04/15  1500  02/05/15  0448  CALCIUM  9.0  9.3  9.2  9.0  MG  2.6*   --    --   2.9*  PHOS  3.7  5.1*  3.8  3.0   Sepsis Markers No results for input(s): PROCALCITON, O2SATVEN in the last 72 hours.  Invalid input(s): LACTICACIDVEN  ABG Recent Labs     02/03/15  0320  PHART  7.396  PCO2ART  44.2  PO2ART  70.3*   Liver Enzymes Recent Labs     02/03/15  0435  02/04/15  0610  02/05/15  0448  AST  23  15  16   ALT  24  20  19   ALKPHOS  159*  119  114  BILITOT  1.1  0.8  1.3*  ALBUMIN  2.3*  2.5*  2.2*   Glucose Recent Labs  02/04/15  1151  02/04/15  1634  02/04/15  1939  02/05/15  0019  02/05/15  0412  02/05/15  0752  GLUCAP  204*  182*  199*  260*  210*  236*    Imaging Dg Chest Port 1 View  02/05/2015  CLINICAL DATA:  Respiratory failure. Mediastinal mass with pericardial effusion. EXAM: PORTABLE CHEST 1 VIEW COMPARISON:  Radiographs dated 02/03/2015 and 02/04/2015 FINDINGS: Central line, NG tube, and tracheostomy tube appear in good position, unchanged. There is new slight pulmonary vascular congestion. No change in slight atelectasis in left lung. Mediastinal mass is obscured due to rotation. Right lung is clear of infiltrates and atelectasis. IMPRESSION: New slight pulmonary vascular prominence. Slight atelectasis in the left midzone, stable. Electronically Signed   By: Lorriane Shire M.D.   On: 02/05/2015 07:24   Dg Chest Port 1 View  02/04/2015  CLINICAL DATA:  69 year old female with respiratory failure and mediastinal mass. EXAM: PORTABLE CHEST 1 VIEW COMPARISON:  02/03/2015 and prior radiograph FINDINGS: Tracheostomy tube, right IJ central venous catheter with tip overlying the lower SVC, and NG tube with  tip overlying the proximal -mid stomach again noted. A large left mediastinal mass is again noted. The cardiomediastinal silhouette is unchanged. There is no evidence of pneumothorax. Continued left basilar atelectasis noted. IMPRESSION: Little significant change. Left mediastinal mass with left basilar atelectasis Electronically Signed   By: Margarette Canada M.D.   On: 02/04/2015 07:42    ASSESSMENT / PLAN:  PULMONARY Trach 11/11 >> A: Acute respiratory failure. Failure to wean from vent s/p trach. P: Pressure support wean to trach collar as tolerated  Change to pressure control as rest mode on 11/13 F/u CXR intermittently Remove sutures 11/18  CARDIOVASCULAR: Rt IJ CVL 11/04 >> A: Malignant pericardial effusion s/p window. Hypotension likely from hypovolemia and sedation >> improved. Hx of HLD, HTN. P: Resume outpatient lopressor at reduced dose  Resume zocor 11/13 Monitor hemodynamics  RENAL A:  Hypokalemia. Hypernatremia. P: Replace electrolytes as needed Continue free water Change IVF to DNS @ 75 ml/hr  GASTROENTEROLOGY:  A:  Nutrition. Gastroparesis. P:  Tube feeds Zantac for SUP D/c reglan 11/13  HEMATOLOGY/ONCOLOGY: A: B cell lymphoma. Anemia of critical illness. P: Chemotherapy (R-CHOP) >> using solumedrol in place of prednisone Allopurinol for tumor lysis syndrome prophylaxis F/u CBC Lovenox for DVT prevention  ID:  A: Fever, tracheobronchitis. P: Day 6 of Abx, changed to rocephin 11/12  Blood 11/09 >> neg Sputum 11/09 >> Moderate Streptococcus, beta hemolytic  ENDOCRINE: A: Steroid induced hyperglycemia. P: SSI  Increase levemir to 25 on 11/14  NEUROLOGY:  A:  Agitation. Anxiety P: RASS goal 0 Resume PRN ativan Attempt to wean precedex to off   RHEUMATOLOGY: A: Hx of RA >> was on MTX as outpt. P: Will need to re-assess as outpt    Noe Gens, NP-C [facility] Pgr: [phone] or if no answer  [phone] 02/05/2015, 8:44 AM

## 2015-02-05 NOTE — Progress Notes (Signed)
RT attempted to wean patient on ventilator while in chair. Patient wean terminated due to increased HR to 150, increased WOB, and anxiety. Patient acknowledged chest pain and feelings of not being able to breath. Patient returned to full support. RN at bedside with RT. RT will continue to monitor patient.

## 2015-02-05 NOTE — Progress Notes (Addendum)
1415 Rituximab infusion initiated per protocol at 1m/hr.  Secondary bedside verification completed by JDrue Dun RN.  Hypersensitivity kit at bedside.  Will be at bedside for continuous monitoring during infusion. 1445 - Infusion increased to 10108mhr - VSS - no sign of reaction. 1515 - Infusion increased to 15035mr - VSS - no sign of reaction. 1545 - Infusion increased to 200m47m - VSS - no sign of reaction. 1615 - Infusion increased to 250mg14m- VSS - no sign of reaction. 1645 - infusion increased to 300mg/26m VSS - no sign of reaction.  1715 - Infusion increased to 350mg/h22mVSS - no sign of reaction.  1745 - Infusion increased to 400mg/hr88mal titration - VSS - no sign of reaction. Infusion completed at 1810.  Central line flushed with NS.  Patient resting quietly.  VSS.  No sign of reaction.  Family at bedside.

## 2015-02-05 NOTE — Progress Notes (Signed)
Initial Nutrition Assessment  DOCUMENTATION CODES:   Obesity unspecified  INTERVENTION:   Recommend advancing Vital HP @ 40 ml/hr by 10 ml every 4 hours to goal rate of 55 ml/hr.  Tube feeding at goal provides 1320 kcal, 116 g of protein and 1104 ml free water.  RD to continue to monitor  NUTRITION DIAGNOSIS:   Inadequate oral intake related to inability to eat as evidenced by NPO status.  Ongoing.  GOAL:   Provide needs based on ASPEN/SCCM guidelines  Not meeting, tube feeding not at recommended goal rate.  MONITOR:   Vent status, Labs, Weight trends, TF tolerance, Skin, I & O's  REASON FOR ASSESSMENT:   Ventilator, Other (Comment) (Transfer from [facility])    ASSESSMENT:   69 yo Female with PMH of scoliosis, HTN, anxiety; admitted with pericardial effusion, mediastinal mass. 11/11: Transferred to [facility] from [facility] for chemotherapy  Patient in chair with RN in room, pt unable to provide much history d/t trach. Per RN, pt is tolerating TF at 40 ml/hr. TF recommendations provided above. RD unable to adjust order d/t lack of consult.  Patient is currently intubated on ventilator support MV: 9.3 L/min Temp (24hrs), Avg:99.3 F (37.4 C), Min:97.7 F (36.5 C), Max:100.8 F (38.2 C)  Propofol: none  Labs reviewed: CBGs: 210-160 Elevated Na, BUN, Mg Phos WNL  Diet Order:     Skin:  Reviewed, no issues  Last BM:  11/13  Height:   Ht Readings from Last 1 Encounters:  02/03/2015 5\' 4"  (1.626 m)    Weight:   Wt Readings from Last 1 Encounters:  02/04/15 212 lb 8.4 oz (96.4 kg)    Ideal Body Weight:  54.5 kg  BMI:  Body mass index is 36.46 kg/(m^2).  Estimated Nutritional Needs:   Kcal:  [id number]  Protein:  105-115g  Fluid:  1.6L/day  EDUCATION NEEDS:   No education needs identified at this time  Clayton Bibles, MS, RD, LDN Pager: [phone] After Hours Pager: [phone]

## 2015-02-05 NOTE — Progress Notes (Signed)
At about 2126, while performing endotracheal suctioning on patient, patient's heart rate increased to 150s and 180s. The rhythm was irregular on the monitor but before 12-lead EKG could be completed pt converted back to sinus rhythm, with HR in 70s. This episode lasted less than 49minutes.

## 2015-02-05 NOTE — Progress Notes (Signed)
Maria Bauer   HEMATOLOGY/ONCOLOGY INPATIENT PROGRESS NOTE  Date of Service: 02/05/2015  Inpatient Attending: .Chesley Mires, MD   SUBJECTIVE  [name] at noon today. She is sitting in a recliner. Appears comfortable. No acute distress. Still on a vent is status post tracheostomy. Tolerated CHOP without any overt concerns. Rituxan was not given over the weekend due to staffing issues to allow for adequate monitoring. Discussed with pharmacy today about given Rituxan today. Appreciate excellent critical care support by the pulmonary and critical care team. No family noted at bedside.      OBJECTIVE:  PHYSICAL EXAMINATION: . Filed Vitals:   02/05/15 1000 02/05/15 1100 02/05/15 1127 02/05/15 1200  BP: 146/101 126/83 126/83 178/70  Pulse: 76 99 95   Temp: 97.9 F (36.6 C) 98.2 F (36.8 C)  98.2 F (36.8 C)  TempSrc:    Core (Comment)  Resp: 12 13 16 13   Height:      Weight:      SpO2: 97% 96% 94%    Filed Weights   02/02/15 0500 02/03/15 0319 02/04/15 1600  Weight: 209 lb 14.1 oz (95.2 kg) 203 lb 11.3 oz (92.4 kg) 212 lb 8.4 oz (96.4 kg)   .Body mass index is 36.46 kg/(m^2).   GENERAL acute distress, status post trach sitting in a recliner still no ventilator SKIN: skin color, texture, turgor are normal, no rashes or significant lesions EYES: normal, conjunctiva are pink and non-injected, sclera clear OROPHARYNX:no exudate, no erythema and lips, buccal mucosa NECK: supple, thyroid normal size, non-tender, without nodularity LYMPH: no palpable lymphadenopathy in the cervical, axillary or inguinal area LUNGS: Distant breath , decreased air entry LL>>Rt HEART: regular rate & rhythm and no murmurs and trace pedal edema ABDOMEN:soft, non-tender and normal bowel sounds, no hepatosplenomegaly. NEURO:sedated and ventilated   MEDICAL HISTORY:  Past Medical History  Diagnosis Date  . High cholesterol   . Hypertension   . Acid reflux   . Insomnia   . Scoliosis   . Asthma    "stopped in the 1990's"  . Anemia   . Peptic ulcer   . Daily headache   . Rheumatoid arthritis ([place])     "everywhere" (02/05/2015)  . Chronic lower back pain     "mostly on the right" (02/01/2015)  . Anxiety   . Melanoma ([place]) 11/2011    "left forearm"    SURGICAL HISTORY: Past Surgical History  Procedure Laterality Date  . Appendectomy    . Total knee arthroplasty Bilateral   . Joint replacement    . Total abdominal hysterectomy    . Melanoma excision Left 11/2011    "forearm"  . Lymph node biopsy      "around the back of neck"  . Axillary lymph node biopsy Left   . Inguinal lymph node biopsy Left   . Pericardial window N/A 02/06/2015    Procedure: PERICARDIAL WINDOW;  Surgeon: Ivin Poot, MD;  Location: [place];  Service: Thoracic;  Laterality: N/A;  . Mediastinotomy chamberlain mcneil Left 02/17/2015    Procedure: MEDIASTINOTOMY CHAMBERLAIN MCNEIL;  Surgeon: Ivin Poot, MD;  Location: [facility] OR;  Service: Thoracic;  Laterality: Left;    SOCIAL HISTORY: Social History   Social History  . Marital Status: Married    Spouse Name: N/A  . Number of Children: N/A  . Years of Education: N/A   Occupational History  . Not on file.   Social History Main Topics  . Smoking status: Never Smoker   . Smokeless tobacco:  Never Used  . Alcohol Use: No  . Drug Use: No  . Sexual Activity: Not on file   Other Topics Concern  . Not on file   Social History Narrative    FAMILY HISTORY: Family History  Problem Relation Age of Onset  . Cancer Sister 52    BREAST    ALLERGIES:  is allergic to morphine and related and norco.  MEDICATIONS:  Scheduled Meds: . sodium chloride   Intravenous Once  . allopurinol  300 mg Per Tube BID  . antiseptic oral rinse  7 mL Mouth Rinse QID  . bisacodyl  10 mg Oral Daily  . cefTRIAXone (ROCEPHIN)  IV  1 g Intravenous Q24H  . chlorhexidine gluconate  15 mL Mouth Rinse BID  . enoxaparin (LOVENOX) injection  40 mg Subcutaneous Daily  .  feeding supplement (PRO-STAT SUGAR FREE 64)  60 mL Per Tube TID  . feeding supplement (VITAL HIGH PROTEIN)  1,000 mL Per Tube Q24H  . free water  250 mL Per Tube Q6H  . insulin aspart  0-20 Units Subcutaneous 6 times per day  . insulin detemir  25 Units Subcutaneous Daily  . methylPREDNISolone (SOLU-MEDROL) injection  125 mg Intravenous Q12H  . metoprolol tartrate  12.5 mg Oral BID  . ranitidine  150 mg Oral BID  . riTUXimab (RITUXAN) IV infusion  375 mg/m2 (Treatment Plan Actual) Intravenous Once  . senna-docusate  1 tablet Oral QHS   Continuous Infusions: . dexmedetomidine Stopped (02/05/15 0715)  . dextrose 5 % and 0.45% NaCl    . fentaNYL infusion INTRAVENOUS 25 mcg/hr (02/05/15 1200)   PRN Meds:.alteplase, fentaNYL (SUBLIMAZE) injection, heparin lock flush, heparin lock flush, LORazepam, ondansetron (ZOFRAN) IV, sodium chloride, sodium chloride  REVIEW OF SYSTEMS:    10 Point review of Systems was done is negative except as noted above.   LABORATORY DATA:  I have reviewed the data as listed  . CBC Latest Ref Rng 02/05/2015 02/04/2015 02/03/2015  WBC 4.0 - 10.5 K/uL 4.5 7.2 9.3  Hemoglobin 12.0 - 15.0 g/dL 9.3(L) 10.3(L) 10.6(L)  Hematocrit 36.0 - 46.0 % 30.6(L) 34.4(L) 32.8(L)  Platelets 150 - 400 K/uL 295 403(H) 337    . CMP Latest Ref Rng 02/05/2015 02/04/2015 02/04/2015  Glucose 65 - 99 mg/dL 260(H) 239(H) 179(H)  BUN 6 - 20 mg/dL 71(H) 89(H) 86(H)  Creatinine 0.44 - 1.00 mg/dL 0.55 0.70 0.77  Sodium 135 - 145 mmol/L 155(H) 150(H) 148(H)  Potassium 3.5 - 5.1 mmol/L 4.5 4.4 4.4  Chloride 101 - 111 mmol/L 117(H) 113(H) 112(H)  CO2 22 - 32 mmol/L 32 29 30  Calcium 8.9 - 10.3 mg/dL 9.0 9.2 9.3  Total Protein 6.5 - 8.1 g/dL 5.2(L) - 5.8(L)  Total Bilirubin 0.3 - 1.2 mg/dL 1.3(H) - 0.8  Alkaline Phos 38 - 126 U/L 114 - 119  AST 15 - 41 U/L 16 - 15  ALT 14 - 54 U/L 19 - 20     RADIOGRAPHIC STUDIES: I have personally reviewed the radiological images as listed and  agreed with the findings in the report. Ct Abdomen Pelvis Wo Contrast  02/01/2015  CLINICAL DATA:  Diffuse large B-cell lymphoma undergoing chemotherapy. EXAM: CT ABDOMEN AND PELVIS WITHOUT CONTRAST TECHNIQUE: Multidetector CT imaging of the abdomen and pelvis was performed following the standard protocol without IV contrast. COMPARISON:  Recent chest CT 01/19/2015 FINDINGS: Examination is degraded by motion. Lower chest: There is air in the left chest wall likely from recent biopsy/surgery. The left-sided chest  tube is in place. Extensive left lower lobe airspace consolidation which could reflect atelectasis or pneumonia. Small bilateral pleural effusions. Right basilar atelectasis. There is an NG tube coursing down the esophagus and into the stomach. Bulky mediastinal lymphadenopathy again demonstrated. Coronary artery calcifications are noted. Hepatobiliary: No focal hepatic lesions except for a small cyst in the right hepatic lobe inferiorly. No evidence of metastatic disease without IV contrast. The gallbladder is grossly normal. No common bile duct dilatation. Pancreas: No mass, inflammation or ductal dilatation. Spleen: Normal size.  No definite lesions. Adrenals/Urinary Tract: The adrenal glands are normal. Both kidneys are normal except for a right renal calculus. No hydronephrosis. Stomach/Bowel: The stomach contains an NG tube. The duodenum, small bowel and colon are unremarkable. No inflammatory changes, mass lesions or obstructive findings. A rectal catheter is in place. Vascular/Lymphatic: No mesenteric or retroperitoneal mass or adenopathy. Small scattered lymph nodes are noted. There are moderate to advanced aortic calcifications but no focal aneurysm. Other: No pelvic mass or adenopathy. No free pelvic fluid collections. A Foley catheter is noted in the bladder. The uterus is surgically absent. No inguinal mass or adenopathy. Musculoskeletal: No significant bony findings. Degenerative changes  involving the spine with severe facet disease. There is a remote appearing compression fracture of L1. IMPRESSION: 1. Left-sided chest tube in place with a small left pleural effusion. Significant left lower lobe airspace consolidation suggesting pneumonia or dense atelectasis. 2. Bulky mediastinal lymphadenopathy. 3. No adenopathy in the abdomen or pelvis. 4. No acute abdominal/pelvic findings. Electronically Signed   By: Marijo Sanes M.D.   On: 02/01/2015 21:33   Dg Chest 2 View  01/17/2015  CLINICAL DATA:  Cough and shortness of breath for the past several days EXAM: CHEST  2 VIEW COMPARISON:  None in PACs FINDINGS: There is an abnormal soft tissue mass arising from the left hilar and suprahilar regions adjacent to the AP window. The aortic knob remains reasonably well demonstrated. The cardiac silhouette is enlarged. The pulmonary vascularity is not engorged. The lungs are well-expanded. There is no focal infiltrate nor significant pleural effusion. There is moderate dextrocurvature of the thoracic spine centered at approximately T8. IMPRESSION: Large abnormal anterior mediastinal mass. This may be neoplastic or vascular in nature. CT scanning of the chest now is recommended. These results will be called to the ordering clinician or representative by the Radiologist Assistant, and communication documented in the PACS or zVision Dashboard. Electronically Signed   By: David  Martinique M.D.   On: 01/17/2015 11:15   Ct Chest W Contrast  01/19/2015  CLINICAL DATA:  Cough, shortness of breath, possible chest mass. EXAM: CT CHEST WITH CONTRAST TECHNIQUE: Multidetector CT imaging of the chest was performed during intravenous contrast administration. CONTRAST:  21mL ISOVUE-300 IOPAMIDOL (ISOVUE-300) INJECTION 61% COMPARISON:  January 17, 2015. FINDINGS: No pneumothorax or pleural effusion is noted. 5 mm nodule is noted posteriorly in the right lower lobe best seen on image number 27 of series 4. Atherosclerosis of  thoracic aorta is noted without aneurysm or dissection. Great vessels are widely patent. Mild coronary artery calcifications are noted. Moderate pericardial effusion is noted. Visualized portion of upper abdomen is unremarkable. Abnormal soft tissue density is noted in the anterior mediastinum that measures 12.0 x 8.8 cm. This is noted to compress a portion of the left innominate vein. This is most consistent with adenopathy consistent with lymphoma or metastatic disease. No significant osseous abnormality is noted. IMPRESSION: 12.0 x 8.8 cm abnormal soft tissue density seen in  anterior mediastinum concerning for adenopathy related to lymphoma or other metastatic disease. Some compression of the left innominate vein is noted as a result. Moderate pericardial effusion is noted as well. 5 mm nodule is noted posteriorly in the right lower lobe. This may represent metastatic focus given above finding. Alternatively, it may be unrelated. Continued CT follow-up is recommended to ensure stability. These results will be called to the ordering clinician or representative by the Radiologist Assistant, and communication documented in the PACS or zVision Dashboard. Electronically Signed   By: Marijo Conception, M.D.   On: 01/19/2015 12:07   Dg Chest Port 1 View  02/05/2015  CLINICAL DATA:  Respiratory failure. Mediastinal mass with pericardial effusion. EXAM: PORTABLE CHEST 1 VIEW COMPARISON:  Radiographs dated 02/03/2015 and 02/04/2015 FINDINGS: Central line, NG tube, and tracheostomy tube appear in good position, unchanged. There is new slight pulmonary vascular congestion. No change in slight atelectasis in left lung. Mediastinal mass is obscured due to rotation. Right lung is clear of infiltrates and atelectasis. IMPRESSION: New slight pulmonary vascular prominence. Slight atelectasis in the left midzone, stable. Electronically Signed   By: Lorriane Shire M.D.   On: 02/05/2015 07:24   Dg Chest Port 1 View  02/04/2015   CLINICAL DATA:  69 year old female with respiratory failure and mediastinal mass. EXAM: PORTABLE CHEST 1 VIEW COMPARISON:  02/03/2015 and prior radiograph FINDINGS: Tracheostomy tube, right IJ central venous catheter with tip overlying the lower SVC, and NG tube with tip overlying the proximal -mid stomach again noted. A large left mediastinal mass is again noted. The cardiomediastinal silhouette is unchanged. There is no evidence of pneumothorax. Continued left basilar atelectasis noted. IMPRESSION: Little significant change. Left mediastinal mass with left basilar atelectasis Electronically Signed   By: Margarette Canada M.D.   On: 02/04/2015 07:42   Dg Chest Port 1 View  02/03/2015  CLINICAL DATA:  Respiratory distress. Patient with a tracheostomy. Subsequent encounter. EXAM: PORTABLE CHEST 1 VIEW COMPARISON:  02/02/2015 FINDINGS: Masslike opacity obscures the left hilum and left mediastinum merging with the left heart border. This is without significant change. No convincing pulmonary edema. Probable atelectasis at the left lung base. No pneumothorax or obvious pleural effusion. Tracheostomy tube and right internal jugular central venous line are stable and well positioned. New orogastric tube passes below the diaphragm into the stomach. IMPRESSION: 1. No acute findings in the lungs. 2. Stable masslike opacity along the anterior mediastinum. 3. New orogastric tube is well positioned. Tracheostomy tube and right internal jugular central venous line are stable and also well positioned. Electronically Signed   By: Lajean Manes M.D.   On: 02/03/2015 08:34   Dg Chest Port 1 View  02/02/2015  CLINICAL DATA:  Tracheostomy tube placement. EXAM: PORTABLE CHEST 1 VIEW COMPARISON:  Earlier film 02/02/2015. FINDINGS: The tracheostomy tube is in good position with the tip at the mid tracheal level. The right IJ catheter is stable. Stable mediastinal adenopathy/mass and persistent left lower lobe  infiltrate/atelectasis/effusion. IMPRESSION: Tracheostomy tube in good position without complicating features. Electronically Signed   By: Marijo Sanes M.D.   On: 02/02/2015 12:18   Dg Chest Port 1 View  02/02/2015  CLINICAL DATA:  Shortness of breath. EXAM: PORTABLE CHEST 1 VIEW COMPARISON:  02/01/2015 FINDINGS: Examination is partially limited by leftward patient rotation. Endotracheal tube terminates above the level of the thoracic inlet and has likely been slightly retracted in the interim. Enteric tube courses towards the left upper abdomen with tip not imaged.  Right jugular central venous catheter remains, projecting over the SVC. Known, large mediastinal mass is again seen. Cardiac silhouette remains enlarged. Left chest tube remains in place. No pneumothorax is identified. Left basilar opacities have slightly improved. Mild right basilar opacity is unchanged may reflect atelectasis. IMPRESSION: 1. Mild interval retraction of the endotracheal tube which projects above the thoracic inlet. Consider advancement. 2. Minimally improved aeration of the left lung base. No pneumothorax. Electronically Signed   By: Logan Bores M.D.   On: 02/02/2015 07:18   Dg Chest Port 1 View  02/01/2015  CLINICAL DATA:  Pericardial window.  Pericardial effusion. EXAM: PORTABLE CHEST 1 VIEW COMPARISON:  01/31/2015 FINDINGS: Endotracheal tube in good position. NG tube in place with the tip not visualized due to underpenetration. Right jugular central venous catheter tip in the SVC. No pneumothorax on the right Interval improvement in aeration of the left lung with decrease in density left lower lobe. This may be due to decreased diffusion atelectasis. Left chest tube remains in place. No pneumothorax on the left. Cardiac enlargement has improved. Right lower lobe atelectasis is present. IMPRESSION: Endotracheal tube in good position. Decreased left lower lobe density with improved aeration of the left lung. Left chest tube  in place without pneumothorax. Electronically Signed   By: Franchot Gallo M.D.   On: 02/01/2015 07:16   Dg Chest Port 1 View  01/31/2015  CLINICAL DATA:  Status post median sternotomy and pericardial window creation for pleural effusion, acute respiratory failure, cardiac tamponade, mediastinal mass. EXAM: PORTABLE CHEST 1 VIEW COMPARISON:  Portable chest x-ray of January 27, 2015 FINDINGS: The trachea has apparently been extubated. There is linear radiodensity that projects over the lower neck which could be a portion of the endotracheal tube but if so it is positioned quite high. The right lung remains well-expanded and clear. On the left only a small amount of aerated lung persists in the apex and laterally. The left chest tube is stable. The large mediastinal mass is unchanged. There is no pneumothorax nor definite pleural effusion. The esophagogastric tube tip projects below the inferior margin of the image. The right internal jugular venous catheter tip projects over the midportion of the SVC. IMPRESSION: Stable appearance of the chest following extubation of the trachea. Persistent large mediastinal mass. There is no pneumothorax. Electronically Signed   By: David  Martinique M.D.   On: 01/31/2015 07:33   Dg Chest Port 1 View  01/30/2015  CLINICAL DATA:  Persistent fever. Status post pericardial window and mediastinal biopsy. EXAM: PORTABLE CHEST 1 VIEW COMPARISON:  Multiple chest x-rays and chest CT from 01/19/2015 FINDINGS: The need support apparatus is stable. The endotracheal tube is 4.3 cm above the carina. The NG tube is stable. The left-sided chest tube is stable. Persistent large mediastinal mass occupying good portion of the left hemi thorax. Persistent vascular congestion and bibasilar atelectasis. IMPRESSION: Stable support apparatus. Stable left-sided chest tube.  No definite pneumothorax. Stable large mass occupying a good portion of the left hemi thorax. Electronically Signed   By: Marijo Sanes M.D.   On: 01/30/2015 23:30   Dg Chest Port 1 View  01/30/2015  CLINICAL DATA:  Intubated patient, acute respiratory failure and hypoxia, history of mediastinal mass, pericardial effusion and cardiac tamponade EXAM: PORTABLE CHEST 1 VIEW COMPARISON:  Portable chest x-ray of January 29, 2015. FINDINGS: Stable volume loss on the left with large mediastinal mass. The left-sided chest tubes are unchanged in position. There is no pneumothorax nor large  pleural effusion. The right lung is well-expanded. Mild interstitial prominence throughout the right lung is stable. The pulmonary vascularity is not engorged. The endotracheal tube tip lies 3.8 cm above the carina. The right internal jugular venous catheter tip projects over the midportion of the SVC. The esophagogastric tube tip projects below the inferior margin of the image. IMPRESSION: There has been no significant change in the appearance of the chest since yesterday's study. The support tubes are in reasonable position. Electronically Signed   By: David  Martinique M.D.   On: 01/30/2015 07:21   Dg Chest Port 1 View  01/29/2015  CLINICAL DATA:  Shortness of breath, acute respiratory failure, intubated patient, history of mediastinal mass, pericardial effusion, and cardiac tamponade. EXAM: PORTABLE CHEST 1 VIEW COMPARISON:  Portable chest x-ray of January 28, 2015 FINDINGS: The right lung is well-expanded. There is a trace of pleural fluid at the right lung base. On the left the cardiac silhouette occupies much of the hemi thorax. A small amount of aerated lung in the apex and inferior laterally is still demonstrated. The endotracheal tube tip projects approximately 3.7 cm above the carina. The esophagogastric tube tip projects below the inferior margin of the image. A left-sided chest tube has its tip located over the lateral aspect of the approximately 6 rib and is stable. A second tube versus the tip of the esophagogastric tube is noted medially at the  left lung base. IMPRESSION: Persistent large mediastinal mass on the left. Slight interval worsening in volume loss on the left. The cardiac silhouette remains enlarged. No pneumothorax nor large left pleural effusion is observed. The right lung is largely clear. Electronically Signed   By: David  Martinique M.D.   On: 01/29/2015 07:25   Dg Chest Port 1 View  01/28/2015  CLINICAL DATA:  ET tube placement EXAM: PORTABLE CHEST 1 VIEW COMPARISON:  01/27/15 FINDINGS: The ET tube tip is stable above the carina. There is a right IJ catheter with tip in the projection of the SVC. Left-sided chest tube is identified. No pneumothorax. Stable cardiac enlargement and stable large anterior mediastinal mass. No change in small bilateral pleural effusions and pulmonary edema consistent with CHF. IMPRESSION: 1. Left chest tube in place without visible pneumothorax. 2. Large anterior mediastinal mass 3. Bilateral pleural effusions and pulmonary edema. Electronically Signed   By: Kerby Moors M.D.   On: 01/28/2015 08:28   Dg Chest Port 1 View  01/27/2015  CLINICAL DATA:  Pericardial effusion EXAM: PORTABLE CHEST 1 VIEW COMPARISON:  02/08/2015 FINDINGS: ET tube tip is stable above the carina. There is a right IJ catheter with tip in the projection of the SVC. Left chest tube is in place. No pneumothorax. NG tube tip is in the stomach. Cardiac enlargement and large anterior mediastinal mass is again noted. Unchanged from previous exam. Increase in pleural fluid noted bilaterally. IMPRESSION: 1. Stable support apparatus. 2. Left chest tube without pneumothorax. 3. Increase in bilateral pleural effusions. Electronically Signed   By: Kerby Moors M.D.   On: 01/27/2015 09:00   Dg Chest Portable 1 View  02/17/2015  CLINICAL DATA:  Status post pericardial window and Chamberlain procedure for large mediastinal mass with associated pericardial effusion. EXAM: PORTABLE CHEST 1 VIEW COMPARISON:  CT of the chest on 01/19/2015 FINDINGS:  Endotracheal tube present with the tip approximately 2.5 cm above the carina. Right jugular central line has been placed with the catheter tip in the SVC. Pericardial drain and left-sided chest tube present. Nasogastric tube  extends into the distal esophagus and does not extend below the diaphragm. No pneumothorax identified. Lung volumes are very low bilaterally. Huge mediastinal mass again visualized which is more prominent to the left of midline. IMPRESSION: 1. No evidence of pneumothorax postoperatively. 2. Low lung volumes. 3. Nasogastric tube tip lies in the distal esophagus. Electronically Signed   By: Aletta Edouard M.D.   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 LDH level 950. 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 Cytogenetics and FISH studies pending. Low CD20 positivity which can be seen about 16-20% of diffuse large B-cell lymphoma is and could potentially portend a poor prognosis independent of the IPI index. (Blood 2009 [id number] Hilaria Ota et al.) Blood counts stable. Difficulty with breathing due to airway compression. 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   Decreasing LDH levels suggestive of response to treatment. Plan -Received first cycle of CHOP on 02/02/2015. -Rituxan not given over the weekend due to staffing issues. Ordered to be given today. -daily tumor lysis labs. -continue Allopurinol for TLS prophylaxis (dose increased over the weekend - will reduce  Down in the next day or so if stable labs post Rituxan -IV solumedrol continue at current dose for 5 days (instead of PO prednisone as part of CHOP) -Will need  outpatient PET/CT scan. -We'll continue to follow daily  #2 ?HCAP/ventilator acquired pneumonia - elevated procalcitonin --- improving -continue antibiotics and vent management as per critical care team.  #3 hypernatremia likely due to free water deficit . Will need to increase the free water intake. .  Intake/Output Summary (Last 24 hours) at 02/05/15 1313 Last data filed at 02/05/15 1200  Gross per 24 hour  Intake 3063.85 ml  Output   2750 ml  Net 313.85 ml   Fluid and electrolyte management as per critical care team.  #4 Previous history of upper extremity melanoma status post resection couple of years ago. Family notes that she was followed at [facility] with repeat scans that showed no evidence of metastatic disease or recurrence. She was last evaluated about a year ago.  #5 history of rheumatoid arthritis previously on methotrexate  Oncology will continue to follow daily.    Sullivan Lone MD [place] AAHIVMS [facility] Hematology/Oncology Physician [facility]  (Office):       [phone] (Work cell):  [phone] (Fax):           [phone]  02/05/2015 12:33 PM

## 2015-02-05 NOTE — Progress Notes (Signed)
Manual calculation of BSA and dosing for Rituximab completed.  Secondary verification by Drue Dun, RN

## 2015-02-06 ENCOUNTER — Inpatient Hospital Stay (HOSPITAL_COMMUNITY): Payer: Medicare (Managed Care)

## 2015-02-06 DIAGNOSIS — R739 Hyperglycemia, unspecified: Secondary | ICD-10-CM

## 2015-02-06 LAB — CBC WITH DIFFERENTIAL/PLATELET
BASOS PCT: 0 %
Basophils Absolute: 0 10*3/uL (ref 0.0–0.1)
Eosinophils Absolute: 0 10*3/uL (ref 0.0–0.7)
Eosinophils Relative: 0 %
HEMATOCRIT: 32.1 % — AB (ref 36.0–46.0)
HEMOGLOBIN: 9.8 g/dL — AB (ref 12.0–15.0)
LYMPHS ABS: 0 10*3/uL — AB (ref 0.7–4.0)
LYMPHS PCT: 1 %
MCH: 30.5 pg (ref 26.0–34.0)
MCHC: 30.5 g/dL (ref 30.0–36.0)
MCV: 100 fL (ref 78.0–100.0)
MONO ABS: 0 10*3/uL — AB (ref 0.1–1.0)
MONOS PCT: 0 %
NEUTROS PCT: 99 %
Neutro Abs: 7.7 10*3/uL (ref 1.7–7.7)
Platelets: 355 10*3/uL (ref 150–400)
RBC: 3.21 MIL/uL — ABNORMAL LOW (ref 3.87–5.11)
RDW: 15.1 % (ref 11.5–15.5)
WBC: 7.7 10*3/uL (ref 4.0–10.5)

## 2015-02-06 LAB — COMPREHENSIVE METABOLIC PANEL
ALBUMIN: 2.4 g/dL — AB (ref 3.5–5.0)
ALK PHOS: 106 U/L (ref 38–126)
ALT: 38 U/L (ref 14–54)
ANION GAP: 5 (ref 5–15)
AST: 35 U/L (ref 15–41)
BILIRUBIN TOTAL: 2.4 mg/dL — AB (ref 0.3–1.2)
BUN: 52 mg/dL — ABNORMAL HIGH (ref 6–20)
CO2: 31 mmol/L (ref 22–32)
Calcium: 9 mg/dL (ref 8.9–10.3)
Chloride: 116 mmol/L — ABNORMAL HIGH (ref 101–111)
Creatinine, Ser: 0.45 mg/dL (ref 0.44–1.00)
GFR calc Af Amer: >60 mL/min (ref >60)
Glucose, Bld: 270 mg/dL — ABNORMAL HIGH (ref 65–99)
POTASSIUM: 4.2 mmol/L (ref 3.5–5.1)
Sodium: 152 mmol/L — ABNORMAL HIGH (ref 135–145)
Total Protein: 5.5 g/dL — ABNORMAL LOW (ref 6.5–8.1)

## 2015-02-06 LAB — GLUCOSE, CAPILLARY
GLUCOSE-CAPILLARY: 204 mg/dL — AB (ref 65–99)
GLUCOSE-CAPILLARY: 213 mg/dL — AB (ref 65–99)
GLUCOSE-CAPILLARY: 240 mg/dL — AB (ref 65–99)
GLUCOSE-CAPILLARY: 249 mg/dL — AB (ref 65–99)
Glucose-Capillary: 221 mg/dL — ABNORMAL HIGH (ref 65–99)
Glucose-Capillary: 250 mg/dL — ABNORMAL HIGH (ref 65–99)

## 2015-02-06 LAB — URIC ACID: URIC ACID, SERUM: 1.9 mg/dL — AB (ref 2.3–6.6)

## 2015-02-06 LAB — PHOSPHORUS: PHOSPHORUS: 2.9 mg/dL (ref 2.5–4.6)

## 2015-02-06 LAB — LACTATE DEHYDROGENASE: LDH: 603 U/L — AB (ref 98–192)

## 2015-02-06 MED ORDER — ALLOPURINOL 100 MG PO TABS
150.0000 mg | ORAL_TABLET | Freq: Two times a day (BID) | ORAL | Status: DC
  Administered 2015-02-06 – 2015-02-07 (×3): 150 mg
  Filled 2015-02-06 (×4): qty 2

## 2015-02-06 MED ORDER — LORAZEPAM 2 MG/ML IJ SOLN
0.5000 mg | INTRAMUSCULAR | Status: DC | PRN
  Administered 2015-02-07: 0.5 mg via INTRAVENOUS
  Filled 2015-02-06: qty 1

## 2015-02-06 MED ORDER — DEXMEDETOMIDINE HCL IN NACL 200 MCG/50ML IV SOLN
0.2000 ug/kg/h | INTRAVENOUS | Status: DC
  Administered 2015-02-06: 0.4 ug/kg/h via INTRAVENOUS
  Administered 2015-02-06: 0.398 ug/kg/h via INTRAVENOUS
  Administered 2015-02-06 – 2015-02-07 (×4): 0.4 ug/kg/h via INTRAVENOUS
  Filled 2015-02-06 (×6): qty 50

## 2015-02-06 MED ORDER — INSULIN DETEMIR 100 UNIT/ML ~~LOC~~ SOLN
30.0000 [IU] | Freq: Every day | SUBCUTANEOUS | Status: DC
  Administered 2015-02-06 – 2015-02-08 (×3): 30 [IU] via SUBCUTANEOUS
  Filled 2015-02-06 (×4): qty 0.3

## 2015-02-06 NOTE — Progress Notes (Signed)
Name: Maria Bauer MRN: [id number] DOB: [birth year]    ADMISSION DATE:  02/11/2015  CONSULTATION DATE:  02/21/2015  REFERRING MD :  Dr. 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  SIGNIFICANT EVENTS  11/03 Admit 11/04 To OR for pericardial window and mediastinal mass bx >> remains on vent post-op 11/08 Fever, started Abx 11/09 Off pressors 11/10 Oncology consulted 11/11 [name]); transfer to [facility] for chemo (R-CHOP) 11/13  Remains on sedation.  Tolerated pressure support. 11/14  Episode of SVT with suctioning overnight.  Did not tolerated PSV this am due to anxiety.  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  S/P chemo (rituximab) 11/14   VITAL SIGNS: BP 156/66 mmHg  Pulse 63  Temp(Src) 98.1 F (36.7 C) (Core (Comment))  Resp 12  Ht 5\' 4"  (1.626 m)  Wt 212 lb 8.4 oz (96.4 kg)  BMI 36.46 kg/m2  SpO2 97%   VENT SETTINGS: Vent Mode:  [-] PCV FiO2 (%):  [40 %] 40 % Set Rate:  [12 bmp] 12 bmp PEEP:  [5 cmH20] 5 cmH20 Plateau Pressure:  [13 cmH20-19 cmH20] 18 cmH20   INTAKE/OUTPUT: I/O last 3 completed shifts: In: 5219.2 [I.V.:2769.2; NG/GT:2400; IV Piggyback:50] Out: [id number] [Urine:3925]  PHYSICAL EXAMINATION:  General: elderly female in NAD on vent, appears uncomfortable  HENT: trach site clean, sutures intact  PULM: even/non-labored, decreased BS on L CV: s1s2 rrr, no m/r/g  GI: soft, non tender MSK: trace generalized edema Neuro: RASS 0  CBC Recent Labs     02/04/15  0610  02/05/15  0448  02/06/15  0556  WBC  7.2  4.5  7.7  HGB  10.3*  9.3*  9.8*  HCT  34.4*  30.6*  32.1*  PLT  403*  295  355   Coag's No results for input(s): APTT, INR in the last 72 hours.  BMET Recent Labs   02/04/15  1500  02/05/15  0448  02/06/15  0556  NA  150*  155*  152*  K  4.4  4.5  4.2  CL  113*  117*  116*  CO2  29  32  31  BUN  89*  71*  52*  CREATININE  0.70  0.55  0.45  GLUCOSE  239*  260*  270*    Electrolytes Recent Labs     02/04/15  1500  02/05/15  0448  02/06/15  0556  CALCIUM  9.2  9.0  9.0  MG   --   2.9*   --   PHOS  3.8  3.0  2.9   Sepsis Markers No results for input(s): PROCALCITON, O2SATVEN in the last 72 hours.  Invalid input(s): LACTICACIDVEN  ABG No results for input(s): PHART, PCO2ART, PO2ART in the last 72 hours.   Liver Enzymes Recent Labs     02/04/15  0610  02/05/15  0448  02/06/15  0556  AST  15  16  35  ALT  20  19  38  ALKPHOS  119  114  106  BILITOT  0.8  1.3*  2.4*  ALBUMIN  2.5*  2.2*  2.4*   Glucose Recent Labs     02/05/15  1230  02/05/15  1721  02/05/15  1908  02/05/15  2354  02/06/15  0458  02/06/15  0740  GLUCAP  215*  252*  221*  240*  249*  221*    Imaging Dg Chest Port 1 View  02/06/2015  CLINICAL DATA:  Respiratory failure EXAM: PORTABLE CHEST 1 VIEW COMPARISON:  Yesterday FINDINGS: Tubular devices are stable. Airspace disease throughout the left lung and at the right base is worse. Heart remains enlarged. Stable vascular congestion. Less aeration in yesterday. IMPRESSION: Worsening bilateral airspace disease left greater than right. Electronically Signed   By: Marybelle Killings M.D.   On: 02/06/2015 07:41   Dg Chest Port 1 View  02/05/2015  CLINICAL DATA:  Respiratory failure. Mediastinal mass with pericardial effusion. EXAM: PORTABLE CHEST 1 VIEW COMPARISON:  Radiographs dated 02/03/2015 and 02/04/2015 FINDINGS: Central line, NG tube, and tracheostomy tube appear in good position, unchanged. There is new slight pulmonary vascular congestion. No change in slight atelectasis in left lung. Mediastinal mass is obscured due to rotation. Right lung is clear of infiltrates and atelectasis. IMPRESSION: New slight pulmonary  vascular prominence. Slight atelectasis in the left midzone, stable. Electronically Signed   By: Lorriane Shire M.D.   On: 02/05/2015 07:24   Dg Abd Portable 1v  02/05/2015  CLINICAL DATA:  NG tube placement. EXAM: PORTABLE ABDOMEN - 1 VIEW COMPARISON:  CT scan 02/01/2015 FINDINGS: The NG tube is in the body region of the stomach. Scattered air in the small bowel and colon along with some contrast in the colon could suggest a mild ileus. No free air. Bibasilar atelectasis. IMPRESSION: NG tube in good position in the body region the stomach. Moderate air throughout the bowel may suggest a diffuse ileus. Electronically Signed   By: Marijo Sanes M.D.   On: 02/05/2015 13:47    ASSESSMENT / PLAN:  PULMONARY Trach 11/11 >> A: Acute respiratory failure. Failure to wean from vent s/p trach. P: Pressure support wean to trach collar as tolerated  Change to pressure control as rest mode on 11/13 F/u CXR intermittently Remove sutures 11/18 Consider diuresis once Na issues corrected  CARDIOVASCULAR: Rt IJ CVL 11/04 >> A: Malignant pericardial effusion s/p window. Hypotension likely from hypovolemia and sedation >> improved. Hx of HLD, HTN. P: Resume outpatient lopressor at reduced dose 11/14 Continue zocor  Monitor hemodynamics  RENAL A:  Hypokalemia. Hypernatremia. P: Replace electrolytes as needed Continue free water Change IVF to D5 1/2NS @ 75 ml/hr  GASTROENTEROLOGY:  A:  Nutrition. Gastroparesis. P:  Tube feeding per Nutrition  Monitor abdominal exam (note moderate air on KUB 11/14 for NGT placement) Zantac for SUP Reglan d/c'd 11/13  HEMATOLOGY/ONCOLOGY: A: B cell lymphoma. Anemia of critical illness. P: Chemotherapy (R-CHOP) >> using solumedrol in place of prednisone Allopurinol for tumor lysis syndrome prophylaxis F/u CBC Lovenox for DVT prevention  ID:  A: Fever, tracheobronchitis. P: Day 7/x of Abx, changed to rocephin 11/12 Consider d/c abx  11/16  Blood 11/09 >> neg Sputum 11/09 >> Moderate Streptococcus, beta hemolytic  ENDOCRINE: A: Steroid induced hyperglycemia. P: SSI  Increase levemir to 30 units  NEUROLOGY:  A:  Agitation. Anxiety P: RASS goal 0 Resume PRN ativan Attempt to wean precedex to off   RHEUMATOLOGY: A: Hx of RA >> was on MTX as outpt. P: Will need to re-assess as outpt    Noe Gens, NP-C [facility] Pgr: [phone] or if no answer [phone] 02/06/2015, 8:20 AM

## 2015-02-06 NOTE — Progress Notes (Signed)
[facility] Progress Note Patient Name: Faye Dillashaw DOB: 12/16/1945 MRN: [id number]   Date of Service  02/06/2015  HPI/Events of Note  Anxiety.  eICU Interventions  Will increase the Ativan 0.5 mg IV dose to Q 3 hours.      Intervention Category Minor Interventions: Agitation / anxiety - evaluation and management  Jasun Gasparini [place] 02/06/2015, 10:18 PM

## 2015-02-06 NOTE — Progress Notes (Signed)
[facility]   HEMATOLOGY/ONCOLOGY INPATIENT PROGRESS NOTE  Date of Service: 02/06/2015  Inpatient Attending: .Chesley Mires, MD   SUBJECTIVE  Maria Bauer was seen in the MICU today. She is sitting up in the bed and appears quite awake and is nodding yes/no responses to simple questions. No evidence of overt discomfort. Tolerated Rituxan well yesterday with no acute concerns. No fevers or chills. Hypernatremia gradually improving. Blood sugar still no well controlled probably due to steroids. Will be completing her high dose steroids as a part of her R-CHOP regimen tonight. Could start tapering off steroids tomorrow as per pulmonary needs.  OBJECTIVE:  PHYSICAL EXAMINATION: . Filed Vitals:   02/06/15 1000 02/06/15 1100 02/06/15 1200 02/06/15 1201  BP: 173/65 148/60 173/81 173/81  Pulse: 65 58 58 61  Temp: 98.1 F (36.7 C) 98.1 F (36.7 C) 98.1 F (36.7 C)   TempSrc:      Resp: 13 12 12 12   Height:      Weight:      SpO2: 97% 96% 96%    Filed Weights   02/02/15 0500 02/03/15 0319 02/04/15 1600  Weight: 209 lb 14.1 oz (95.2 kg) 203 lb 11.3 oz (92.4 kg) 212 lb 8.4 oz (96.4 kg)   .Body mass index is 36.46 kg/(m^2).   GENERAL acute distress, status post trach still on ventilator, NGT in situ SKIN: skin color, texture, turgor are normal, no rashes or significant lesions EYES: normal, conjunctiva are pink and non-injected, sclera clear OROPHARYNX:no exudate, no erythema and lips, buccal mucosa NECK: supple, thyroid normal size, non-tender, without nodularity LYMPH: no palpable lymphadenopathy in the cervical, axillary or inguinal area LUNGS: Distant breath , decreased air entry LL>>Rt HEART: regular rate & rhythm and no murmurs and trace pedal edema ABDOMEN:soft, non-tender and normal bowel sounds, no hepatosplenomegaly. NEURO: more awake today and nods yes/no responses to simple questions.   MEDICAL HISTORY:  Past Medical History  Diagnosis Date  . High cholesterol   .  Hypertension   . Acid reflux   . Insomnia   . Scoliosis   . Asthma     "stopped in the 1990's"  . Anemia   . Peptic ulcer   . Daily headache   . Rheumatoid arthritis ([place])     "everywhere" (01/28/2015)  . Chronic lower back pain     "mostly on the right" (01/29/2015)  . Anxiety   . Melanoma ([place]) 11/2011    "left forearm"    SURGICAL HISTORY: Past Surgical History  Procedure Laterality Date  . Appendectomy    . Total knee arthroplasty Bilateral   . Joint replacement    . Total abdominal hysterectomy    . Melanoma excision Left 11/2011    "forearm"  . Lymph node biopsy      "around the back of neck"  . Axillary lymph node biopsy Left   . Inguinal lymph node biopsy Left   . Pericardial window N/A 02/04/2015    Procedure: PERICARDIAL WINDOW;  Surgeon: Ivin Poot, MD;  Location: [place];  Service: Thoracic;  Laterality: N/A;  . Mediastinotomy chamberlain mcneil Left 02/17/2015    Procedure: MEDIASTINOTOMY CHAMBERLAIN MCNEIL;  Surgeon: Ivin Poot, MD;  Location: [facility] OR;  Service: Thoracic;  Laterality: Left;    SOCIAL HISTORY: Social History   Social History  . Marital Status: Married    Spouse Name: N/A  . Number of Children: N/A  . Years of Education: N/A   Occupational History  . Not on file.  Social History Main Topics  . Smoking status: Never Smoker   . Smokeless tobacco: Never Used  . Alcohol Use: No  . Drug Use: No  . Sexual Activity: Not on file   Other Topics Concern  . Not on file   Social History Narrative    FAMILY HISTORY: Family History  Problem Relation Age of Onset  . Cancer Sister 67    BREAST    ALLERGIES:  is allergic to morphine and related and norco.  MEDICATIONS:  Scheduled Meds: . allopurinol  150 mg Per Tube BID  . antiseptic oral rinse  7 mL Mouth Rinse QID  . bisacodyl  10 mg Oral Daily  . cefTRIAXone (ROCEPHIN)  IV  1 g Intravenous Q24H  . chlorhexidine gluconate  15 mL Mouth Rinse BID  . enoxaparin (LOVENOX)  injection  40 mg Subcutaneous Daily  . feeding supplement (PRO-STAT SUGAR FREE 64)  60 mL Per Tube TID  . feeding supplement (VITAL HIGH PROTEIN)  1,000 mL Per Tube Q24H  . free water  250 mL Per Tube Q6H  . insulin aspart  0-20 Units Subcutaneous 6 times per day  . insulin detemir  30 Units Subcutaneous Daily  . metoprolol tartrate  12.5 mg Oral BID  . ranitidine  150 mg Oral BID  . senna-docusate  1 tablet Oral QHS   Continuous Infusions: . dexmedetomidine 0.4 mcg/kg/hr (02/06/15 1223)  . dextrose 5 % and 0.45% NaCl 75 mL/hr at 02/05/15 1500  . fentaNYL infusion INTRAVENOUS 200 mcg/hr (02/06/15 1200)   PRN Meds:.albuterol, alteplase, diphenhydrAMINE, diphenhydrAMINE, EPINEPHrine, EPINEPHrine, EPINEPHrine, EPINEPHrine, famotidine, fentaNYL (SUBLIMAZE) injection, heparin lock flush, heparin lock flush, LORazepam, methylPREDNISolone sodium succinate, ondansetron (ZOFRAN) IV, sodium chloride, sodium chloride, sodium chloride, sodium chloride  REVIEW OF SYSTEMS:    10 Point review of Systems was done is negative except as noted above.   LABORATORY DATA:  I have reviewed the data as listed  . CBC Latest Ref Rng 02/06/2015 02/05/2015 02/04/2015  WBC 4.0 - 10.5 K/uL 7.7 4.5 7.2  Hemoglobin 12.0 - 15.0 g/dL 9.8(L) 9.3(L) 10.3(L)  Hematocrit 36.0 - 46.0 % 32.1(L) 30.6(L) 34.4(L)  Platelets 150 - 400 K/uL 355 295 403(H)    . CMP Latest Ref Rng 02/06/2015 02/05/2015 02/04/2015  Glucose 65 - 99 mg/dL 270(H) 260(H) 239(H)  BUN 6 - 20 mg/dL 52(H) 71(H) 89(H)  Creatinine 0.44 - 1.00 mg/dL 0.45 0.55 0.70  Sodium 135 - 145 mmol/L 152(H) 155(H) 150(H)  Potassium 3.5 - 5.1 mmol/L 4.2 4.5 4.4  Chloride 101 - 111 mmol/L 116(H) 117(H) 113(H)  CO2 22 - 32 mmol/L 31 32 29  Calcium 8.9 - 10.3 mg/dL 9.0 9.0 9.2  Total Protein 6.5 - 8.1 g/dL 5.5(L) 5.2(L) -  Total Bilirubin 0.3 - 1.2 mg/dL 2.4(H) 1.3(H) -  Alkaline Phos 38 - 126 U/L 106 114 -  AST 15 - 41 U/L 35 16 -  ALT 14 - 54 U/L 38 19 -      RADIOGRAPHIC STUDIES: I have personally reviewed the radiological images as listed and agreed with the findings in the report. Ct Abdomen Pelvis Wo Contrast  02/01/2015  CLINICAL DATA:  Diffuse large B-cell lymphoma undergoing chemotherapy. EXAM: CT ABDOMEN AND PELVIS WITHOUT CONTRAST TECHNIQUE: Multidetector CT imaging of the abdomen and pelvis was performed following the standard protocol without IV contrast. COMPARISON:  Recent chest CT 01/19/2015 FINDINGS: Examination is degraded by motion. Lower chest: There is air in the left chest wall likely from recent biopsy/surgery. The  left-sided chest tube is in place. Extensive left lower lobe airspace consolidation which could reflect atelectasis or pneumonia. Small bilateral pleural effusions. Right basilar atelectasis. There is an NG tube coursing down the esophagus and into the stomach. Bulky mediastinal lymphadenopathy again demonstrated. Coronary artery calcifications are noted. Hepatobiliary: No focal hepatic lesions except for a small cyst in the right hepatic lobe inferiorly. No evidence of metastatic disease without IV contrast. The gallbladder is grossly normal. No common bile duct dilatation. Pancreas: No mass, inflammation or ductal dilatation. Spleen: Normal size.  No definite lesions. Adrenals/Urinary Tract: The adrenal glands are normal. Both kidneys are normal except for a right renal calculus. No hydronephrosis. Stomach/Bowel: The stomach contains an NG tube. The duodenum, small bowel and colon are unremarkable. No inflammatory changes, mass lesions or obstructive findings. A rectal catheter is in place. Vascular/Lymphatic: No mesenteric or retroperitoneal mass or adenopathy. Small scattered lymph nodes are noted. There are moderate to advanced aortic calcifications but no focal aneurysm. Other: No pelvic mass or adenopathy. No free pelvic fluid collections. A Foley catheter is noted in the bladder. The uterus is surgically absent. No  inguinal mass or adenopathy. Musculoskeletal: No significant bony findings. Degenerative changes involving the spine with severe facet disease. There is a remote appearing compression fracture of L1. IMPRESSION: 1. Left-sided chest tube in place with a small left pleural effusion. Significant left lower lobe airspace consolidation suggesting pneumonia or dense atelectasis. 2. Bulky mediastinal lymphadenopathy. 3. No adenopathy in the abdomen or pelvis. 4. No acute abdominal/pelvic findings. Electronically Signed   By: Marijo Sanes M.D.   On: 02/01/2015 21:33   Dg Chest 2 View  01/17/2015  CLINICAL DATA:  Cough and shortness of breath for the past several days EXAM: CHEST  2 VIEW COMPARISON:  None in PACs FINDINGS: There is an abnormal soft tissue mass arising from the left hilar and suprahilar regions adjacent to the AP window. The aortic knob remains reasonably well demonstrated. The cardiac silhouette is enlarged. The pulmonary vascularity is not engorged. The lungs are well-expanded. There is no focal infiltrate nor significant pleural effusion. There is moderate dextrocurvature of the thoracic spine centered at approximately T8. IMPRESSION: Large abnormal anterior mediastinal mass. This may be neoplastic or vascular in nature. CT scanning of the chest now is recommended. These results will be called to the ordering clinician or representative by the Radiologist Assistant, and communication documented in the PACS or zVision Dashboard. Electronically Signed   By: David  Martinique M.D.   On: 01/17/2015 11:15   Ct Chest W Contrast  01/19/2015  CLINICAL DATA:  Cough, shortness of breath, possible chest mass. EXAM: CT CHEST WITH CONTRAST TECHNIQUE: Multidetector CT imaging of the chest was performed during intravenous contrast administration. CONTRAST:  61mL ISOVUE-300 IOPAMIDOL (ISOVUE-300) INJECTION 61% COMPARISON:  January 17, 2015. FINDINGS: No pneumothorax or pleural effusion is noted. 5 mm nodule is noted  posteriorly in the right lower lobe best seen on image number 27 of series 4. Atherosclerosis of thoracic aorta is noted without aneurysm or dissection. Great vessels are widely patent. Mild coronary artery calcifications are noted. Moderate pericardial effusion is noted. Visualized portion of upper abdomen is unremarkable. Abnormal soft tissue density is noted in the anterior mediastinum that measures 12.0 x 8.8 cm. This is noted to compress a portion of the left innominate vein. This is most consistent with adenopathy consistent with lymphoma or metastatic disease. No significant osseous abnormality is noted. IMPRESSION: 12.0 x 8.8 cm abnormal soft tissue density  seen in anterior mediastinum concerning for adenopathy related to lymphoma or other metastatic disease. Some compression of the left innominate vein is noted as a result. Moderate pericardial effusion is noted as well. 5 mm nodule is noted posteriorly in the right lower lobe. This may represent metastatic focus given above finding. Alternatively, it may be unrelated. Continued CT follow-up is recommended to ensure stability. These results will be called to the ordering clinician or representative by the Radiologist Assistant, and communication documented in the PACS or zVision Dashboard. Electronically Signed   By: Marijo Conception, M.D.   On: 01/19/2015 12:07   Dg Chest Port 1 View  02/06/2015  CLINICAL DATA:  Respiratory failure EXAM: PORTABLE CHEST 1 VIEW COMPARISON:  Yesterday FINDINGS: Tubular devices are stable. Airspace disease throughout the left lung and at the right base is worse. Heart remains enlarged. Stable vascular congestion. Less aeration in yesterday. IMPRESSION: Worsening bilateral airspace disease left greater than right. Electronically Signed   By: Marybelle Killings M.D.   On: 02/06/2015 07:41   Dg Chest Port 1 View  02/05/2015  CLINICAL DATA:  Respiratory failure. Mediastinal mass with pericardial effusion. EXAM: PORTABLE CHEST 1  VIEW COMPARISON:  Radiographs dated 02/03/2015 and 02/04/2015 FINDINGS: Central line, NG tube, and tracheostomy tube appear in good position, unchanged. There is new slight pulmonary vascular congestion. No change in slight atelectasis in left lung. Mediastinal mass is obscured due to rotation. Right lung is clear of infiltrates and atelectasis. IMPRESSION: New slight pulmonary vascular prominence. Slight atelectasis in the left midzone, stable. Electronically Signed   By: Lorriane Shire M.D.   On: 02/05/2015 07:24   Dg Chest Port 1 View  02/04/2015  CLINICAL DATA:  69 year old female with respiratory failure and mediastinal mass. EXAM: PORTABLE CHEST 1 VIEW COMPARISON:  02/03/2015 and prior radiograph FINDINGS: Tracheostomy tube, right IJ central venous catheter with tip overlying the lower SVC, and NG tube with tip overlying the proximal -mid stomach again noted. A large left mediastinal mass is again noted. The cardiomediastinal silhouette is unchanged. There is no evidence of pneumothorax. Continued left basilar atelectasis noted. IMPRESSION: Little significant change. Left mediastinal mass with left basilar atelectasis Electronically Signed   By: Margarette Canada M.D.   On: 02/04/2015 07:42   Dg Chest Port 1 View  02/03/2015  CLINICAL DATA:  Respiratory distress. Patient with a tracheostomy. Subsequent encounter. EXAM: PORTABLE CHEST 1 VIEW COMPARISON:  02/02/2015 FINDINGS: Masslike opacity obscures the left hilum and left mediastinum merging with the left heart border. This is without significant change. No convincing pulmonary edema. Probable atelectasis at the left lung base. No pneumothorax or obvious pleural effusion. Tracheostomy tube and right internal jugular central venous line are stable and well positioned. New orogastric tube passes below the diaphragm into the stomach. IMPRESSION: 1. No acute findings in the lungs. 2. Stable masslike opacity along the anterior mediastinum. 3. New orogastric tube  is well positioned. Tracheostomy tube and right internal jugular central venous line are stable and also well positioned. Electronically Signed   By: Lajean Manes M.D.   On: 02/03/2015 08:34   Dg Chest Port 1 View  02/02/2015  CLINICAL DATA:  Tracheostomy tube placement. EXAM: PORTABLE CHEST 1 VIEW COMPARISON:  Earlier film 02/02/2015. FINDINGS: The tracheostomy tube is in good position with the tip at the mid tracheal level. The right IJ catheter is stable. Stable mediastinal adenopathy/mass and persistent left lower lobe infiltrate/atelectasis/effusion. IMPRESSION: Tracheostomy tube in good position without complicating features. Electronically Signed  By: Marijo Sanes M.D.   On: 02/02/2015 12:18   Dg Chest Port 1 View  02/02/2015  CLINICAL DATA:  Shortness of breath. EXAM: PORTABLE CHEST 1 VIEW COMPARISON:  02/01/2015 FINDINGS: Examination is partially limited by leftward patient rotation. Endotracheal tube terminates above the level of the thoracic inlet and has likely been slightly retracted in the interim. Enteric tube courses towards the left upper abdomen with tip not imaged. Right jugular central venous catheter remains, projecting over the SVC. Known, large mediastinal mass is again seen. Cardiac silhouette remains enlarged. Left chest tube remains in place. No pneumothorax is identified. Left basilar opacities have slightly improved. Mild right basilar opacity is unchanged may reflect atelectasis. IMPRESSION: 1. Mild interval retraction of the endotracheal tube which projects above the thoracic inlet. Consider advancement. 2. Minimally improved aeration of the left lung base. No pneumothorax. Electronically Signed   By: Logan Bores M.D.   On: 02/02/2015 07:18   Dg Chest Port 1 View  02/01/2015  CLINICAL DATA:  Pericardial window.  Pericardial effusion. EXAM: PORTABLE CHEST 1 VIEW COMPARISON:  01/31/2015 FINDINGS: Endotracheal tube in good position. NG tube in place with the tip not  visualized due to underpenetration. Right jugular central venous catheter tip in the SVC. No pneumothorax on the right Interval improvement in aeration of the left lung with decrease in density left lower lobe. This may be due to decreased diffusion atelectasis. Left chest tube remains in place. No pneumothorax on the left. Cardiac enlargement has improved. Right lower lobe atelectasis is present. IMPRESSION: Endotracheal tube in good position. Decreased left lower lobe density with improved aeration of the left lung. Left chest tube in place without pneumothorax. Electronically Signed   By: Franchot Gallo M.D.   On: 02/01/2015 07:16   Dg Chest Port 1 View  01/31/2015  CLINICAL DATA:  Status post median sternotomy and pericardial window creation for pleural effusion, acute respiratory failure, cardiac tamponade, mediastinal mass. EXAM: PORTABLE CHEST 1 VIEW COMPARISON:  Portable chest x-ray of January 27, 2015 FINDINGS: The trachea has apparently been extubated. There is linear radiodensity that projects over the lower neck which could be a portion of the endotracheal tube but if so it is positioned quite high. The right lung remains well-expanded and clear. On the left only a small amount of aerated lung persists in the apex and laterally. The left chest tube is stable. The large mediastinal mass is unchanged. There is no pneumothorax nor definite pleural effusion. The esophagogastric tube tip projects below the inferior margin of the image. The right internal jugular venous catheter tip projects over the midportion of the SVC. IMPRESSION: Stable appearance of the chest following extubation of the trachea. Persistent large mediastinal mass. There is no pneumothorax. Electronically Signed   By: David  Martinique M.D.   On: 01/31/2015 07:33   Dg Chest Port 1 View  01/30/2015  CLINICAL DATA:  Persistent fever. Status post pericardial window and mediastinal biopsy. EXAM: PORTABLE CHEST 1 VIEW COMPARISON:  Multiple  chest x-rays and chest CT from 01/19/2015 FINDINGS: The need support apparatus is stable. The endotracheal tube is 4.3 cm above the carina. The NG tube is stable. The left-sided chest tube is stable. Persistent large mediastinal mass occupying good portion of the left hemi thorax. Persistent vascular congestion and bibasilar atelectasis. IMPRESSION: Stable support apparatus. Stable left-sided chest tube.  No definite pneumothorax. Stable large mass occupying a good portion of the left hemi thorax. Electronically Signed   By: Marijo Sanes  M.D.   On: 01/30/2015 23:30   Dg Chest Port 1 View  01/30/2015  CLINICAL DATA:  Intubated patient, acute respiratory failure and hypoxia, history of mediastinal mass, pericardial effusion and cardiac tamponade EXAM: PORTABLE CHEST 1 VIEW COMPARISON:  Portable chest x-ray of January 29, 2015. FINDINGS: Stable volume loss on the left with large mediastinal mass. The left-sided chest tubes are unchanged in position. There is no pneumothorax nor large pleural effusion. The right lung is well-expanded. Mild interstitial prominence throughout the right lung is stable. The pulmonary vascularity is not engorged. The endotracheal tube tip lies 3.8 cm above the carina. The right internal jugular venous catheter tip projects over the midportion of the SVC. The esophagogastric tube tip projects below the inferior margin of the image. IMPRESSION: There has been no significant change in the appearance of the chest since yesterday's study. The support tubes are in reasonable position. Electronically Signed   By: David  Martinique M.D.   On: 01/30/2015 07:21   Dg Chest Port 1 View  01/29/2015  CLINICAL DATA:  Shortness of breath, acute respiratory failure, intubated patient, history of mediastinal mass, pericardial effusion, and cardiac tamponade. EXAM: PORTABLE CHEST 1 VIEW COMPARISON:  Portable chest x-ray of January 28, 2015 FINDINGS: The right lung is well-expanded. There is a trace of  pleural fluid at the right lung base. On the left the cardiac silhouette occupies much of the hemi thorax. A small amount of aerated lung in the apex and inferior laterally is still demonstrated. The endotracheal tube tip projects approximately 3.7 cm above the carina. The esophagogastric tube tip projects below the inferior margin of the image. A left-sided chest tube has its tip located over the lateral aspect of the approximately 6 rib and is stable. A second tube versus the tip of the esophagogastric tube is noted medially at the left lung base. IMPRESSION: Persistent large mediastinal mass on the left. Slight interval worsening in volume loss on the left. The cardiac silhouette remains enlarged. No pneumothorax nor large left pleural effusion is observed. The right lung is largely clear. Electronically Signed   By: David  Martinique M.D.   On: 01/29/2015 07:25   Dg Chest Port 1 View  01/28/2015  CLINICAL DATA:  ET tube placement EXAM: PORTABLE CHEST 1 VIEW COMPARISON:  01/27/15 FINDINGS: The ET tube tip is stable above the carina. There is a right IJ catheter with tip in the projection of the SVC. Left-sided chest tube is identified. No pneumothorax. Stable cardiac enlargement and stable large anterior mediastinal mass. No change in small bilateral pleural effusions and pulmonary edema consistent with CHF. IMPRESSION: 1. Left chest tube in place without visible pneumothorax. 2. Large anterior mediastinal mass 3. Bilateral pleural effusions and pulmonary edema. Electronically Signed   By: Kerby Moors M.D.   On: 01/28/2015 08:28   Dg Chest Port 1 View  01/27/2015  CLINICAL DATA:  Pericardial effusion EXAM: PORTABLE CHEST 1 VIEW COMPARISON:  02/06/2015 FINDINGS: ET tube tip is stable above the carina. There is a right IJ catheter with tip in the projection of the SVC. Left chest tube is in place. No pneumothorax. NG tube tip is in the stomach. Cardiac enlargement and large anterior mediastinal mass is again  noted. Unchanged from previous exam. Increase in pleural fluid noted bilaterally. IMPRESSION: 1. Stable support apparatus. 2. Left chest tube without pneumothorax. 3. Increase in bilateral pleural effusions. Electronically Signed   By: Kerby Moors M.D.   On: 01/27/2015 09:00   Dg  Chest Portable 1 View  01/30/2015  CLINICAL DATA:  Status post pericardial window and Chamberlain procedure for large mediastinal mass with associated pericardial effusion. EXAM: PORTABLE CHEST 1 VIEW COMPARISON:  CT of the chest on 01/19/2015 FINDINGS: Endotracheal tube present with the tip approximately 2.5 cm above the carina. Right jugular central line has been placed with the catheter tip in the SVC. Pericardial drain and left-sided chest tube present. Nasogastric tube extends into the distal esophagus and does not extend below the diaphragm. No pneumothorax identified. Lung volumes are very low bilaterally. Huge mediastinal mass again visualized which is more prominent to the left of midline. IMPRESSION: 1. No evidence of pneumothorax postoperatively. 2. Low lung volumes. 3. Nasogastric tube tip lies in the distal esophagus. Electronically Signed   By: Aletta Edouard M.D.   On: 01/27/2015 14:30   Dg Abd Portable 1v  02/05/2015  CLINICAL DATA:  NG tube placement. EXAM: PORTABLE ABDOMEN - 1 VIEW COMPARISON:  CT scan 02/01/2015 FINDINGS: The NG tube is in the body region of the stomach. Scattered air in the small bowel and colon along with some contrast in the colon could suggest a mild ileus. No free air. Bibasilar atelectasis. IMPRESSION: NG tube in good position in the body region the stomach. Moderate air throughout the bowel may suggest a diffuse ileus. Electronically Signed   By: Marijo Sanes M.D.   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 LDH level 950. 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 Cytogenetics and FISH studies pending. Low CD20 positivity which can be seen about 16-20% of diffuse large B-cell lymphoma is and could potentially portend a poor prognosis independent of the IPI index. (Blood 2009 [id number] Hilaria Ota et al.) Blood counts stable. Difficulty with breathing due to airway compression. 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 Decreasing LDH levels suggestive of response to treatment. Plan -Received first cycle of CHOP on 02/02/2015 and Rituxan 02/05/2015. -continue Allopurinol for TLS prophylaxis will reduce dose to 150mg  po BID due to bump in LFts -IV solumedrol continue at current dose for 5 days(finshing today) -further continuation of steroids from tomorrow as per pulmonary requirements. -Will need outpatient PET/CT scan. -We'll continue to follow daily -if inadequate response precluding successful weaning we will consider radiation oncology input, though significant might be seen with chemotherapy alone as well.  #2 ?HCAP/ventilator acquired pneumonia - elevated procalcitonin --- improving -continue antibiotics and vent management as per critical care team.  #3 hypernatremia likely due to free water deficit .sodium levels improving.  Intake/Output Summary (Last 24 hours) at 02/06/15 1308 Last data filed at 02/06/15 1200  Gross per 24 hour  Intake 4088.44 ml  Output   2000 ml  Net 2088.44 ml   Fluid and electrolyte management as per critical care team.  #4 Previous history of upper extremity melanoma status post resection couple of years ago. Family notes that she was followed at [facility] with repeat scans that showed no evidence of metastatic disease or recurrence. She was last evaluated about a year ago.  #5 history of  rheumatoid arthritis previously on methotrexate  #6 Hyperglycemia due to steroids ?h/o DM2. Anticipate Blood sugars will improve and insulin requirements will drop as the patient  gets off steroids High dose steroids as part of lymphoma treatment plan will be done today -further steroids use as per pulmonary requirements - may wean off as per critical care team.  Oncology will continue to follow daily.  Plz call if any additional questions.  Sullivan Lone MD [place] AAHIVMS [facility] Hematology/Oncology Physician [facility]  (Office):       [phone] (Work cell):  [phone] (Fax):           [phone]  02/06/2015 12:48 PM

## 2015-02-07 ENCOUNTER — Encounter (HOSPITAL_COMMUNITY): Payer: Self-pay

## 2015-02-07 ENCOUNTER — Inpatient Hospital Stay (HOSPITAL_COMMUNITY): Payer: Medicare (Managed Care)

## 2015-02-07 DIAGNOSIS — E871 Hypo-osmolality and hyponatremia: Secondary | ICD-10-CM

## 2015-02-07 DIAGNOSIS — R2232 Localized swelling, mass and lump, left upper limb: Secondary | ICD-10-CM

## 2015-02-07 LAB — BASIC METABOLIC PANEL
ANION GAP: 3 — AB (ref 5–15)
BUN: 42 mg/dL — AB (ref 6–20)
CALCIUM: 8.6 mg/dL — AB (ref 8.9–10.3)
CO2: 32 mmol/L (ref 22–32)
Chloride: 113 mmol/L — ABNORMAL HIGH (ref 101–111)
Creatinine, Ser: 0.33 mg/dL — ABNORMAL LOW (ref 0.44–1.00)
GFR calc Af Amer: >60 mL/min (ref >60)
GLUCOSE: 161 mg/dL — AB (ref 65–99)
Potassium: 4.3 mmol/L (ref 3.5–5.1)
Sodium: 148 mmol/L — ABNORMAL HIGH (ref 135–145)

## 2015-02-07 LAB — CBC
HEMATOCRIT: 29.8 % — AB (ref 36.0–46.0)
HEMOGLOBIN: 9.1 g/dL — AB (ref 12.0–15.0)
MCH: 30.4 pg (ref 26.0–34.0)
MCHC: 30.5 g/dL (ref 30.0–36.0)
MCV: 99.7 fL (ref 78.0–100.0)
Platelets: 282 10*3/uL (ref 150–400)
RBC: 2.99 MIL/uL — AB (ref 3.87–5.11)
RDW: 15 % (ref 11.5–15.5)
WBC: 8.8 10*3/uL (ref 4.0–10.5)

## 2015-02-07 LAB — GLUCOSE, CAPILLARY
GLUCOSE-CAPILLARY: 158 mg/dL — AB (ref 65–99)
GLUCOSE-CAPILLARY: 160 mg/dL — AB (ref 65–99)
GLUCOSE-CAPILLARY: 160 mg/dL — AB (ref 65–99)
Glucose-Capillary: 210 mg/dL — ABNORMAL HIGH (ref 65–99)
Glucose-Capillary: 154 mg/dL — ABNORMAL HIGH (ref 65–99)
Glucose-Capillary: 191 mg/dL — ABNORMAL HIGH (ref 65–99)
Glucose-Capillary: 189 mg/dL — ABNORMAL HIGH (ref 65–99)

## 2015-02-07 LAB — HEPATIC FUNCTION PANEL
ALBUMIN: 2.1 g/dL — AB (ref 3.5–5.0)
ALT: 38 U/L (ref 14–54)
AST: 26 U/L (ref 15–41)
Alkaline Phosphatase: 81 U/L (ref 38–126)
BILIRUBIN INDIRECT: 1 mg/dL — AB (ref 0.3–0.9)
BILIRUBIN TOTAL: 2.7 mg/dL — AB (ref 0.3–1.2)
Bilirubin, Direct: 1.7 mg/dL — ABNORMAL HIGH (ref 0.1–0.5)
TOTAL PROTEIN: 4.8 g/dL — AB (ref 6.5–8.1)

## 2015-02-07 MED ORDER — ALLOPURINOL 100 MG PO TABS
150.0000 mg | ORAL_TABLET | Freq: Every day | ORAL | Status: DC
  Administered 2015-02-08 – 2015-02-11 (×4): 150 mg
  Filled 2015-02-07 (×4): qty 2

## 2015-02-07 MED ORDER — FENTANYL CITRATE (PF) 100 MCG/2ML IJ SOLN
50.0000 ug | INTRAMUSCULAR | Status: AC | PRN
  Administered 2015-02-08 – 2015-02-09 (×3): 50 ug via INTRAVENOUS
  Filled 2015-02-07 (×4): qty 2

## 2015-02-07 MED ORDER — QUETIAPINE FUMARATE 50 MG PO TABS: 25.0000 mg | ORAL_TABLET | Freq: Two times a day (BID) | ORAL | Status: DC

## 2015-02-07 MED ORDER — FUROSEMIDE 10 MG/ML IJ SOLN
20.0000 mg | Freq: Once | INTRAMUSCULAR | Status: AC
  Administered 2015-02-07: 20 mg via INTRAVENOUS
  Filled 2015-02-07: qty 2

## 2015-02-07 MED ORDER — LORAZEPAM 2 MG/ML IJ SOLN
1.0000 mg | INTRAMUSCULAR | Status: DC | PRN
  Administered 2015-02-07 – 2015-02-08 (×5): 1 mg via INTRAVENOUS
  Filled 2015-02-07 (×4): qty 1

## 2015-02-07 MED ORDER — METOPROLOL TARTRATE 25 MG PO TABS
25.0000 mg | ORAL_TABLET | Freq: Two times a day (BID) | ORAL | Status: DC
  Administered 2015-02-07 (×2): 25 mg via ORAL
  Filled 2015-02-07 (×3): qty 1

## 2015-02-07 MED ORDER — FENTANYL CITRATE (PF) 100 MCG/2ML IJ SOLN
50.0000 ug | INTRAMUSCULAR | Status: DC | PRN
  Administered 2015-02-08 – 2015-02-09 (×5): 50 ug via INTRAVENOUS
  Filled 2015-02-07 (×5): qty 2

## 2015-02-07 MED ORDER — LORAZEPAM 2 MG/ML IJ SOLN
INTRAMUSCULAR | Status: AC
  Filled 2015-02-07: qty 1

## 2015-02-07 MED ORDER — LORAZEPAM 0.5 MG PO TABS
0.5000 mg | ORAL_TABLET | Freq: Three times a day (TID) | ORAL | Status: DC
  Administered 2015-02-07 – 2015-02-09 (×7): 0.5 mg via ORAL
  Filled 2015-02-07 (×7): qty 1

## 2015-02-07 NOTE — Progress Notes (Signed)
12 Days Post-Op Procedure(s) (LRB): PERICARDIAL WINDOW (N/A) MEDIASTINOTOMY Maria Bauer (Left) Subjective: Stable on vent after PS weaning on trach CXR shows reduction in size of mediastinal lymphoma post chemo over weekend Chest and upper abdominal incisions clean and dry LUE edematous from tumor vs subclavian vein DVT-   [place] TBD   Objective: Vital signs in last 24 hours: Temp:  [97.5 F (36.4 C)-99.1 F (37.3 C)] 99.1 F (37.3 C) (11/16 1700) Pulse Rate:  [54-102] 83 (11/16 1700) Cardiac Rhythm:  [-] Normal sinus rhythm (11/16 0800) Resp:  [12-20] 20 (11/16 1700) BP: (97-201)/(45-87) 180/87 mmHg (11/16 1700) SpO2:  [95 %-99 %] 99 % (11/16 1700) FiO2 (%):  [40 %] 40 % (11/16 1659)  Hemodynamic parameters for last 24 hours:  nsr Stable BP  Intake/Output from previous day: 11/15 0701 - 11/16 0700 In: 3527 [I.V.:2087; NG/GT:1390; IV Piggyback:50] Out: [id number] [Urine:1580] Intake/Output this shift: Total I/O In: 1170 [I.V.:30; NG/GT:900; IV Piggyback:240] Out: 3450 [Urine:3450]  Sedated Lungs clear Trach site clean abd soft  Lab Results:  Recent Labs  02/06/15 0556 02/07/15 0430  WBC 7.7 8.8  HGB 9.8* 9.1*  HCT 32.1* 29.8*  PLT 355 282   BMET:  Recent Labs  02/06/15 0556 02/07/15 0430  NA 152* 148*  K 4.2 4.3  CL 116* 113*  CO2 31 32  GLUCOSE 270* 161*  BUN 52* 42*  CREATININE 0.45 0.33*  CALCIUM 9.0 8.6*    PT/INR: No results for input(s): LABPROT, INR in the last 72 hours. ABG    Component Value Date/Time   PHART 7.396 02/03/2015 0320   HCO3 26.6* 02/03/2015 0320   TCO2 24.4 02/03/2015 0320   ACIDBASEDEF 1.0 01/29/2015 0410   O2SAT 92.6 02/03/2015 0320   CBG (last 3)   Recent Labs  02/07/15 0737 02/07/15 1151 02/07/15 1556  GLUCAP 158* 160* 191*    Assessment/Plan: S/P Procedure(s) (LRB): PERICARDIAL WINDOW (N/A) MEDIASTINOTOMY Maria Bauer (Left) Will follow PT ordered   LOS: 13 days    Maria Aquas Trigt  Bauer 02/07/2015

## 2015-02-07 NOTE — Progress Notes (Signed)
Pt was able to ween comfortably today from 1015 until 1700 with minimal to no signs of distress or discomfort.

## 2015-02-07 NOTE — Progress Notes (Signed)
RT came to wean patient on PSV/CPAP. Patient vent settings 10/+5 and FiO2 40%. Patient vitals at this time are stable and no distress noted. Patient had not been tolerating weaning well, but is comfortable at this time. RN aware of mode change. RT will continue to monitor patient.

## 2015-02-07 NOTE — Progress Notes (Signed)
Name: Maria Bauer MRN: [id number] DOB: 08-Jan-1946    ADMISSION DATE:  02/01/2015  CONSULTATION DATE:  02/21/2015  REFERRING MD :  Dr. 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  SIGNIFICANT EVENTS  11/03 Admit 11/04 To OR for pericardial window and mediastinal mass bx >> remains on vent post-op 11/08 Fever, started Abx 11/09 Off pressors 11/10 Oncology consulted 11/11 Lurline Idol Maria Bauer); transfer to [facility] for chemo (R-CHOP) 11/13  Remains on sedation.  Tolerated pressure support. 11/14  Episode of SVT with suctioning overnight.  Did not tolerated PSV this am due to anxiety.  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  RN reports pt received ativan 2 mg total overnight in addition to fentanyl gtt at 200 mcg.  Pt currently with no effort on PSV wean (too sedate).     VITAL SIGNS: BP 111/58 mmHg  Pulse 102  Temp(Src) 98.2 F (36.8 C) (Core (Comment))  Resp 17  Ht 5\' 4"  (1.626 m)  Wt 212 lb 8.4 oz (96.4 kg)  BMI 36.46 kg/m2  SpO2 98%   VENT SETTINGS: Vent Mode:  [-] PCV FiO2 (%):  [40 %] 40 % Set Rate:  [12 bmp] 12 bmp PEEP:  [5 cmH20] 5 cmH20 Plateau Pressure:  [10 cmH20-22 cmH20] 19 cmH20   INTAKE/OUTPUT: I/O last 3 completed shifts: In: 5694.4 [I.V.:3144.4; NG/GT:2500; IV Piggyback:50] Out: 2780 [Urine:2780]  PHYSICAL EXAMINATION:  General: elderly female in NAD on vent, sedate HENT: trach site clean, sutures intact  PULM: even/non-labored, rhonchi CV: s1s2 rrr, no m/r/g  GI: soft, non tender MSK: trace generalized edema Neuro: RASS -2  CBC Recent Labs     02/05/15  0448  02/06/15  0556  02/07/15  0430  WBC  4.5  7.7  8.8  HGB  9.3*  9.8*  9.1*  HCT  30.6*  32.1*  29.8*  PLT  295  355  282   Coag's No  results for input(s): APTT, INR in the last 72 hours.  BMET Recent Labs     02/05/15  0448  02/06/15  0556  02/07/15  0430  NA  155*  152*  148*  K  4.5  4.2  4.3  CL  117*  116*  113*  CO2  32  31  32  BUN  71*  52*  42*  CREATININE  0.55  0.45  0.33*  GLUCOSE  260*  270*  161*    Electrolytes Recent Labs     02/04/15  1500  02/05/15  0448  02/06/15  0556  02/07/15  0430  CALCIUM  9.2  9.0  9.0  8.6*  MG   --   2.9*   --    --   PHOS  3.8  3.0  2.9   --    Sepsis Markers No results for input(s): PROCALCITON, O2SATVEN in the last 72 hours.  Invalid input(s): LACTICACIDVEN  ABG No results for input(s): PHART, PCO2ART, PO2ART in the last 72 hours.   Liver Enzymes Recent Labs     02/05/15  0448  02/06/15  0556  AST  16  35  ALT  19  38  ALKPHOS  114  106  BILITOT  1.3*  2.4*  ALBUMIN  2.2*  2.4*   Glucose Recent Labs     02/06/15  0458  02/06/15  0740  02/06/15  1158  02/06/15  1552  02/06/15  2013  02/06/15  2326  GLUCAP  249*  221*  250*  213*  204*  210*    Imaging Dg Chest Port 1 View  02/07/2015  CLINICAL DATA:  Respiratory failure.  Mediastinal mass. EXAM: PORTABLE CHEST 1 VIEW COMPARISON:  Radiographs from 02/02/2015 through 02/06/2015 FINDINGS: Tracheostomy tube, NG tube and central catheter appear unchanged. Slight increased atelectasis at the right base. Persistent atelectasis at the left base. Pulmonary vascularity is normal. IMPRESSION: Slight increased atelectasis at the right base. Persistent atelectasis on the left. Electronically Signed   By: Lorriane Shire M.D.   On: 02/07/2015 07:42   Dg Chest Port 1 View  02/06/2015  CLINICAL DATA:  Respiratory failure EXAM: PORTABLE CHEST 1 VIEW COMPARISON:  Yesterday FINDINGS: Tubular devices are stable. Airspace disease throughout the left lung and at the right base is worse. Heart remains enlarged. Stable vascular congestion. Less aeration in yesterday. IMPRESSION: Worsening bilateral airspace  disease left greater than right. Electronically Signed   By: Marybelle Killings M.D.   On: 02/06/2015 07:41   Dg Abd Portable 1v  02/05/2015  CLINICAL DATA:  NG tube placement. EXAM: PORTABLE ABDOMEN - 1 VIEW COMPARISON:  CT scan 02/01/2015 FINDINGS: The NG tube is in the body region of the stomach. Scattered air in the small bowel and colon along with some contrast in the colon could suggest a mild ileus. No free air. Bibasilar atelectasis. IMPRESSION: NG tube in good position in the body region the stomach. Moderate air throughout the bowel may suggest a diffuse ileus. Electronically Signed   By: Marijo Sanes M.D.   On: 02/05/2015 13:47    ASSESSMENT / PLAN:  PULMONARY Trach 11/11 >> A: Acute respiratory failure. Failure to wean from vent s/p trach. P: Pressure support wean to trach collar as tolerated  Change to pressure control as rest mode on 11/13 F/u CXR intermittently Remove sutures 11/18 Lasix 20 mg x1  CARDIOVASCULAR: Rt IJ CVL 11/04 >> A: Malignant pericardial effusion s/p window. Hypotension likely from hypovolemia and sedation >> improved. Hx of HLD, HTN. P: Resume outpatient lopressor at reduced dose.  Has been hypertensive at times but suspect related to agitation.  Continue zocor  Monitor hemodynamics Follow BP trend closely given cessation of steroids (pulse steroids, monitor for AI)  RENAL A:  Hypokalemia. Hypernatremia. P: Replace electrolytes as needed Continue free water, 250 Q6 D5 1/2NS @ 75 ml/hr - for hydration with concern for TLS and hypernatremia  GASTROENTEROLOGY:  A:  Nutrition. Gastroparesis. P:  Tube feeding per Nutrition  Monitor abdominal exam (note moderate air on KUB 11/14 for NGT placement) Zantac for SUP Reglan d/c'd 11/13  HEMATOLOGY/ONCOLOGY: A: B cell lymphoma. Anemia of critical illness. P: Chemotherapy (R-CHOP) >> using solumedrol in place of prednisone. ONC notes reflect high dose steroid need completed 11/15.     Allopurinol for tumor lysis syndrome prophylaxis F/u CBC Lovenox for DVT prevention  ID:  A: Fever, tracheobronchitis. P: Day 8/8 of Abx, changed to rocephin 11/12 D/C abx 11/16   Blood 11/09 >> neg Sputum 11/09 >> Moderate Streptococcus, beta hemolytic  ENDOCRINE: A: Steroid induced hyperglycemia. P: SSI  Increase levemir to 30 units - may need to reduce as steroids are reduced  NEUROLOGY:  A:  Agitation. Anxiety P: RASS goal 0 PRN ativan Precedex gtt off 11/15 Resume home  ativan per tube (0.5 TID) Fentanyl gtt, attempt to transition to PRN pushes (pt too sedate 11/16)   RHEUMATOLOGY: A: Hx of RA >> was on MTX as outpt. P: Will need to re-assess as outpt    Noe Gens, NP-C [facility] Pgr: [phone] or if no answer [phone] 02/07/2015, 8:10 AM

## 2015-02-07 NOTE — Progress Notes (Signed)
Patient is currently still on wean; PSV/CPAP 10/+5. Patient RR 14-16 and Vt 500-560. Patient vitals are stable and patient is not showing any signs of distress. RT will continue to monitor patient.

## 2015-02-07 NOTE — Progress Notes (Signed)
[facility]   HEMATOLOGY/ONCOLOGY INPATIENT PROGRESS NOTE  Date of Service: 02/07/2015  Inpatient Attending: .Chesley Mires, MD   SUBJECTIVE  Ms Armes quite awake on vent this evening. As per notes was able to tolerate pressure support weaning throughtout the day today. Her left upper extremity swelling is being evaluated for DVT with an [place] today. Hypernatremia improving. Off Steroids (from lymphoma standpoint). LFts being monitored. Decreased dose of allopurinol further. No fevers/chills.  OBJECTIVE:  PHYSICAL EXAMINATION: . Filed Vitals:   02/07/15 2100 02/07/15 2146 02/07/15 2200 02/07/15 2316  BP: 186/86 186/86 156/52 168/73  Pulse: 93 123 96 86  Temp: 99.1 F (37.3 C)  99.3 F (37.4 C)   TempSrc: Core (Comment)     Resp: 23  14 14   Height:      Weight:      SpO2: 97%  95% 96%   Filed Weights   02/02/15 0500 02/03/15 0319 02/04/15 1600  Weight: 209 lb 14.1 oz (95.2 kg) 203 lb 11.3 oz (92.4 kg) 212 lb 8.4 oz (96.4 kg)   .Body mass index is 36.46 kg/(m^2).   GENERAL no acute distress, trach in situ SKIN: no acute rashes EYES: making eye contact OROPHARYNX: oral mucosa moist. NECK: supple, thyroid normal size, non-tender, without nodularity LYMPH: no palpable lymphadenopathy in the cervical, axillary or inguinal area LUNGS: Distant breath , decreased air entry LL>>Rt HEART: regular rate & rhythm and no murmurs and trace pedal edema ABDOMEN:soft, non-tender and normal bowel sounds, no hepatosplenomegaly. NEURO: appears awake and alert. EXT- LUE swelling noted.  MEDICAL HISTORY:  Past Medical History  Diagnosis Date  . High cholesterol   . Hypertension   . Acid reflux   . Insomnia   . Scoliosis   . Asthma     "stopped in the 1990's"  . Anemia   . Peptic ulcer   . Daily headache   . Rheumatoid arthritis ([place])     "everywhere" (02/19/2015)  . Chronic lower back pain     "mostly on the right" (02/15/2015)  . Anxiety   . Melanoma ([place]) 11/2011    "left forearm"  .  Diffuse large B cell lymphoma ([place])     SURGICAL HISTORY: Past Surgical History  Procedure Laterality Date  . Appendectomy    . Total knee arthroplasty Bilateral   . Joint replacement    . Total abdominal hysterectomy    . Melanoma excision Left 11/2011    "forearm"  . Lymph node biopsy      "around the back of neck"  . Axillary lymph node biopsy Left   . Inguinal lymph node biopsy Left   . Pericardial window N/A 02/06/2015    Procedure: PERICARDIAL WINDOW;  Surgeon: Ivin Poot, MD;  Location: [facility];  Service: Thoracic;  Laterality: N/A;  . Mediastinotomy chamberlain mcneil Left 01/23/2015    Procedure: MEDIASTINOTOMY CHAMBERLAIN MCNEIL;  Surgeon: Ivin Poot, MD;  Location: [facility] OR;  Service: Thoracic;  Laterality: Left;    SOCIAL HISTORY: Social History   Social History  . Marital Status: Married    Spouse Name: N/A  . Number of Children: N/A  . Years of Education: N/A   Occupational History  . Not on file.   Social History Main Topics  . Smoking status: Never Smoker   . Smokeless tobacco: Never Used  . Alcohol Use: No  . Drug Use: No  . Sexual Activity: Not on file   Other Topics Concern  . Not on file   Social  History Narrative    FAMILY HISTORY: Family History  Problem Relation Age of Onset  . Cancer Sister 69    BREAST    ALLERGIES:  is allergic to morphine and related and norco.  MEDICATIONS:  Scheduled Meds: . allopurinol  150 mg Per Tube BID  . antiseptic oral rinse  7 mL Mouth Rinse QID  . bisacodyl  10 mg Oral Daily  . chlorhexidine gluconate  15 mL Mouth Rinse BID  . enoxaparin (LOVENOX) injection  40 mg Subcutaneous Daily  . feeding supplement (PRO-STAT SUGAR FREE 64)  60 mL Per Tube TID  . feeding supplement (VITAL HIGH PROTEIN)  1,000 mL Per Tube Q24H  . free water  250 mL Per Tube Q6H  . insulin aspart  0-20 Units Subcutaneous 6 times per day  . insulin detemir  30 Units Subcutaneous Daily  . LORazepam  0.5 mg Oral 3 times per  day  . metoprolol tartrate  25 mg Oral BID  . ranitidine  150 mg Oral BID  . senna-docusate  1 tablet Oral QHS   Continuous Infusions: . dextrose 5 % and 0.45% NaCl 75 mL/hr at 02/07/15 0633   PRN Meds:.albuterol, alteplase, diphenhydrAMINE, diphenhydrAMINE, EPINEPHrine, EPINEPHrine, EPINEPHrine, EPINEPHrine, famotidine, fentaNYL (SUBLIMAZE) injection, fentaNYL (SUBLIMAZE) injection, heparin lock flush, heparin lock flush, LORazepam, methylPREDNISolone sodium succinate, ondansetron (ZOFRAN) IV, sodium chloride, sodium chloride, sodium chloride, sodium chloride  REVIEW OF SYSTEMS:    10 Point review of Systems was done is negative except as noted above.   LABORATORY DATA:  I have reviewed the data as listed  . CBC Latest Ref Rng 02/07/2015 02/06/2015 02/05/2015  WBC 4.0 - 10.5 K/uL 8.8 7.7 4.5  Hemoglobin 12.0 - 15.0 g/dL 9.1(L) 9.8(L) 9.3(L)  Hematocrit 36.0 - 46.0 % 29.8(L) 32.1(L) 30.6(L)  Platelets 150 - 400 K/uL 282 355 295    . CMP Latest Ref Rng 02/07/2015 02/06/2015 02/05/2015  Glucose 65 - 99 mg/dL 161(H) 270(H) 260(H)  BUN 6 - 20 mg/dL 42(H) 52(H) 71(H)  Creatinine 0.44 - 1.00 mg/dL 0.33(L) 0.45 0.55  Sodium 135 - 145 mmol/L 148(H) 152(H) 155(H)  Potassium 3.5 - 5.1 mmol/L 4.3 4.2 4.5  Chloride 101 - 111 mmol/L 113(H) 116(H) 117(H)  CO2 22 - 32 mmol/L 32 31 32  Calcium 8.9 - 10.3 mg/dL 8.6(L) 9.0 9.0  Total Protein 6.5 - 8.1 g/dL 4.8(L) 5.5(L) 5.2(L)  Total Bilirubin 0.3 - 1.2 mg/dL 2.7(H) 2.4(H) 1.3(H)  Alkaline Phos 38 - 126 U/L 81 106 114  AST 15 - 41 U/L 26 35 16  ALT 14 - 54 U/L 38 38 19     RADIOGRAPHIC STUDIES: I have personally reviewed the radiological images as listed and agreed with the findings in the report. Ct Abdomen Pelvis Wo Contrast  02/01/2015  CLINICAL DATA:  Diffuse large B-cell lymphoma undergoing chemotherapy. EXAM: CT ABDOMEN AND PELVIS WITHOUT CONTRAST TECHNIQUE: Multidetector CT imaging of the abdomen and pelvis was performed  following the standard protocol without IV contrast. COMPARISON:  Recent chest CT 01/19/2015 FINDINGS: Examination is degraded by motion. Lower chest: There is air in the left chest wall likely from recent biopsy/surgery. The left-sided chest tube is in place. Extensive left lower lobe airspace consolidation which could reflect atelectasis or pneumonia. Small bilateral pleural effusions. Right basilar atelectasis. There is an NG tube coursing down the esophagus and into the stomach. Bulky mediastinal lymphadenopathy again demonstrated. Coronary artery calcifications are noted. Hepatobiliary: No focal hepatic lesions except for a small cyst in the  right hepatic lobe inferiorly. No evidence of metastatic disease without IV contrast. The gallbladder is grossly normal. No common bile duct dilatation. Pancreas: No mass, inflammation or ductal dilatation. Spleen: Normal size.  No definite lesions. Adrenals/Urinary Tract: The adrenal glands are normal. Both kidneys are normal except for a right renal calculus. No hydronephrosis. Stomach/Bowel: The stomach contains an NG tube. The duodenum, small bowel and colon are unremarkable. No inflammatory changes, mass lesions or obstructive findings. A rectal catheter is in place. Vascular/Lymphatic: No mesenteric or retroperitoneal mass or adenopathy. Small scattered lymph nodes are noted. There are moderate to advanced aortic calcifications but no focal aneurysm. Other: No pelvic mass or adenopathy. No free pelvic fluid collections. A Foley catheter is noted in the bladder. The uterus is surgically absent. No inguinal mass or adenopathy. Musculoskeletal: No significant bony findings. Degenerative changes involving the spine with severe facet disease. There is a remote appearing compression fracture of L1. IMPRESSION: 1. Left-sided chest tube in place with a small left pleural effusion. Significant left lower lobe airspace consolidation suggesting pneumonia or dense atelectasis. 2.  Bulky mediastinal lymphadenopathy. 3. No adenopathy in the abdomen or pelvis. 4. No acute abdominal/pelvic findings. Electronically Signed   By: Marijo Sanes M.D.   On: 02/01/2015 21:33   Dg Chest 2 View  01/17/2015  CLINICAL DATA:  Cough and shortness of breath for the past several days EXAM: CHEST  2 VIEW COMPARISON:  None in PACs FINDINGS: There is an abnormal soft tissue mass arising from the left hilar and suprahilar regions adjacent to the AP window. The aortic knob remains reasonably well demonstrated. The cardiac silhouette is enlarged. The pulmonary vascularity is not engorged. The lungs are well-expanded. There is no focal infiltrate nor significant pleural effusion. There is moderate dextrocurvature of the thoracic spine centered at approximately T8. IMPRESSION: Large abnormal anterior mediastinal mass. This may be neoplastic or vascular in nature. CT scanning of the chest now is recommended. These results will be called to the ordering clinician or representative by the Radiologist Assistant, and communication documented in the PACS or zVision Dashboard. Electronically Signed   By: David  Martinique M.D.   On: 01/17/2015 11:15   Ct Chest W Contrast  01/19/2015  CLINICAL DATA:  Cough, shortness of breath, possible chest mass. EXAM: CT CHEST WITH CONTRAST TECHNIQUE: Multidetector CT imaging of the chest was performed during intravenous contrast administration. CONTRAST:  9mL ISOVUE-300 IOPAMIDOL (ISOVUE-300) INJECTION 61% COMPARISON:  January 17, 2015. FINDINGS: No pneumothorax or pleural effusion is noted. 5 mm nodule is noted posteriorly in the right lower lobe best seen on image number 27 of series 4. Atherosclerosis of thoracic aorta is noted without aneurysm or dissection. Great vessels are widely patent. Mild coronary artery calcifications are noted. Moderate pericardial effusion is noted. Visualized portion of upper abdomen is unremarkable. Abnormal soft tissue density is noted in the anterior  mediastinum that measures 12.0 x 8.8 cm. This is noted to compress a portion of the left innominate vein. This is most consistent with adenopathy consistent with lymphoma or metastatic disease. No significant osseous abnormality is noted. IMPRESSION: 12.0 x 8.8 cm abnormal soft tissue density seen in anterior mediastinum concerning for adenopathy related to lymphoma or other metastatic disease. Some compression of the left innominate vein is noted as a result. Moderate pericardial effusion is noted as well. 5 mm nodule is noted posteriorly in the right lower lobe. This may represent metastatic focus given above finding. Alternatively, it may be unrelated. Continued CT follow-up  is recommended to ensure stability. These results will be called to the ordering clinician or representative by the Radiologist Assistant, and communication documented in the PACS or zVision Dashboard. Electronically Signed   By: Marijo Conception, M.D.   On: 01/19/2015 12:07   Dg Chest Port 1 View  02/07/2015  CLINICAL DATA:  Respiratory failure.  Mediastinal mass. EXAM: PORTABLE CHEST 1 VIEW COMPARISON:  Radiographs from 02/02/2015 through 02/06/2015 FINDINGS: Tracheostomy tube, NG tube and central catheter appear unchanged. Slight increased atelectasis at the right base. Persistent atelectasis at the left base. Pulmonary vascularity is normal. IMPRESSION: Slight increased atelectasis at the right base. Persistent atelectasis on the left. Electronically Signed   By: Lorriane Shire M.D.   On: 02/07/2015 07:42   Dg Chest Port 1 View  02/06/2015  CLINICAL DATA:  Respiratory failure EXAM: PORTABLE CHEST 1 VIEW COMPARISON:  Yesterday FINDINGS: Tubular devices are stable. Airspace disease throughout the left lung and at the right base is worse. Heart remains enlarged. Stable vascular congestion. Less aeration in yesterday. IMPRESSION: Worsening bilateral airspace disease left greater than right. Electronically Signed   By: Marybelle Killings M.D.    On: 02/06/2015 07:41   Dg Chest Port 1 View  02/05/2015  CLINICAL DATA:  Respiratory failure. Mediastinal mass with pericardial effusion. EXAM: PORTABLE CHEST 1 VIEW COMPARISON:  Radiographs dated 02/03/2015 and 02/04/2015 FINDINGS: Central line, NG tube, and tracheostomy tube appear in good position, unchanged. There is new slight pulmonary vascular congestion. No change in slight atelectasis in left lung. Mediastinal mass is obscured due to rotation. Right lung is clear of infiltrates and atelectasis. IMPRESSION: New slight pulmonary vascular prominence. Slight atelectasis in the left midzone, stable. Electronically Signed   By: Lorriane Shire M.D.   On: 02/05/2015 07:24   Dg Chest Port 1 View  02/04/2015  CLINICAL DATA:  69 year old female with respiratory failure and mediastinal mass. EXAM: PORTABLE CHEST 1 VIEW COMPARISON:  02/03/2015 and prior radiograph FINDINGS: Tracheostomy tube, right IJ central venous catheter with tip overlying the lower SVC, and NG tube with tip overlying the proximal -mid stomach again noted. A large left mediastinal mass is again noted. The cardiomediastinal silhouette is unchanged. There is no evidence of pneumothorax. Continued left basilar atelectasis noted. IMPRESSION: Little significant change. Left mediastinal mass with left basilar atelectasis Electronically Signed   By: Margarette Canada M.D.   On: 02/04/2015 07:42   Dg Chest Port 1 View  02/03/2015  CLINICAL DATA:  Respiratory distress. Patient with a tracheostomy. Subsequent encounter. EXAM: PORTABLE CHEST 1 VIEW COMPARISON:  02/02/2015 FINDINGS: Masslike opacity obscures the left hilum and left mediastinum merging with the left heart border. This is without significant change. No convincing pulmonary edema. Probable atelectasis at the left lung base. No pneumothorax or obvious pleural effusion. Tracheostomy tube and right internal jugular central venous line are stable and well positioned. New orogastric tube passes  below the diaphragm into the stomach. IMPRESSION: 1. No acute findings in the lungs. 2. Stable masslike opacity along the anterior mediastinum. 3. New orogastric tube is well positioned. Tracheostomy tube and right internal jugular central venous line are stable and also well positioned. Electronically Signed   By: Lajean Manes M.D.   On: 02/03/2015 08:34   Dg Chest Port 1 View  02/02/2015  CLINICAL DATA:  Tracheostomy tube placement. EXAM: PORTABLE CHEST 1 VIEW COMPARISON:  Earlier film 02/02/2015. FINDINGS: The tracheostomy tube is in good position with the tip at the mid tracheal level. The right  IJ catheter is stable. Stable mediastinal adenopathy/mass and persistent left lower lobe infiltrate/atelectasis/effusion. IMPRESSION: Tracheostomy tube in good position without complicating features. Electronically Signed   By: Marijo Sanes M.D.   On: 02/02/2015 12:18   Dg Chest Port 1 View  02/02/2015  CLINICAL DATA:  Shortness of breath. EXAM: PORTABLE CHEST 1 VIEW COMPARISON:  02/01/2015 FINDINGS: Examination is partially limited by leftward patient rotation. Endotracheal tube terminates above the level of the thoracic inlet and has likely been slightly retracted in the interim. Enteric tube courses towards the left upper abdomen with tip not imaged. Right jugular central venous catheter remains, projecting over the SVC. Known, large mediastinal mass is again seen. Cardiac silhouette remains enlarged. Left chest tube remains in place. No pneumothorax is identified. Left basilar opacities have slightly improved. Mild right basilar opacity is unchanged may reflect atelectasis. IMPRESSION: 1. Mild interval retraction of the endotracheal tube which projects above the thoracic inlet. Consider advancement. 2. Minimally improved aeration of the left lung base. No pneumothorax. Electronically Signed   By: Logan Bores M.D.   On: 02/02/2015 07:18   Dg Chest Port 1 View  02/01/2015  CLINICAL DATA:  Pericardial  window.  Pericardial effusion. EXAM: PORTABLE CHEST 1 VIEW COMPARISON:  01/31/2015 FINDINGS: Endotracheal tube in good position. NG tube in place with the tip not visualized due to underpenetration. Right jugular central venous catheter tip in the SVC. No pneumothorax on the right Interval improvement in aeration of the left lung with decrease in density left lower lobe. This may be due to decreased diffusion atelectasis. Left chest tube remains in place. No pneumothorax on the left. Cardiac enlargement has improved. Right lower lobe atelectasis is present. IMPRESSION: Endotracheal tube in good position. Decreased left lower lobe density with improved aeration of the left lung. Left chest tube in place without pneumothorax. Electronically Signed   By: Franchot Gallo M.D.   On: 02/01/2015 07:16   Dg Chest Port 1 View  01/31/2015  CLINICAL DATA:  Status post median sternotomy and pericardial window creation for pleural effusion, acute respiratory failure, cardiac tamponade, mediastinal mass. EXAM: PORTABLE CHEST 1 VIEW COMPARISON:  Portable chest x-ray of January 27, 2015 FINDINGS: The trachea has apparently been extubated. There is linear radiodensity that projects over the lower neck which could be a portion of the endotracheal tube but if so it is positioned quite high. The right lung remains well-expanded and clear. On the left only a small amount of aerated lung persists in the apex and laterally. The left chest tube is stable. The large mediastinal mass is unchanged. There is no pneumothorax nor definite pleural effusion. The esophagogastric tube tip projects below the inferior margin of the image. The right internal jugular venous catheter tip projects over the midportion of the SVC. IMPRESSION: Stable appearance of the chest following extubation of the trachea. Persistent large mediastinal mass. There is no pneumothorax. Electronically Signed   By: David  Martinique M.D.   On: 01/31/2015 07:33   Dg Chest Port  1 View  01/30/2015  CLINICAL DATA:  Persistent fever. Status post pericardial window and mediastinal biopsy. EXAM: PORTABLE CHEST 1 VIEW COMPARISON:  Multiple chest x-rays and chest CT from 01/19/2015 FINDINGS: The need support apparatus is stable. The endotracheal tube is 4.3 cm above the carina. The NG tube is stable. The left-sided chest tube is stable. Persistent large mediastinal mass occupying good portion of the left hemi thorax. Persistent vascular congestion and bibasilar atelectasis. IMPRESSION: Stable support apparatus. Stable left-sided  chest tube.  No definite pneumothorax. Stable large mass occupying a good portion of the left hemi thorax. Electronically Signed   By: Marijo Sanes M.D.   On: 01/30/2015 23:30   Dg Chest Port 1 View  01/30/2015  CLINICAL DATA:  Intubated patient, acute respiratory failure and hypoxia, history of mediastinal mass, pericardial effusion and cardiac tamponade EXAM: PORTABLE CHEST 1 VIEW COMPARISON:  Portable chest x-ray of January 29, 2015. FINDINGS: Stable volume loss on the left with large mediastinal mass. The left-sided chest tubes are unchanged in position. There is no pneumothorax nor large pleural effusion. The right lung is well-expanded. Mild interstitial prominence throughout the right lung is stable. The pulmonary vascularity is not engorged. The endotracheal tube tip lies 3.8 cm above the carina. The right internal jugular venous catheter tip projects over the midportion of the SVC. The esophagogastric tube tip projects below the inferior margin of the image. IMPRESSION: There has been no significant change in the appearance of the chest since yesterday's study. The support tubes are in reasonable position. Electronically Signed   By: David  Martinique M.D.   On: 01/30/2015 07:21   Dg Chest Port 1 View  01/29/2015  CLINICAL DATA:  Shortness of breath, acute respiratory failure, intubated patient, history of mediastinal mass, pericardial effusion, and cardiac  tamponade. EXAM: PORTABLE CHEST 1 VIEW COMPARISON:  Portable chest x-ray of January 28, 2015 FINDINGS: The right lung is well-expanded. There is a trace of pleural fluid at the right lung base. On the left the cardiac silhouette occupies much of the hemi thorax. A small amount of aerated lung in the apex and inferior laterally is still demonstrated. The endotracheal tube tip projects approximately 3.7 cm above the carina. The esophagogastric tube tip projects below the inferior margin of the image. A left-sided chest tube has its tip located over the lateral aspect of the approximately 6 rib and is stable. A second tube versus the tip of the esophagogastric tube is noted medially at the left lung base. IMPRESSION: Persistent large mediastinal mass on the left. Slight interval worsening in volume loss on the left. The cardiac silhouette remains enlarged. No pneumothorax nor large left pleural effusion is observed. The right lung is largely clear. Electronically Signed   By: David  Martinique M.D.   On: 01/29/2015 07:25   Dg Chest Port 1 View  01/28/2015  CLINICAL DATA:  ET tube placement EXAM: PORTABLE CHEST 1 VIEW COMPARISON:  01/27/15 FINDINGS: The ET tube tip is stable above the carina. There is a right IJ catheter with tip in the projection of the SVC. Left-sided chest tube is identified. No pneumothorax. Stable cardiac enlargement and stable large anterior mediastinal mass. No change in small bilateral pleural effusions and pulmonary edema consistent with CHF. IMPRESSION: 1. Left chest tube in place without visible pneumothorax. 2. Large anterior mediastinal mass 3. Bilateral pleural effusions and pulmonary edema. Electronically Signed   By: Kerby Moors M.D.   On: 01/28/2015 08:28   Dg Chest Port 1 View  01/27/2015  CLINICAL DATA:  Pericardial effusion EXAM: PORTABLE CHEST 1 VIEW COMPARISON:  02/07/2015 FINDINGS: ET tube tip is stable above the carina. There is a right IJ catheter with tip in the projection  of the SVC. Left chest tube is in place. No pneumothorax. NG tube tip is in the stomach. Cardiac enlargement and large anterior mediastinal mass is again noted. Unchanged from previous exam. Increase in pleural fluid noted bilaterally. IMPRESSION: 1. Stable support apparatus. 2. Left chest  tube without pneumothorax. 3. Increase in bilateral pleural effusions. Electronically Signed   By: Kerby Moors M.D.   On: 01/27/2015 09:00   Dg Chest Portable 1 View  02/12/2015  CLINICAL DATA:  Status post pericardial window and Chamberlain procedure for large mediastinal mass with associated pericardial effusion. EXAM: PORTABLE CHEST 1 VIEW COMPARISON:  CT of the chest on 01/19/2015 FINDINGS: Endotracheal tube present with the tip approximately 2.5 cm above the carina. Right jugular central line has been placed with the catheter tip in the SVC. Pericardial drain and left-sided chest tube present. Nasogastric tube extends into the distal esophagus and does not extend below the diaphragm. No pneumothorax identified. Lung volumes are very low bilaterally. Huge mediastinal mass again visualized which is more prominent to the left of midline. IMPRESSION: 1. No evidence of pneumothorax postoperatively. 2. Low lung volumes. 3. Nasogastric tube tip lies in the distal esophagus. Electronically Signed   By: Aletta Edouard M.D.   On: 02/02/2015 14:30   Dg Abd Portable 1v  02/05/2015  CLINICAL DATA:  NG tube placement. EXAM: PORTABLE ABDOMEN - 1 VIEW COMPARISON:  CT scan 02/01/2015 FINDINGS: The NG tube is in the body region of the stomach. Scattered air in the small bowel and colon along with some contrast in the colon could suggest a mild ileus. No free air. Bibasilar atelectasis. IMPRESSION: NG tube in good position in the body region the stomach. Moderate air throughout the bowel may suggest a diffuse ileus. Electronically Signed   By: Marijo Sanes M.D.   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 LDH level 950. 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 Cytogenetics and FISH studies pending. Low CD20 positivity which can be seen about 16-20% of diffuse large B-cell lymphoma is and could potentially portend a poor prognosis independent of the IPI index. (Blood 2009 [id number] Hilaria Ota et al.) Cannot r/o a double hit lymphoma - FISH for BCL2 and cMYC pending results. Blood counts stable. On presentation had difficulty with breathing due to airway compression. 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 Decreasing LDH levels suggestive of response to treatment. Patient had better weaning trials today. S/p first cycle of CHOP on 02/02/2015 and Rituxan 02/05/2015. Plan  -continue to monitor cbc daily. Would consider G-CSF if she became neutropenic -ongoing weaning trials as per pulmonary/CCM team - appreciate excellent cares -off steroids from chemotherapy standpoint - might help her agitation. -continue Allopurinol for TLS prophylaxis will reduce dose to 150mg  po daily due to bump in LFts -if double hit lymphoma or even based on high risk features might consider EPOCH-R from cycle 2 depending on how patient does. -Will need outpatient PET/CT scan. -We'll continue to follow daily  #2 ?HCAP/ventilator acquired pneumonia - elevated procalcitonin --- improving. ?underlying sleep apnea - given body habitus. Might have lower O2 sats at baseline. -antibiotics/weaning trials and vent management as per critical care team.  #3 hypernatremia likely due to free water deficit .sodium levels improving.  Intake/Output Summary (Last 24 hours) at 02/07/15 2342 Last data filed at  02/07/15 2200  Gross per 24 hour  Intake 3523.4 ml  Output   4165 ml  Net -641.6 ml   Fluid and electrolyte management as  per critical care team.  #4 Previous history of upper extremity melanoma status post resection couple of years ago. Family notes that she was followed at [facility] with repeat scans that showed no evidence of metastatic disease or recurrence. She was last evaluated about a year ago.  #5 history of rheumatoid arthritis previously on methotrexate  #6 Hyperglycemia due to steroids ?h/o DM2. Improving control off steroids.  #7 LUE swelling r/o DVT. -[place] LUE pending.  Oncology will continue to follow daily.  Plz call if any additional questions.  Sullivan Lone MD [place] AAHIVMS [facility] Hematology/Oncology Physician [facility]  (Office):       [phone] (Work cell):  [phone] (Fax):           [phone]

## 2015-02-08 ENCOUNTER — Inpatient Hospital Stay (HOSPITAL_COMMUNITY): Payer: Medicare (Managed Care)

## 2015-02-08 DIAGNOSIS — I82409 Acute embolism and thrombosis of unspecified deep veins of unspecified lower extremity: Secondary | ICD-10-CM

## 2015-02-08 DIAGNOSIS — I82402 Acute embolism and thrombosis of unspecified deep veins of left lower extremity: Secondary | ICD-10-CM

## 2015-02-08 LAB — CBC
HEMATOCRIT: 31.7 % — AB (ref 36.0–46.0)
HEMOGLOBIN: 9.7 g/dL — AB (ref 12.0–15.0)
MCH: 30.5 pg (ref 26.0–34.0)
MCHC: 30.6 g/dL (ref 30.0–36.0)
MCV: 99.7 fL (ref 78.0–100.0)
Platelets: 269 10*3/uL (ref 150–400)
RBC: 3.18 MIL/uL — ABNORMAL LOW (ref 3.87–5.11)
RDW: 15.1 % (ref 11.5–15.5)
WBC: 6.2 10*3/uL (ref 4.0–10.5)

## 2015-02-08 LAB — HEPATIC FUNCTION PANEL
ALK PHOS: 85 U/L (ref 38–126)
ALT: 57 U/L — ABNORMAL HIGH (ref 14–54)
AST: 28 U/L (ref 15–41)
Albumin: 2.1 g/dL — ABNORMAL LOW (ref 3.5–5.0)
BILIRUBIN DIRECT: 1.1 mg/dL — AB (ref 0.1–0.5)
BILIRUBIN INDIRECT: 0.9 mg/dL (ref 0.3–0.9)
BILIRUBIN TOTAL: 2 mg/dL — AB (ref 0.3–1.2)
TOTAL PROTEIN: 4.5 g/dL — AB (ref 6.5–8.1)

## 2015-02-08 LAB — BASIC METABOLIC PANEL
Anion gap: 6 (ref 5–15)
BUN: 30 mg/dL — AB (ref 6–20)
CHLORIDE: 103 mmol/L (ref 101–111)
CO2: 35 mmol/L — AB (ref 22–32)
Calcium: 8.6 mg/dL — ABNORMAL LOW (ref 8.9–10.3)
Creatinine, Ser: <0.3 mg/dL — ABNORMAL LOW (ref 0.44–1.00)
Glucose, Bld: 162 mg/dL — ABNORMAL HIGH (ref 65–99)
POTASSIUM: 4.3 mmol/L (ref 3.5–5.1)
SODIUM: 144 mmol/L (ref 135–145)

## 2015-02-08 LAB — GLUCOSE, CAPILLARY
GLUCOSE-CAPILLARY: 143 mg/dL — AB (ref 65–99)
GLUCOSE-CAPILLARY: 97 mg/dL (ref 65–99)
Glucose-Capillary: 163 mg/dL — ABNORMAL HIGH (ref 65–99)
Glucose-Capillary: 175 mg/dL — ABNORMAL HIGH (ref 65–99)
Glucose-Capillary: 191 mg/dL — ABNORMAL HIGH (ref 65–99)
Glucose-Capillary: 215 mg/dL — ABNORMAL HIGH (ref 65–99)
Glucose-Capillary: 142 mg/dL — ABNORMAL HIGH (ref 65–99)

## 2015-02-08 LAB — HEPARIN LEVEL (UNFRACTIONATED): Heparin Unfractionated: 0.59 IU/mL (ref 0.30–0.70)

## 2015-02-08 LAB — MAGNESIUM: MAGNESIUM: 2.1 mg/dL (ref 1.7–2.4)

## 2015-02-08 LAB — LACTATE DEHYDROGENASE: LDH: 407 U/L — AB (ref 98–192)

## 2015-02-08 LAB — PROCALCITONIN: Procalcitonin: 0.18 ng/mL

## 2015-02-08 MED ORDER — METOCLOPRAMIDE HCL 5 MG/ML IJ SOLN
5.0000 mg | Freq: Three times a day (TID) | INTRAMUSCULAR | Status: AC
  Administered 2015-02-08 – 2015-02-09 (×4): 5 mg via INTRAVENOUS
  Filled 2015-02-08 (×4): qty 2

## 2015-02-08 MED ORDER — FUROSEMIDE 10 MG/ML IJ SOLN
40.0000 mg | Freq: Once | INTRAMUSCULAR | Status: AC
Start: 2015-02-08 — End: 2015-02-08
  Administered 2015-02-08: 40 mg via INTRAVENOUS
  Filled 2015-02-08: qty 4

## 2015-02-08 MED ORDER — POTASSIUM CHLORIDE 20 MEQ/15ML (10%) PO SOLN
40.0000 meq | Freq: Once | ORAL | Status: AC
  Administered 2015-02-08: 40 meq
  Filled 2015-02-08: qty 30

## 2015-02-08 MED ORDER — VITAL HIGH PROTEIN PO LIQD: 1000.0000 mL | ORAL | Status: DC

## 2015-02-08 MED ORDER — MIDAZOLAM HCL 2 MG/2ML IJ SOLN
INTRAMUSCULAR | Status: AC
  Administered 2015-02-08: 2 mg
  Filled 2015-02-08: qty 2

## 2015-02-08 MED ORDER — METOPROLOL TARTRATE 25 MG PO TABS
50.0000 mg | ORAL_TABLET | Freq: Two times a day (BID) | ORAL | Status: DC
  Administered 2015-02-08 (×2): 50 mg via ORAL
  Filled 2015-02-08 (×2): qty 2

## 2015-02-08 MED ORDER — VITAL HIGH PROTEIN PO LIQD
1000.0000 mL | ORAL | Status: DC
  Administered 2015-02-09: 1000 mL
  Filled 2015-02-08: qty 1000

## 2015-02-08 MED ORDER — LORAZEPAM 2 MG/ML IJ SOLN
0.5000 mg | INTRAMUSCULAR | Status: DC | PRN
  Administered 2015-02-08 – 2015-02-14 (×11): 0.5 mg via INTRAVENOUS
  Filled 2015-02-08 (×12): qty 1

## 2015-02-08 MED ORDER — FUROSEMIDE 10 MG/ML IJ SOLN
40.0000 mg | Freq: Once | INTRAMUSCULAR | Status: AC
  Administered 2015-02-08: 40 mg via INTRAVENOUS
  Filled 2015-02-08: qty 4

## 2015-02-08 MED ORDER — HEPARIN (PORCINE) IN NACL 100-0.45 UNIT/ML-% IJ SOLN
1300.0000 [IU]/h | INTRAMUSCULAR | Status: DC
  Administered 2015-02-08 – 2015-02-10 (×2): 1300 [IU]/h via INTRAVENOUS
  Filled 2015-02-08 (×4): qty 250

## 2015-02-08 NOTE — Progress Notes (Signed)
ANTICOAGULATION CONSULT NOTE - Initial Consult  Pharmacy Consult for heparin Indication: DVT  Allergies  Allergen Reactions  . Morphine And Related Hives and Itching  . Norco [Hydrocodone-Acetaminophen] Hives and Itching    Patient Measurements: Height: 5\' 4"  (162.6 cm) Weight: 212 lb 8.4 oz (96.4 kg) IBW/kg (Calculated) : 54.7 Heparin Dosing Weight: 77kg  Vital Signs: Temp: 100.8 F (38.2 C) (11/17 1205) Temp Source: Core (Comment) (11/17 1205) BP: 175/108 mmHg (11/17 1100) Pulse Rate: 107 (11/17 1100)  Labs:  Recent Labs  02/06/15 0556 02/07/15 0430 02/08/15 0505  HGB 9.8* 9.1* 9.7*  HCT 32.1* 29.8* 31.7*  PLT 355 282 269  CREATININE 0.45 0.33* <0.30*    CrCl cannot be calculated (Patient has no serum creatinine result on file.).   Medical History: Past Medical History  Diagnosis Date  . High cholesterol   . Hypertension   . Acid reflux   . Insomnia   . Scoliosis   . Asthma     "stopped in the 1990's"  . Anemia   . Peptic ulcer   . Daily headache   . Rheumatoid arthritis ([place])     "everywhere" (02/04/2015)  . Chronic lower back pain     "mostly on the right" (01/27/2015)  . Anxiety   . Melanoma ([place]) 11/2011    "left forearm"  . Diffuse large B cell lymphoma (HCC)     Assessment: 25 YOF with large mediastinal mass and pericardial effusion, s/p pericardial window 11/4.  Biopsy revealed B-cell lymphoma s/p 1st cycle of R-CHOP.  She remains on ventilator with trach.  Noted to have swollen LUE 11/16. UE dopplers reveal DVT in internal jugular, subclavian, and proximal axillary veins. Enoxaparin 40mg  given at 10am today  11/4 aptt = 20sec, 11/11 INR = 1.33  Labs, 02/08/2015   CBC: Hgb = 9.7, pltc WNL  Goal of Therapy:  Heparin level 0.3-0.7 units/ml Monitor platelets by anticoagulation protocol: Yes   Plan:   Start Heparin gtt at 1300 units/hr (no bolus d/t somewhat recent pericardial window and enoxaparin dose 3h ago)  D/C enoxaparin  8h  heparin level  Daily CBC and heparin levels  Await long-term plans for anticoagulation  Doreene Eland, PharmD, BCPS.   Pager: [phone] 02/08/2015 1:08 PM

## 2015-02-08 NOTE — Progress Notes (Signed)
Fentanyl gtt discontinued, wasted 165 mL of Fentanyl in the sink. Witnessed by [employer]

## 2015-02-08 NOTE — Progress Notes (Signed)
PT Cancellation Note  Patient Details Name: Maria Bauer MRN: [id number] DOB: Apr 16, 1945   Cancelled Treatment:    Reason Eval/Treat Not Completed: Medical issues which prohibited therapy (RN reports  issues with BP.)   Claretha Cooper 02/08/2015, 11:11 AM Tresa Endo PT [phone]

## 2015-02-08 NOTE — Progress Notes (Signed)
Name: Maria Bauer MRN: [id number] DOB: 06/12/1945    ADMISSION DATE:  02/19/2015  CONSULTATION DATE:  02/19/2015  REFERRING MD :  Dr. 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  SIGNIFICANT EVENTS  11/03 Admit 11/04 To OR for pericardial window and mediastinal mass bx >> remains on vent post-op 11/08 Fever, started Abx 11/09 Off pressors 11/10 Oncology consulted 11/11 [name]); transfer to [facility] for chemo (R-CHOP) 11/13  Remains on sedation.  Tolerated pressure support. 11/14  Episode of SVT with suctioning overnight.  Did not tolerated PSV this am due to anxiety. 11/17  Weaned on PSV until 1700 yesterday, vomiting overnight x2.  LUE swollen ([place] pending).  STUDIES:  10/28 CT chest >> 12 cm anterior mediastinal mass, mod pericardial effusion 11/10 Mediastinal mass bx >> diffuse large B-cell lymphoma   SUBJECTIVE:   RN reports pt weaned on PSV until 1700 yesterday, vomiting overnight x2.  LUE swollen ([place] pending).  Net neg 600 from 20 mg lasix on 11/16.  ? Suctioning TF from [place]: BP 195/80 mmHg  Pulse 108  Temp(Src) 100.6 F (38.1 C) (Core (Comment))  Resp 19  Ht 5\' 4"  (1.626 m)  Wt 212 lb 8.4 oz (96.4 kg)  BMI 36.46 kg/m2  SpO2 100%   VENT SETTINGS: Vent Mode:  [-] PCV FiO2 (%):  [40 %] 40 % Set Rate:  [12 bmp] 12 bmp PEEP:  [5 cmH20] 5 cmH20 Pressure Support:  [5 cmH20-10 cmH20] 5 cmH20 Plateau Pressure:  [15 cmH20-21 cmH20] 19 cmH20   INTAKE/OUTPUT: I/O last 3 completed shifts: In: 5196.8 [I.V.:2896.8; NG/GT:2060; IV Piggyback:240] Out: 5030 [Urine:5030]  PHYSICAL EXAMINATION:  General: elderly female in NAD on vent, sedate  HENT: trach site clean, sutures intact  PULM: even/non-labored, diminished lower, otherwise few rhonchi CV: s1s2 rrr, no m/r/g, ST on monitor, suture site anterior chest c/d/i, second site with  sutures intact leaking yellow fluid GI: soft, non tender MSK: generalized edema, LUE swelling  Neuro: sedate, raises eyebrows to voice, generalized weakness  CBC Recent Labs     02/06/15  0556  02/07/15  0430  02/08/15  0505  WBC  7.7  8.8  6.2  HGB  9.8*  9.1*  9.7*  HCT  32.1*  29.8*  31.7*  PLT  355  282  269   Coag's No results for input(s): APTT, INR in the last 72 hours.  BMET Recent Labs     02/06/15  0556  02/07/15  0430  02/08/15  0505  NA  152*  148*  144  K  4.2  4.3  4.3  CL  116*  113*  103  CO2  31  32  35*  BUN  52*  42*  30*  CREATININE  0.45  0.33*  <0.30*  GLUCOSE  270*  161*  162*    Electrolytes Recent Labs     02/06/15  0556  02/07/15  0430  02/08/15  0505  CALCIUM  9.0  8.6*  8.6*  MG   --    --   2.1  PHOS  2.9   --    --    Sepsis Markers No results for input(s): PROCALCITON, O2SATVEN in the last 72 hours.  Invalid input(s): LACTICACIDVEN  ABG No results for input(s): PHART, PCO2ART, PO2ART in the last 72 hours.   Liver Enzymes  Recent Labs     02/06/15  0556  02/07/15  0430  AST  35  26  ALT  38  38  ALKPHOS  106  81  BILITOT  2.4*  2.7*  ALBUMIN  2.4*  2.1*   Glucose Recent Labs     02/07/15  1151  02/07/15  1556  02/07/15  1947  02/07/15  2335  02/08/15  0348  02/08/15  0437  GLUCAP  160*  191*  189*  160*  163*  175*    Imaging Dg Chest Port 1 View  02/08/2015  CLINICAL DATA:  Acute respiratory failure. EXAM: PORTABLE CHEST 1 VIEW COMPARISON:  February 07, 2015. FINDINGS: Tracheostomy and nasogastric tubes are unchanged in position. Stable cardiomegaly. Right internal jugular catheter is noted with distal tip in expected position of the SVC. No pneumothorax is noted. Mild central pulmonary vascular congestion is noted. Stable left perihilar and basilar opacity is noted concerning for atelectasis or pneumonia with possible associated pleural effusion. IMPRESSION: Stable support apparatus. Stable left perihilar and  basilar opacity is noted concerning for pneumonia or atelectasis with associated pleural effusion. Electronically Signed   By: Marijo Conception, M.D.   On: 02/08/2015 07:26   Dg Chest Port 1 View  02/07/2015  CLINICAL DATA:  Respiratory failure.  Mediastinal mass. EXAM: PORTABLE CHEST 1 VIEW COMPARISON:  Radiographs from 02/02/2015 through 02/06/2015 FINDINGS: Tracheostomy tube, NG tube and central catheter appear unchanged. Slight increased atelectasis at the right base. Persistent atelectasis at the left base. Pulmonary vascularity is normal. IMPRESSION: Slight increased atelectasis at the right base. Persistent atelectasis on the left. Electronically Signed   By: Lorriane Shire M.D.   On: 02/07/2015 07:42    ASSESSMENT / PLAN:  PULMONARY Trach 11/11 >> A: Acute respiratory failure. Failure to wean from vent s/p trach. Concern for aspiration - vomiting overnight 11/17, ? TF suctioned from trach  P: Pressure support wean to trach collar as tolerated  Change to pressure control as rest mode on 11/13 F/u CXR intermittently Remove trach sutures 11/18 Repeat lasix, 40 mg x1  CARDIOVASCULAR: Rt IJ CVL 11/04 >> A: Malignant pericardial effusion s/p window. Hypotension likely from hypovolemia and sedation >> improved. Hx of HLD, HTN. P: Resume outpatient lopressor at reduced dose.  Has been hypertensive at times but suspect related to agitation.  Continue zocor  Monitor hemodynamics Follow BP trend closely given cessation of steroids (pulse steroids, monitor for AI) Monitor incision sites   RENAL A:  Hypokalemia. Hypernatremia. P: Replace electrolytes as needed Continue free water, 250 Q6 D5 1/2NS @ 40 ml/hr Lasix 40 mg x1  GASTROENTEROLOGY:  A:  Nutrition. Gastroparesis. Nausea / Vomiting - new, 11/17.  2 episodes overnight, ? aspiration P:  Tube feeding per Nutrition  Monitor abdominal exam (note moderate air on KUB 11/14 for NGT placement) Zantac for SUP Repeat  reglan 5mg  IV Q8 x 4 doses (QT 0.29, QTc 0.41) Assess KUB now   HEMATOLOGY/ONCOLOGY: A: B cell lymphoma. Anemia of critical illness. LUE Swelling - r/o DVT  P: Chemotherapy (R-CHOP) >> completed 1st cycle 11/11, Rituxan 11/14 Previously used solu-medrol in place of prednisone  ONC notes reflect high dose steroid need completed 11/15.   Allopurinol for tumor lysis syndrome prophylaxis F/u CBC Lovenox for DVT prevention Assess LUE venous duplex   ID:  A: Fever, tracheobronchitis. P: Day 8/8 of Abx, changed to rocephin 11/12 D/C abx 11/16   Blood 11/09 >> neg Sputum 11/09 >> Moderate Streptococcus, beta  hemolytic  Assess PCT, monitor off abx (? Aspiration)  ENDOCRINE: A: Steroid induced hyperglycemia. P: SSI  Increase levemir to 30 units - may need to reduce as steroids are reduced  NEUROLOGY:  A:  Agitation. Anxiety P: RASS goal 0 PRN ativan Resume home ativan per tube (0.5 TID) PRN fentanyl for pain  PRN ativan for agitation    RHEUMATOLOGY: A: Hx of RA >> was on MTX as outpt. P: Will need to re-assess as outpt    Noe Gens, NP-C [facility] Pgr: [phone] or if no answer [phone] 02/08/2015, 8:26 AM

## 2015-02-08 NOTE — Progress Notes (Signed)
Date: February 08, 2015 Chart reviewed for concurrent status and case management needs. Will continue to follow patient for changes and needs: Remains on full vent support weaning x1 day and failed Velva Harman, Therapist, sports, BSN, [place]   [phone]

## 2015-02-08 NOTE — Progress Notes (Signed)
VASCULAR LAB PRELIMINARY  PRELIMINARY  PRELIMINARY  PRELIMINARY  Bilateral upper extremity venous duplex completed.    Preliminary report:  Right :  No evidence of DVT or superficial thrombosis.  Left:  DVT noted in he internal jugular, subclavian, and proximal axillary veins. No obvious evidence of superficial thrombosis of the left upper extremity. The left evaluation was technically limited due to patient becoming agitated and moving. Unable to visualize the left ulnar due to arm movement.  Aidan Caloca, RVS 02/08/2015, 11:06 AM

## 2015-02-08 NOTE — Progress Notes (Signed)
ANTICOAGULATION CONSULT NOTE - Initial Consult  Pharmacy Consult for heparin Indication: DVT  Allergies  Allergen Reactions  . Morphine And Related Hives and Itching  . Norco [Hydrocodone-Acetaminophen] Hives and Itching   Patient Measurements: Height: 5\' 4"  (162.6 cm) Weight: 212 lb 8.4 oz (96.4 kg) IBW/kg (Calculated) : 54.7 Heparin Dosing Weight: 77kg  Vital Signs: Temp: 100 F (37.8 C) (11/17 2000) Temp Source: Oral (11/17 2000) BP: 130/58 mmHg (11/17 2142) Pulse Rate: 129 (11/17 2142)  Labs:  Recent Labs  02/06/15 0556 02/07/15 0430 02/08/15 0505 02/08/15 2000  HGB 9.8* 9.1* 9.7*  --   HCT 32.1* 29.8* 31.7*  --   PLT 355 282 269  --   HEPARINUNFRC  --   --   --  0.59  CREATININE 0.45 0.33* <0.30*  --    CrCl cannot be calculated (Patient has no serum creatinine result on file.).  Medical History: Past Medical History  Diagnosis Date  . High cholesterol   . Hypertension   . Acid reflux   . Insomnia   . Scoliosis   . Asthma     "stopped in the 1990's"  . Anemia   . Peptic ulcer   . Daily headache   . Rheumatoid arthritis ([place])     "everywhere" (01/27/2015)  . Chronic lower back pain     "mostly on the right" (02/13/2015)  . Anxiety   . Melanoma ([place]) 11/2011    "left forearm"  . Diffuse large B cell lymphoma (HCC)    Assessment: 72 YOF with large mediastinal mass and pericardial effusion, s/p pericardial window 11/4.  Biopsy revealed B-cell lymphoma s/p 1st cycle of R-CHOP.  She remains on ventilator with trach.  Noted to have swollen LUE 11/16. UE dopplers reveal DVT in internal jugular, subclavian, and proximal axillary veins. Enoxaparin 40mg  given at 10am today. RN noted large bruise on hip, no change with start of Heparin.  11/4 aptt = 20sec, 11/11 INR = 1.33  Labs, 02/08/2015   CBC: Hgb = 9.7, pltc WNL  Goal of Therapy:  Heparin level 0.3-0.7 units/ml Monitor platelets by anticoagulation protocol: Yes   Plan:   Started Heparin infusion  at 1300 units/hr (no bolus d/t somewhat recent pericardial window and enoxaparin dose today). D/C enoxaparin  8h heparin level = 0.59 units/ml (within goal range)  Continue Heparin at 1300 units/hr, next level at 5am with daily labs  Daily CBC and heparin levels  Await long-term plans for anticoagulation  Minda Ditto PharmD Pager [phone] 02/08/2015, 9:52 PM

## 2015-02-09 DIAGNOSIS — I82622 Acute embolism and thrombosis of deep veins of left upper extremity: Secondary | ICD-10-CM

## 2015-02-09 DIAGNOSIS — G893 Neoplasm related pain (acute) (chronic): Secondary | ICD-10-CM | POA: Insufficient documentation

## 2015-02-09 LAB — GLUCOSE, CAPILLARY
GLUCOSE-CAPILLARY: 138 mg/dL — AB (ref 65–99)
GLUCOSE-CAPILLARY: 110 mg/dL — AB (ref 65–99)
GLUCOSE-CAPILLARY: 138 mg/dL — AB (ref 65–99)
GLUCOSE-CAPILLARY: 110 mg/dL — AB (ref 65–99)
Glucose-Capillary: 158 mg/dL — ABNORMAL HIGH (ref 65–99)

## 2015-02-09 LAB — URINALYSIS, ROUTINE W REFLEX MICROSCOPIC
GLUCOSE, UA: NEGATIVE mg/dL
KETONES UR: NEGATIVE mg/dL
NITRITE: POSITIVE — AB
PH: 6 (ref 5.0–8.0)
Protein, ur: 30 mg/dL — AB
SPECIFIC GRAVITY, URINE: 1.022 (ref 1.005–1.030)

## 2015-02-09 LAB — PHOSPHORUS: PHOSPHORUS: 3.4 mg/dL (ref 2.5–4.6)

## 2015-02-09 LAB — URINE MICROSCOPIC-ADD ON

## 2015-02-09 LAB — BASIC METABOLIC PANEL
ANION GAP: 8 (ref 5–15)
BUN: 27 mg/dL — ABNORMAL HIGH (ref 6–20)
CO2: 35 mmol/L — AB (ref 22–32)
Calcium: 8.6 mg/dL — ABNORMAL LOW (ref 8.9–10.3)
Chloride: 99 mmol/L — ABNORMAL LOW (ref 101–111)
Creatinine, Ser: 0.34 mg/dL — ABNORMAL LOW (ref 0.44–1.00)
GFR calc Af Amer: >60 mL/min (ref >60)
GFR calc non Af Amer: >60 mL/min (ref >60)
GLUCOSE: 151 mg/dL — AB (ref 65–99)
POTASSIUM: 3.8 mmol/L (ref 3.5–5.1)
Sodium: 142 mmol/L (ref 135–145)

## 2015-02-09 LAB — CBC
HCT: 30.3 % — ABNORMAL LOW (ref 36.0–46.0)
HEMOGLOBIN: 9.5 g/dL — AB (ref 12.0–15.0)
MCH: 30.6 pg (ref 26.0–34.0)
MCHC: 31.4 g/dL (ref 30.0–36.0)
MCV: 97.7 fL (ref 78.0–100.0)
Platelets: 193 10*3/uL (ref 150–400)
RBC: 3.1 MIL/uL — AB (ref 3.87–5.11)
RDW: 14.9 % (ref 11.5–15.5)
WBC: 0.5 10*3/uL — AB (ref 4.0–10.5)

## 2015-02-09 LAB — HEPARIN LEVEL (UNFRACTIONATED): Heparin Unfractionated: 0.48 IU/mL (ref 0.30–0.70)

## 2015-02-09 LAB — URIC ACID: Uric Acid, Serum: 1.3 mg/dL — ABNORMAL LOW (ref 2.3–6.6)

## 2015-02-09 LAB — PROCALCITONIN: Procalcitonin: 0.39 ng/mL

## 2015-02-09 MED ORDER — SODIUM CHLORIDE 0.9 % IV BOLUS (SEPSIS)
500.0000 mL | Freq: Once | INTRAVENOUS | Status: AC
Start: 2015-02-09 — End: 2015-02-09
  Administered 2015-02-09: 500 mL via INTRAVENOUS

## 2015-02-09 MED ORDER — FENTANYL CITRATE (PF) 100 MCG/2ML IJ SOLN
100.0000 ug | INTRAMUSCULAR | Status: DC | PRN
  Administered 2015-02-09 – 2015-02-10 (×4): 100 ug via INTRAVENOUS
  Filled 2015-02-09 (×4): qty 2

## 2015-02-09 MED ORDER — DEXMEDETOMIDINE HCL IN NACL 200 MCG/50ML IV SOLN
0.2000 ug/kg/h | INTRAVENOUS | Status: DC
  Administered 2015-02-09: 0.2 ug/kg/h via INTRAVENOUS
  Filled 2015-02-09: qty 50

## 2015-02-09 MED ORDER — FUROSEMIDE 10 MG/ML IJ SOLN
INTRAMUSCULAR | Status: AC
  Filled 2015-02-09: qty 4

## 2015-02-09 MED ORDER — FENTANYL CITRATE (PF) 100 MCG/2ML IJ SOLN
100.0000 ug | Freq: Once | INTRAMUSCULAR | Status: AC
  Administered 2015-02-09: 100 ug via INTRAVENOUS

## 2015-02-09 MED ORDER — METOPROLOL TARTRATE 1 MG/ML IV SOLN
5.0000 mg | Freq: Once | INTRAVENOUS | Status: DC
  Filled 2015-02-09: qty 5

## 2015-02-09 MED ORDER — TBO-FILGRASTIM 480 MCG/0.8ML ~~LOC~~ SOSY
480.0000 ug | PREFILLED_SYRINGE | Freq: Every day | SUBCUTANEOUS | Status: DC
  Administered 2015-02-09 – 2015-02-13 (×5): 480 ug via SUBCUTANEOUS
  Filled 2015-02-09 (×7): qty 0.8

## 2015-02-09 MED ORDER — ACETAMINOPHEN 325 MG PO TABS
650.0000 mg | ORAL_TABLET | ORAL | Status: DC | PRN
  Administered 2015-02-09 – 2015-02-14 (×8): 650 mg via ORAL
  Filled 2015-02-09 (×8): qty 2

## 2015-02-09 MED ORDER — CHLORHEXIDINE GLUCONATE 0.12 % MT SOLN
OROMUCOSAL | Status: AC
  Administered 2015-02-09: 15 mL
  Filled 2015-02-09: qty 15

## 2015-02-09 MED ORDER — INSULIN DETEMIR 100 UNIT/ML ~~LOC~~ SOLN
25.0000 [IU] | Freq: Every day | SUBCUTANEOUS | Status: DC
  Filled 2015-02-09: qty 0.25

## 2015-02-09 MED ORDER — SENNOSIDES-DOCUSATE SODIUM 8.6-50 MG PO TABS
2.0000 | ORAL_TABLET | Freq: Every day | ORAL | Status: DC
  Administered 2015-02-12: 2 via ORAL
  Filled 2015-02-09: qty 2

## 2015-02-09 MED ORDER — FENTANYL CITRATE (PF) 100 MCG/2ML IJ SOLN
INTRAMUSCULAR | Status: AC
  Administered 2015-02-09: 100 ug via INTRAVENOUS
  Filled 2015-02-09: qty 2

## 2015-02-09 MED ORDER — QUETIAPINE FUMARATE 50 MG PO TABS
25.0000 mg | ORAL_TABLET | Freq: Two times a day (BID) | ORAL | Status: DC
  Administered 2015-02-09 – 2015-02-13 (×8): 25 mg via ORAL
  Filled 2015-02-09 (×8): qty 1

## 2015-02-09 MED ORDER — FENTANYL 25 MCG/HR TD PT72
25.0000 ug | MEDICATED_PATCH | TRANSDERMAL | Status: DC
  Administered 2015-02-09 – 2015-02-12 (×2): 25 ug via TRANSDERMAL
  Filled 2015-02-09 (×2): qty 1

## 2015-02-09 MED ORDER — FUROSEMIDE 10 MG/ML IJ SOLN
40.0000 mg | Freq: Once | INTRAMUSCULAR | Status: AC
  Administered 2015-02-09: 40 mg via INTRAVENOUS
  Filled 2015-02-09: qty 4

## 2015-02-09 NOTE — Progress Notes (Signed)
[facility] Progress Note Patient Name: Maria Bauer DOB: Mar 18, 1946 MRN: [id number]   Date of Service  02/09/2015  HPI/Events of Note  Temp now noted 101 Send BC,  Send UA  eICU Interventions  Dropped BP after sedation bolus     Intervention Category Intermediate Interventions: Infection - evaluation and management  Raylene Miyamoto. 02/09/2015, 8:37 PM

## 2015-02-09 NOTE — Progress Notes (Signed)
Maria Bauer   HEMATOLOGY/ONCOLOGY INPATIENT PROGRESS NOTE  Date of Service: 02/09/2015  Inpatient Attending: .Marshell Garfinkel, MD   SUBJECTIVE  Maria Bauer was seen this evening with her daughter and husband at bedside. I updated her family regarding status from an oncology standpoint. Daughter notes that her mother is complaining of pain and that is contributing to agitation. Patient does not appear to be on any scheduled pain medications. Daughter notes that the patient has been very comfortable on fentanyl patch previously. This was ordered to address any agitation related to uncontrolled pain.  OBJECTIVE:  PHYSICAL EXAMINATION: . Filed Vitals:   02/09/15 1730 02/09/15 1800 02/09/15 1956 02/09/15 2034  BP: 112/45 104/47    Pulse: 138 121 159   Temp:    101.4 F (38.6 C)  TempSrc:    Axillary  Resp: 17 17 21    Height:      Weight:      SpO2: 100% 100% 100%    Filed Weights   02/02/15 0500 02/03/15 0319 02/04/15 1600  Weight: 209 lb 14.1 oz (95.2 kg) 203 lb 11.3 oz (92.4 kg) 212 lb 8.4 oz (96.4 kg)   .Body mass index is 36.46 kg/(m^2).   GENERAL no acute distress, trach in situ SKIN: no acute rashes EYES: making eye contact OROPHARYNX: oral mucosa moist. NECK: supple, thyroid normal size, non-tender, without nodularity LYMPH: no palpable lymphadenopathy in the cervical, axillary or inguinal area LUNGS: Distant breath , decreased air entry LL>>Rt HEART: regular rate & rhythm and no murmurs and trace pedal edema ABDOMEN:soft, non-tender and normal bowel sounds, no hepatosplenomegaly. NEURO: appears awake and alert. EXT- b/l Upper extremity swelling LL>Rt   ALLERGIES:  is allergic to morphine and related and norco.  MEDICATIONS:  Scheduled Meds: . allopurinol  150 mg Per Tube Daily  . antiseptic oral rinse  7 mL Mouth Rinse QID  . bisacodyl  10 mg Oral Daily  . chlorhexidine gluconate  15 mL Mouth Rinse BID  . fentaNYL  25 mcg Transdermal Q72H  . free water  250 mL Per  Tube Q6H  . insulin aspart  0-20 Units Subcutaneous 6 times per day  . metoprolol  5 mg Intravenous Once  . metoprolol tartrate  50 mg Oral BID  . QUEtiapine  25 mg Oral BID  . ranitidine  150 mg Oral BID  . senna-docusate  2 tablet Oral QHS  . Tbo-Filgrastim  480 mcg Subcutaneous Daily   Continuous Infusions: . dextrose 5 % and 0.45% NaCl 10 mL/hr at 02/09/15 0824  . heparin 1,300 Units/hr (02/09/15 0800)   PRN Meds:.acetaminophen, alteplase, fentaNYL (SUBLIMAZE) injection, heparin lock flush, heparin lock flush, LORazepam, ondansetron (ZOFRAN) IV, sodium chloride, sodium chloride  REVIEW OF SYSTEMS:    10 Point review of Systems was done is negative except as noted above.   LABORATORY DATA:  I have reviewed the data as listed  . CBC Latest Ref Rng 02/09/2015 02/08/2015 02/07/2015  WBC 4.0 - 10.5 K/uL 0.5(LL) 6.2 8.8  Hemoglobin 12.0 - 15.0 g/dL 9.5(L) 9.7(L) 9.1(L)  Hematocrit 36.0 - 46.0 % 30.3(L) 31.7(L) 29.8(L)  Platelets 150 - 400 K/uL 193 269 282    . CMP Latest Ref Rng 02/09/2015 02/08/2015 02/07/2015  Glucose 65 - 99 mg/dL 151(H) 162(H) 161(H)  BUN 6 - 20 mg/dL 27(H) 30(H) 42(H)  Creatinine 0.44 - 1.00 mg/dL 0.34(L) <0.30(L) 0.33(L)  Sodium 135 - 145 mmol/L 142 144 148(H)  Potassium 3.5 - 5.1 mmol/L 3.8 4.3 4.3  Chloride 101 - 111  mmol/L 99(L) 103 113(H)  CO2 22 - 32 mmol/L 35(H) 35(H) 32  Calcium 8.9 - 10.3 mg/dL 8.6(L) 8.6(L) 8.6(L)  Total Protein 6.5 - 8.1 g/dL - 4.5(L) 4.8(L)  Total Bilirubin 0.3 - 1.2 mg/dL - 2.0(H) 2.7(H)  Alkaline Phos 38 - 126 U/L - 85 81  AST 15 - 41 U/L - 28 26  ALT 14 - 54 U/L - 57(H) 38    RADIOGRAPHIC STUDIES: I have personally reviewed the radiological images as listed and agreed with the findings in the report. Ct Abdomen Pelvis Wo Contrast  02/01/2015  CLINICAL DATA:  Diffuse large B-cell lymphoma undergoing chemotherapy. EXAM: CT ABDOMEN AND PELVIS WITHOUT CONTRAST TECHNIQUE: Multidetector CT imaging of the abdomen and  pelvis was performed following the standard protocol without IV contrast. COMPARISON:  Recent chest CT 01/19/2015 FINDINGS: Examination is degraded by motion. Lower chest: There is air in the left chest wall likely from recent biopsy/surgery. The left-sided chest tube is in place. Extensive left lower lobe airspace consolidation which could reflect atelectasis or pneumonia. Small bilateral pleural effusions. Right basilar atelectasis. There is an NG tube coursing down the esophagus and into the stomach. Bulky mediastinal lymphadenopathy again demonstrated. Coronary artery calcifications are noted. Hepatobiliary: No focal hepatic lesions except for a small cyst in the right hepatic lobe inferiorly. No evidence of metastatic disease without IV contrast. The gallbladder is grossly normal. No common bile duct dilatation. Pancreas: No mass, inflammation or ductal dilatation. Spleen: Normal size.  No definite lesions. Adrenals/Urinary Tract: The adrenal glands are normal. Both kidneys are normal except for a right renal calculus. No hydronephrosis. Stomach/Bowel: The stomach contains an NG tube. The duodenum, small bowel and colon are unremarkable. No inflammatory changes, mass lesions or obstructive findings. A rectal catheter is in place. Vascular/Lymphatic: No mesenteric or retroperitoneal mass or adenopathy. Small scattered lymph nodes are noted. There are moderate to advanced aortic calcifications but no focal aneurysm. Other: No pelvic mass or adenopathy. No free pelvic fluid collections. A Foley catheter is noted in the bladder. The uterus is surgically absent. No inguinal mass or adenopathy. Musculoskeletal: No significant bony findings. Degenerative changes involving the spine with severe facet disease. There is a remote appearing compression fracture of L1. IMPRESSION: 1. Left-sided chest tube in place with a small left pleural effusion. Significant left lower lobe airspace consolidation suggesting pneumonia or  dense atelectasis. 2. Bulky mediastinal lymphadenopathy. 3. No adenopathy in the abdomen or pelvis. 4. No acute abdominal/pelvic findings. Electronically Signed   By: Marijo Sanes M.D.   On: 02/01/2015 21:33   Dg Chest 2 View  01/17/2015  CLINICAL DATA:  Cough and shortness of breath for the past several days EXAM: CHEST  2 VIEW COMPARISON:  None in PACs FINDINGS: There is an abnormal soft tissue mass arising from the left hilar and suprahilar regions adjacent to the AP window. The aortic knob remains reasonably well demonstrated. The cardiac silhouette is enlarged. The pulmonary vascularity is not engorged. The lungs are well-expanded. There is no focal infiltrate nor significant pleural effusion. There is moderate dextrocurvature of the thoracic spine centered at approximately T8. IMPRESSION: Large abnormal anterior mediastinal mass. This may be neoplastic or vascular in nature. CT scanning of the chest now is recommended. These results will be called to the ordering clinician or representative by the Radiologist Assistant, and communication documented in the PACS or zVision Dashboard. Electronically Signed   By: David  Martinique M.D.   On: 01/17/2015 11:15   Ct Chest W  Contrast  01/19/2015  CLINICAL DATA:  Cough, shortness of breath, possible chest mass. EXAM: CT CHEST WITH CONTRAST TECHNIQUE: Multidetector CT imaging of the chest was performed during intravenous contrast administration. CONTRAST:  33mL ISOVUE-300 IOPAMIDOL (ISOVUE-300) INJECTION 61% COMPARISON:  January 17, 2015. FINDINGS: No pneumothorax or pleural effusion is noted. 5 mm nodule is noted posteriorly in the right lower lobe best seen on image number 27 of series 4. Atherosclerosis of thoracic aorta is noted without aneurysm or dissection. Great vessels are widely patent. Mild coronary artery calcifications are noted. Moderate pericardial effusion is noted. Visualized portion of upper abdomen is unremarkable. Abnormal soft tissue density is  noted in the anterior mediastinum that measures 12.0 x 8.8 cm. This is noted to compress a portion of the left innominate vein. This is most consistent with adenopathy consistent with lymphoma or metastatic disease. No significant osseous abnormality is noted. IMPRESSION: 12.0 x 8.8 cm abnormal soft tissue density seen in anterior mediastinum concerning for adenopathy related to lymphoma or other metastatic disease. Some compression of the left innominate vein is noted as a result. Moderate pericardial effusion is noted as well. 5 mm nodule is noted posteriorly in the right lower lobe. This may represent metastatic focus given above finding. Alternatively, it may be unrelated. Continued CT follow-up is recommended to ensure stability. These results will be called to the ordering clinician or representative by the Radiologist Assistant, and communication documented in the PACS or zVision Dashboard. Electronically Signed   By: Marijo Conception, M.D.   On: 01/19/2015 12:07   Dg Chest Port 1 View  02/08/2015  CLINICAL DATA:  Mediastinal mass. EXAM: PORTABLE CHEST 1 VIEW COMPARISON:  Earlier today FINDINGS: Tracheostomy tube tip is above the carina. There is a right IJ catheter with tip in the projection of the cavoatrial junction. There is a nasogastric tube with tip in the stomach. Cardiac enlargement and aortic atherosclerosis noted left perihilar and left base opacification is unchanged from previous exam. IMPRESSION: 1. No change in aeration to left lung compared with prior exam. 2. Stable support apparatus. Electronically Signed   By: Kerby Moors M.D.   On: 02/08/2015 16:55   Dg Chest Port 1 View  02/08/2015  CLINICAL DATA:  History of diffuse large B-cell lymphoma and melanoma. Mediastinal mass. EXAM: PORTABLE CHEST 1 VIEW COMPARISON:  02/08/2015 and 02/07/2015 FINDINGS: Tracheostomy tube and right IJ central venous catheter unchanged. Nasogastric tube courses into the stomach and off the inferior  portion of the film. Stable opacification of the left perihilar region and left base. Right lung is clear. Remainder of the exam is unchanged. IMPRESSION: Stable opacification over the left perihilar region and left base. Tubes and lines unchanged. Electronically Signed   By: Marin Olp M.D.   On: 02/08/2015 10:23   Dg Chest Port 1 View  02/08/2015  CLINICAL DATA:  Acute respiratory failure. EXAM: PORTABLE CHEST 1 VIEW COMPARISON:  February 07, 2015. FINDINGS: Tracheostomy and nasogastric tubes are unchanged in position. Stable cardiomegaly. Right internal jugular catheter is noted with distal tip in expected position of the SVC. No pneumothorax is noted. Mild central pulmonary vascular congestion is noted. Stable left perihilar and basilar opacity is noted concerning for atelectasis or pneumonia with possible associated pleural effusion. IMPRESSION: Stable support apparatus. Stable left perihilar and basilar opacity is noted concerning for pneumonia or atelectasis with associated pleural effusion. Electronically Signed   By: Marijo Conception, M.D.   On: 02/08/2015 07:26   Dg Chest [facility]  1 View  02/07/2015  CLINICAL DATA:  Respiratory failure.  Mediastinal mass. EXAM: PORTABLE CHEST 1 VIEW COMPARISON:  Radiographs from 02/02/2015 through 02/06/2015 FINDINGS: Tracheostomy tube, NG tube and central catheter appear unchanged. Slight increased atelectasis at the right base. Persistent atelectasis at the left base. Pulmonary vascularity is normal. IMPRESSION: Slight increased atelectasis at the right base. Persistent atelectasis on the left. Electronically Signed   By: Lorriane Shire M.D.   On: 02/07/2015 07:42   Dg Chest Port 1 View  02/06/2015  CLINICAL DATA:  Respiratory failure EXAM: PORTABLE CHEST 1 VIEW COMPARISON:  Yesterday FINDINGS: Tubular devices are stable. Airspace disease throughout the left lung and at the right base is worse. Heart remains enlarged. Stable vascular congestion. Less aeration  in yesterday. IMPRESSION: Worsening bilateral airspace disease left greater than right. Electronically Signed   By: Marybelle Killings M.D.   On: 02/06/2015 07:41   Dg Chest Port 1 View  02/05/2015  CLINICAL DATA:  Respiratory failure. Mediastinal mass with pericardial effusion. EXAM: PORTABLE CHEST 1 VIEW COMPARISON:  Radiographs dated 02/03/2015 and 02/04/2015 FINDINGS: Central line, NG tube, and tracheostomy tube appear in good position, unchanged. There is new slight pulmonary vascular congestion. No change in slight atelectasis in left lung. Mediastinal mass is obscured due to rotation. Right lung is clear of infiltrates and atelectasis. IMPRESSION: New slight pulmonary vascular prominence. Slight atelectasis in the left midzone, stable. Electronically Signed   By: Lorriane Shire M.D.   On: 02/05/2015 07:24   Dg Chest Port 1 View  02/04/2015  CLINICAL DATA:  69 year old female with respiratory failure and mediastinal mass. EXAM: PORTABLE CHEST 1 VIEW COMPARISON:  02/03/2015 and prior radiograph FINDINGS: Tracheostomy tube, right IJ central venous catheter with tip overlying the lower SVC, and NG tube with tip overlying the proximal -mid stomach again noted. A large left mediastinal mass is again noted. The cardiomediastinal silhouette is unchanged. There is no evidence of pneumothorax. Continued left basilar atelectasis noted. IMPRESSION: Little significant change. Left mediastinal mass with left basilar atelectasis Electronically Signed   By: Margarette Canada M.D.   On: 02/04/2015 07:42   Dg Chest Port 1 View  02/03/2015  CLINICAL DATA:  Respiratory distress. Patient with a tracheostomy. Subsequent encounter. EXAM: PORTABLE CHEST 1 VIEW COMPARISON:  02/02/2015 FINDINGS: Masslike opacity obscures the left hilum and left mediastinum merging with the left heart border. This is without significant change. No convincing pulmonary edema. Probable atelectasis at the left lung base. No pneumothorax or obvious pleural  effusion. Tracheostomy tube and right internal jugular central venous line are stable and well positioned. New orogastric tube passes below the diaphragm into the stomach. IMPRESSION: 1. No acute findings in the lungs. 2. Stable masslike opacity along the anterior mediastinum. 3. New orogastric tube is well positioned. Tracheostomy tube and right internal jugular central venous line are stable and also well positioned. Electronically Signed   By: Lajean Manes M.D.   On: 02/03/2015 08:34   Dg Chest Port 1 View  02/02/2015  CLINICAL DATA:  Tracheostomy tube placement. EXAM: PORTABLE CHEST 1 VIEW COMPARISON:  Earlier film 02/02/2015. FINDINGS: The tracheostomy tube is in good position with the tip at the mid tracheal level. The right IJ catheter is stable. Stable mediastinal adenopathy/mass and persistent left lower lobe infiltrate/atelectasis/effusion. IMPRESSION: Tracheostomy tube in good position without complicating features. Electronically Signed   By: Marijo Sanes M.D.   On: 02/02/2015 12:18   Dg Chest Port 1 View  02/02/2015  CLINICAL DATA:  Shortness of breath. EXAM: PORTABLE CHEST 1 VIEW COMPARISON:  02/01/2015 FINDINGS: Examination is partially limited by leftward patient rotation. Endotracheal tube terminates above the level of the thoracic inlet and has likely been slightly retracted in the interim. Enteric tube courses towards the left upper abdomen with tip not imaged. Right jugular central venous catheter remains, projecting over the SVC. Known, large mediastinal mass is again seen. Cardiac silhouette remains enlarged. Left chest tube remains in place. No pneumothorax is identified. Left basilar opacities have slightly improved. Mild right basilar opacity is unchanged may reflect atelectasis. IMPRESSION: 1. Mild interval retraction of the endotracheal tube which projects above the thoracic inlet. Consider advancement. 2. Minimally improved aeration of the left lung base. No pneumothorax.  Electronically Signed   By: Logan Bores M.D.   On: 02/02/2015 07:18   Dg Chest Port 1 View  02/01/2015  CLINICAL DATA:  Pericardial window.  Pericardial effusion. EXAM: PORTABLE CHEST 1 VIEW COMPARISON:  01/31/2015 FINDINGS: Endotracheal tube in good position. NG tube in place with the tip not visualized due to underpenetration. Right jugular central venous catheter tip in the SVC. No pneumothorax on the right Interval improvement in aeration of the left lung with decrease in density left lower lobe. This may be due to decreased diffusion atelectasis. Left chest tube remains in place. No pneumothorax on the left. Cardiac enlargement has improved. Right lower lobe atelectasis is present. IMPRESSION: Endotracheal tube in good position. Decreased left lower lobe density with improved aeration of the left lung. Left chest tube in place without pneumothorax. Electronically Signed   By: Franchot Gallo M.D.   On: 02/01/2015 07:16   Dg Chest Port 1 View  01/31/2015  CLINICAL DATA:  Status post median sternotomy and pericardial window creation for pleural effusion, acute respiratory failure, cardiac tamponade, mediastinal mass. EXAM: PORTABLE CHEST 1 VIEW COMPARISON:  Portable chest x-ray of January 27, 2015 FINDINGS: The trachea has apparently been extubated. There is linear radiodensity that projects over the lower neck which could be a portion of the endotracheal tube but if so it is positioned quite high. The right lung remains well-expanded and clear. On the left only a small amount of aerated lung persists in the apex and laterally. The left chest tube is stable. The large mediastinal mass is unchanged. There is no pneumothorax nor definite pleural effusion. The esophagogastric tube tip projects below the inferior margin of the image. The right internal jugular venous catheter tip projects over the midportion of the SVC. IMPRESSION: Stable appearance of the chest following extubation of the trachea. Persistent  large mediastinal mass. There is no pneumothorax. Electronically Signed   By: David  Martinique M.D.   On: 01/31/2015 07:33   Dg Chest Port 1 View  01/30/2015  CLINICAL DATA:  Persistent fever. Status post pericardial window and mediastinal biopsy. EXAM: PORTABLE CHEST 1 VIEW COMPARISON:  Multiple chest x-rays and chest CT from 01/19/2015 FINDINGS: The need support apparatus is stable. The endotracheal tube is 4.3 cm above the carina. The NG tube is stable. The left-sided chest tube is stable. Persistent large mediastinal mass occupying good portion of the left hemi thorax. Persistent vascular congestion and bibasilar atelectasis. IMPRESSION: Stable support apparatus. Stable left-sided chest tube.  No definite pneumothorax. Stable large mass occupying a good portion of the left hemi thorax. Electronically Signed   By: Marijo Sanes M.D.   On: 01/30/2015 23:30   Dg Chest Port 1 View  01/30/2015  CLINICAL DATA:  Intubated patient, acute respiratory  failure and hypoxia, history of mediastinal mass, pericardial effusion and cardiac tamponade EXAM: PORTABLE CHEST 1 VIEW COMPARISON:  Portable chest x-ray of January 29, 2015. FINDINGS: Stable volume loss on the left with large mediastinal mass. The left-sided chest tubes are unchanged in position. There is no pneumothorax nor large pleural effusion. The right lung is well-expanded. Mild interstitial prominence throughout the right lung is stable. The pulmonary vascularity is not engorged. The endotracheal tube tip lies 3.8 cm above the carina. The right internal jugular venous catheter tip projects over the midportion of the SVC. The esophagogastric tube tip projects below the inferior margin of the image. IMPRESSION: There has been no significant change in the appearance of the chest since yesterday's study. The support tubes are in reasonable position. Electronically Signed   By: David  Martinique M.D.   On: 01/30/2015 07:21   Dg Chest Port 1 View  01/29/2015  CLINICAL  DATA:  Shortness of breath, acute respiratory failure, intubated patient, history of mediastinal mass, pericardial effusion, and cardiac tamponade. EXAM: PORTABLE CHEST 1 VIEW COMPARISON:  Portable chest x-ray of January 28, 2015 FINDINGS: The right lung is well-expanded. There is a trace of pleural fluid at the right lung base. On the left the cardiac silhouette occupies much of the hemi thorax. A small amount of aerated lung in the apex and inferior laterally is still demonstrated. The endotracheal tube tip projects approximately 3.7 cm above the carina. The esophagogastric tube tip projects below the inferior margin of the image. A left-sided chest tube has its tip located over the lateral aspect of the approximately 6 rib and is stable. A second tube versus the tip of the esophagogastric tube is noted medially at the left lung base. IMPRESSION: Persistent large mediastinal mass on the left. Slight interval worsening in volume loss on the left. The cardiac silhouette remains enlarged. No pneumothorax nor large left pleural effusion is observed. The right lung is largely clear. Electronically Signed   By: David  Martinique M.D.   On: 01/29/2015 07:25   Dg Chest Port 1 View  01/28/2015  CLINICAL DATA:  ET tube placement EXAM: PORTABLE CHEST 1 VIEW COMPARISON:  01/27/15 FINDINGS: The ET tube tip is stable above the carina. There is a right IJ catheter with tip in the projection of the SVC. Left-sided chest tube is identified. No pneumothorax. Stable cardiac enlargement and stable large anterior mediastinal mass. No change in small bilateral pleural effusions and pulmonary edema consistent with CHF. IMPRESSION: 1. Left chest tube in place without visible pneumothorax. 2. Large anterior mediastinal mass 3. Bilateral pleural effusions and pulmonary edema. Electronically Signed   By: Kerby Moors M.D.   On: 01/28/2015 08:28   Dg Chest Port 1 View  01/27/2015  CLINICAL DATA:  Pericardial effusion EXAM: PORTABLE CHEST  1 VIEW COMPARISON:  02/03/2015 FINDINGS: ET tube tip is stable above the carina. There is a right IJ catheter with tip in the projection of the SVC. Left chest tube is in place. No pneumothorax. NG tube tip is in the stomach. Cardiac enlargement and large anterior mediastinal mass is again noted. Unchanged from previous exam. Increase in pleural fluid noted bilaterally. IMPRESSION: 1. Stable support apparatus. 2. Left chest tube without pneumothorax. 3. Increase in bilateral pleural effusions. Electronically Signed   By: Kerby Moors M.D.   On: 01/27/2015 09:00   Dg Chest Portable 1 View  02/18/2015  CLINICAL DATA:  Status post pericardial window and Chamberlain procedure for large mediastinal mass with associated  pericardial effusion. EXAM: PORTABLE CHEST 1 VIEW COMPARISON:  CT of the chest on 01/19/2015 FINDINGS: Endotracheal tube present with the tip approximately 2.5 cm above the carina. Right jugular central line has been placed with the catheter tip in the SVC. Pericardial drain and left-sided chest tube present. Nasogastric tube extends into the distal esophagus and does not extend below the diaphragm. No pneumothorax identified. Lung volumes are very low bilaterally. Huge mediastinal mass again visualized which is more prominent to the left of midline. IMPRESSION: 1. No evidence of pneumothorax postoperatively. 2. Low lung volumes. 3. Nasogastric tube tip lies in the distal esophagus. Electronically Signed   By: Aletta Edouard M.D.   On: 01/28/2015 14:30   Dg Abd Portable 1v  02/08/2015  CLINICAL DATA:  Nausea and vomiting. History of lymphoma and melanoma. EXAM: PORTABLE ABDOMEN - 1 VIEW COMPARISON:  02/05/2015. FINDINGS: Scattered air throughout the small bowel and colon and some residual contrast in the colon. Findings suggest a mild ileus. IMPRESSION: Persistent mild ileus bowel gas pattern. Electronically Signed   By: Marijo Sanes M.D.   On: 02/08/2015 10:09   Dg Abd Portable  1v  02/05/2015  CLINICAL DATA:  NG tube placement. EXAM: PORTABLE ABDOMEN - 1 VIEW COMPARISON:  CT scan 02/01/2015 FINDINGS: The NG tube is in the body region of the stomach. Scattered air in the small bowel and colon along with some contrast in the colon could suggest a mild ileus. No free air. Bibasilar atelectasis. IMPRESSION: NG tube in good position in the body region the stomach. Moderate air throughout the bowel may suggest a diffuse ileus. Electronically Signed   By: Marijo Sanes M.D.   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 LDH level 950. 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 Cytogenetics and FISH studies pending. Low CD20 positivity which can be seen about 16-20% of diffuse large B-cell lymphoma is and could potentially portend a poor prognosis independent of the IPI index. (Blood 2009 113:[id number] Hilaria Ota et al.) Cannot r/o a double hit lymphoma - FISH for BCL2 and cMYC pending results. Blood counts stable. On presentation had difficulty with breathing due to airway compression. 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 Decreasing LDH levels suggestive of response to treatment. Patient had better weaning trials today. S/p first cycle of CHOP on 02/02/2015 and Rituxan 02/05/2015. LDH down from 950 to about 400 suggesting good response to treatment thus far but still continues to have multifactorial challenges with weaning. Plan -continue to monitor cbc daily.  -started on G-CSF Rosalia daily since she is now neutropenic. Continue daily till ANC>1000 x 2 days. (Spleen normal in size) -ongoing weaning trials as per pulmonary/CCM  team -continue Allopurinol for TLS prophylaxis at reduced dose to 150mg  po daily (LFts improving) - will discontinue by day 14 if no evidence of significant TLS. -if double hit lymphoma or even based on high risk features might consider EPOCH-R from cycle 2 depending on how patient does. Cycle 2 if scheduled for 12/2.  -Will need outpatient PET/CT scan.  #2 ?HCAP/ventilator acquired pneumonia - elevated procalcitonin --- improving. Strept in secretions treated with Ceftriaxone - now off antibiotics. ?underlying sleep apnea - given body habitus. Might have lower O2  sats at baseline. -antibiotics/weaning trials and vent management as per critical care team. Added fentanyl patch 73mcg/h since she has used this in the past for pain mx and per daughter is complaining of pain which might be contributing to her agitation and tachcyardia.   #3 hypernatremia -resolved. likely due to free water deficit .sodium levels improving.  Intake/Output Summary (Last 24 hours) at 02/09/15 2253 Last data filed at 02/09/15 1911  Gross per 24 hour  Intake   1418 ml  Output   2775 ml  Net  -1357 ml   Fluid and electrolyte management as per critical care team.  #4 Previous history of upper extremity melanoma status post resection couple of years ago. Family notes that she was followed at [facility] with repeat scans that showed no evidence of metastatic disease or recurrence. She was last evaluated about a year ago.  #5 history of rheumatoid arthritis previously on methotrexate -started on fentanyl patch for pain control  #6 Hyperglycemia due to steroids ?h/o DM2. Improving control off steroids.  #7 LUE swelling - acute DVT noted on [place]- like related to left innomiate venous compression by mediastinal mass and hypercoag state from lymphoma, hospitalization obesity etc.  Plan -on iv heparin currently. -would need to transition to Lovenox eventually if renal function stable.   Plz call if any additional  questions.  Sullivan Lone MD [place] AAHIVMS [facility] Hematology/Oncology Physician [facility]  (Office):       [phone] (Work cell):  [phone] (Fax):           [phone]

## 2015-02-09 NOTE — Care Management Important Message (Signed)
Important Message  Patient Details  Name: Maria Bauer MRN: [id number] Date of Birth: 06-04-1945   Medicare Important Message Given:  Yes    Shelda Altes 02/09/2015, 1:06 [place] Message  Patient Details  Name: Maria Bauer MRN: [id number] Date of Birth: 02/12/1946   Medicare Important Message Given:  Yes    Shelda Altes 02/09/2015, 1:06 PM

## 2015-02-09 NOTE — Progress Notes (Signed)
CRITICAL VALUE ALERT  Critical value received:  WBC 0.5  Date of notification:  02-09-15  Time of notification:  [id number]  Critical value read back:Yes.    Nurse who received alert:  Leeann Must  MD notified (1st page):  Notified E-Link RN  Time of first page:  [phone]

## 2015-02-09 NOTE — Progress Notes (Signed)
PT Cancellation Note  Patient Details Name: Maria Bauer MRN: [id number] DOB: 08-25-1945   Cancelled Treatment:     PT eval deferred at request of RN 2* pt's elevated anxiety level.  Will follow.   Cola Gane 02/09/2015, 12:08 PM

## 2015-02-09 NOTE — Progress Notes (Signed)
Called by RN regarding hypotension with restart of precedex.  Precedex gtt turned off with recovery of BP.    Plan: -d/c precedex  -Increase PRN fentanyl to 100 mcg Q2 PRN  -Continue PRN ativan  -Trial low dose seroquel    Noe Gens, NP-C [facility] Pgr: [phone] or if no answer [phone] 02/09/2015, 2:23 PM

## 2015-02-09 NOTE — Progress Notes (Signed)
[facility]   HEMATOLOGY/ONCOLOGY INPATIENT PROGRESS NOTE  Date of Service: 02/09/2015  Inpatient Attending: .Chesley Mires, MD   SUBJECTIVE  Maria Bauer was seen this evening.  Sitting up in bed.  Appears comfortable.  Bilateral upper extremity swelling left greater than right.  Ultrasound showed extensive left-sided DVT. Started on IV heparin. Hypernatremia resolved. Continued downward trend of LDH suggesting continued response of the lymphoma.  OBJECTIVE:  PHYSICAL EXAMINATION: . Filed Vitals:   02/09/15 0100 02/09/15 0200 02/09/15 0400 02/09/15 0429  BP: 100/36 105/42  122/40  Pulse: 95 89  90  Temp:   100.3 F (37.9 C)   TempSrc:   Oral   Resp: 12 14  12   Height:      Weight:      SpO2: 100% 100%  98%   Filed Weights   02/02/15 0500 02/03/15 0319 02/04/15 1600  Weight: 209 lb 14.1 oz (95.2 kg) 203 lb 11.3 oz (92.4 kg) 212 lb 8.4 oz (96.4 kg)   .Body mass index is 36.46 kg/(m^2).   GENERAL no acute distress, trach in situ SKIN: no acute rashes EYES: making eye contact OROPHARYNX: oral mucosa moist. NECK: supple, thyroid normal size, non-tender, without nodularity LYMPH: no palpable lymphadenopathy in the cervical, axillary or inguinal area LUNGS: Distant breath , decreased air entry LL>>Rt HEART: regular rate & rhythm and no murmurs and trace pedal edema ABDOMEN:soft, non-tender and normal bowel sounds, no hepatosplenomegaly. NEURO: appears awake and alert. EXT- b/l Upper extremity swelling LL>Rt   ALLERGIES:  is allergic to morphine and related and norco.  MEDICATIONS:  Scheduled Meds: . allopurinol  150 mg Per Tube Daily  . antiseptic oral rinse  7 mL Mouth Rinse QID  . bisacodyl  10 mg Oral Daily  . chlorhexidine gluconate  15 mL Mouth Rinse BID  . feeding supplement (PRO-STAT SUGAR FREE 64)  60 mL Per Tube TID  . feeding supplement (VITAL HIGH PROTEIN)  1,000 mL Per Tube Q24H  . free water  250 mL Per Tube Q6H  . insulin aspart  0-20 Units Subcutaneous 6 times  per day  . insulin detemir  30 Units Subcutaneous Daily  . LORazepam  0.5 mg Oral 3 times per day  . metoCLOPramide (REGLAN) injection  5 mg Intravenous 3 times per day  . metoprolol tartrate  50 mg Oral BID  . ranitidine  150 mg Oral BID  . senna-docusate  1 tablet Oral QHS   Continuous Infusions: . dextrose 5 % and 0.45% NaCl 40 mL/hr at 02/08/15 0912  . heparin 1,300 Units/hr (02/08/15 1448)   PRN Meds:.albuterol, alteplase, diphenhydrAMINE, diphenhydrAMINE, EPINEPHrine, EPINEPHrine, EPINEPHrine, EPINEPHrine, famotidine, fentaNYL (SUBLIMAZE) injection, fentaNYL (SUBLIMAZE) injection, heparin lock flush, heparin lock flush, LORazepam, methylPREDNISolone sodium succinate, ondansetron (ZOFRAN) IV, sodium chloride, sodium chloride, sodium chloride, sodium chloride  REVIEW OF SYSTEMS:    10 Point review of Systems was done is negative except as noted above.   LABORATORY DATA:  I have reviewed the data as listed  . CBC Latest Ref Rng 02/08/2015 02/07/2015 02/06/2015  WBC 4.0 - 10.5 K/uL 6.2 8.8 7.7  Hemoglobin 12.0 - 15.0 g/dL 9.7(L) 9.1(L) 9.8(L)  Hematocrit 36.0 - 46.0 % 31.7(L) 29.8(L) 32.1(L)  Platelets 150 - 400 K/uL 269 282 355    . CMP Latest Ref Rng 02/08/2015 02/07/2015 02/06/2015  Glucose 65 - 99 mg/dL 162(H) 161(H) 270(H)  BUN 6 - 20 mg/dL 30(H) 42(H) 52(H)  Creatinine 0.44 - 1.00 mg/dL <0.30(L) 0.33(L) 0.45  Sodium 135 - 145  mmol/L 144 148(H) 152(H)  Potassium 3.5 - 5.1 mmol/L 4.3 4.3 4.2  Chloride 101 - 111 mmol/L 103 113(H) 116(H)  CO2 22 - 32 mmol/L 35(H) 32 31  Calcium 8.9 - 10.3 mg/dL 8.6(L) 8.6(L) 9.0  Total Protein 6.5 - 8.1 g/dL 4.5(L) 4.8(L) 5.5(L)  Total Bilirubin 0.3 - 1.2 mg/dL 2.0(H) 2.7(H) 2.4(H)  Alkaline Phos 38 - 126 U/L 85 81 106  AST 15 - 41 U/L 28 26 35  ALT 14 - 54 U/L 57(H) 38 38     RADIOGRAPHIC STUDIES: I have personally reviewed the radiological images as listed and agreed with the findings in the report. Ct Abdomen Pelvis Wo  Contrast  02/01/2015  CLINICAL DATA:  Diffuse large B-cell lymphoma undergoing chemotherapy. EXAM: CT ABDOMEN AND PELVIS WITHOUT CONTRAST TECHNIQUE: Multidetector CT imaging of the abdomen and pelvis was performed following the standard protocol without IV contrast. COMPARISON:  Recent chest CT 01/19/2015 FINDINGS: Examination is degraded by motion. Lower chest: There is air in the left chest wall likely from recent biopsy/surgery. The left-sided chest tube is in place. Extensive left lower lobe airspace consolidation which could reflect atelectasis or pneumonia. Small bilateral pleural effusions. Right basilar atelectasis. There is an NG tube coursing down the esophagus and into the stomach. Bulky mediastinal lymphadenopathy again demonstrated. Coronary artery calcifications are noted. Hepatobiliary: No focal hepatic lesions except for a small cyst in the right hepatic lobe inferiorly. No evidence of metastatic disease without IV contrast. The gallbladder is grossly normal. No common bile duct dilatation. Pancreas: No mass, inflammation or ductal dilatation. Spleen: Normal size.  No definite lesions. Adrenals/Urinary Tract: The adrenal glands are normal. Both kidneys are normal except for a right renal calculus. No hydronephrosis. Stomach/Bowel: The stomach contains an NG tube. The duodenum, small bowel and colon are unremarkable. No inflammatory changes, mass lesions or obstructive findings. A rectal catheter is in place. Vascular/Lymphatic: No mesenteric or retroperitoneal mass or adenopathy. Small scattered lymph nodes are noted. There are moderate to advanced aortic calcifications but no focal aneurysm. Other: No pelvic mass or adenopathy. No free pelvic fluid collections. A Foley catheter is noted in the bladder. The uterus is surgically absent. No inguinal mass or adenopathy. Musculoskeletal: No significant bony findings. Degenerative changes involving the spine with severe facet disease. There is a remote  appearing compression fracture of L1. IMPRESSION: 1. Left-sided chest tube in place with a small left pleural effusion. Significant left lower lobe airspace consolidation suggesting pneumonia or dense atelectasis. 2. Bulky mediastinal lymphadenopathy. 3. No adenopathy in the abdomen or pelvis. 4. No acute abdominal/pelvic findings. Electronically Signed   By: Marijo Sanes M.D.   On: 02/01/2015 21:33   Dg Chest 2 View  01/17/2015  CLINICAL DATA:  Cough and shortness of breath for the past several days EXAM: CHEST  2 VIEW COMPARISON:  None in PACs FINDINGS: There is an abnormal soft tissue mass arising from the left hilar and suprahilar regions adjacent to the AP window. The aortic knob remains reasonably well demonstrated. The cardiac silhouette is enlarged. The pulmonary vascularity is not engorged. The lungs are well-expanded. There is no focal infiltrate nor significant pleural effusion. There is moderate dextrocurvature of the thoracic spine centered at approximately T8. IMPRESSION: Large abnormal anterior mediastinal mass. This may be neoplastic or vascular in nature. CT scanning of the chest now is recommended. These results will be called to the ordering clinician or representative by the Radiologist Assistant, and communication documented in the PACS or zVision Dashboard.  Electronically Signed   By: David  Martinique M.D.   On: 01/17/2015 11:15   Ct Chest W Contrast  01/19/2015  CLINICAL DATA:  Cough, shortness of breath, possible chest mass. EXAM: CT CHEST WITH CONTRAST TECHNIQUE: Multidetector CT imaging of the chest was performed during intravenous contrast administration. CONTRAST:  61mL ISOVUE-300 IOPAMIDOL (ISOVUE-300) INJECTION 61% COMPARISON:  January 17, 2015. FINDINGS: No pneumothorax or pleural effusion is noted. 5 mm nodule is noted posteriorly in the right lower lobe best seen on image number 27 of series 4. Atherosclerosis of thoracic aorta is noted without aneurysm or dissection. Great  vessels are widely patent. Mild coronary artery calcifications are noted. Moderate pericardial effusion is noted. Visualized portion of upper abdomen is unremarkable. Abnormal soft tissue density is noted in the anterior mediastinum that measures 12.0 x 8.8 cm. This is noted to compress a portion of the left innominate vein. This is most consistent with adenopathy consistent with lymphoma or metastatic disease. No significant osseous abnormality is noted. IMPRESSION: 12.0 x 8.8 cm abnormal soft tissue density seen in anterior mediastinum concerning for adenopathy related to lymphoma or other metastatic disease. Some compression of the left innominate vein is noted as a result. Moderate pericardial effusion is noted as well. 5 mm nodule is noted posteriorly in the right lower lobe. This may represent metastatic focus given above finding. Alternatively, it may be unrelated. Continued CT follow-up is recommended to ensure stability. These results will be called to the ordering clinician or representative by the Radiologist Assistant, and communication documented in the PACS or zVision Dashboard. Electronically Signed   By: Marijo Conception, M.D.   On: 01/19/2015 12:07   Dg Chest Port 1 View  02/08/2015  CLINICAL DATA:  Mediastinal mass. EXAM: PORTABLE CHEST 1 VIEW COMPARISON:  Earlier today FINDINGS: Tracheostomy tube tip is above the carina. There is a right IJ catheter with tip in the projection of the cavoatrial junction. There is a nasogastric tube with tip in the stomach. Cardiac enlargement and aortic atherosclerosis noted left perihilar and left base opacification is unchanged from previous exam. IMPRESSION: 1. No change in aeration to left lung compared with prior exam. 2. Stable support apparatus. Electronically Signed   By: Kerby Moors M.D.   On: 02/08/2015 16:55   Dg Chest Port 1 View  02/08/2015  CLINICAL DATA:  History of diffuse large B-cell lymphoma and melanoma. Mediastinal mass. EXAM:  PORTABLE CHEST 1 VIEW COMPARISON:  02/08/2015 and 02/07/2015 FINDINGS: Tracheostomy tube and right IJ central venous catheter unchanged. Nasogastric tube courses into the stomach and off the inferior portion of the film. Stable opacification of the left perihilar region and left base. Right lung is clear. Remainder of the exam is unchanged. IMPRESSION: Stable opacification over the left perihilar region and left base. Tubes and lines unchanged. Electronically Signed   By: Marin Olp M.D.   On: 02/08/2015 10:23   Dg Chest Port 1 View  02/08/2015  CLINICAL DATA:  Acute respiratory failure. EXAM: PORTABLE CHEST 1 VIEW COMPARISON:  February 07, 2015. FINDINGS: Tracheostomy and nasogastric tubes are unchanged in position. Stable cardiomegaly. Right internal jugular catheter is noted with distal tip in expected position of the SVC. No pneumothorax is noted. Mild central pulmonary vascular congestion is noted. Stable left perihilar and basilar opacity is noted concerning for atelectasis or pneumonia with possible associated pleural effusion. IMPRESSION: Stable support apparatus. Stable left perihilar and basilar opacity is noted concerning for pneumonia or atelectasis with associated pleural effusion. Electronically  Signed   By: Marijo Conception, M.D.   On: 02/08/2015 07:26   Dg Chest Port 1 View  02/07/2015  CLINICAL DATA:  Respiratory failure.  Mediastinal mass. EXAM: PORTABLE CHEST 1 VIEW COMPARISON:  Radiographs from 02/02/2015 through 02/06/2015 FINDINGS: Tracheostomy tube, NG tube and central catheter appear unchanged. Slight increased atelectasis at the right base. Persistent atelectasis at the left base. Pulmonary vascularity is normal. IMPRESSION: Slight increased atelectasis at the right base. Persistent atelectasis on the left. Electronically Signed   By: Lorriane Shire M.D.   On: 02/07/2015 07:42   Dg Chest Port 1 View  02/06/2015  CLINICAL DATA:  Respiratory failure EXAM: PORTABLE CHEST 1 VIEW  COMPARISON:  Yesterday FINDINGS: Tubular devices are stable. Airspace disease throughout the left lung and at the right base is worse. Heart remains enlarged. Stable vascular congestion. Less aeration in yesterday. IMPRESSION: Worsening bilateral airspace disease left greater than right. Electronically Signed   By: Marybelle Killings M.D.   On: 02/06/2015 07:41   Dg Chest Port 1 View  02/05/2015  CLINICAL DATA:  Respiratory failure. Mediastinal mass with pericardial effusion. EXAM: PORTABLE CHEST 1 VIEW COMPARISON:  Radiographs dated 02/03/2015 and 02/04/2015 FINDINGS: Central line, NG tube, and tracheostomy tube appear in good position, unchanged. There is new slight pulmonary vascular congestion. No change in slight atelectasis in left lung. Mediastinal mass is obscured due to rotation. Right lung is clear of infiltrates and atelectasis. IMPRESSION: New slight pulmonary vascular prominence. Slight atelectasis in the left midzone, stable. Electronically Signed   By: Lorriane Shire M.D.   On: 02/05/2015 07:24   Dg Chest Port 1 View  02/04/2015  CLINICAL DATA:  69 year old female with respiratory failure and mediastinal mass. EXAM: PORTABLE CHEST 1 VIEW COMPARISON:  02/03/2015 and prior radiograph FINDINGS: Tracheostomy tube, right IJ central venous catheter with tip overlying the lower SVC, and NG tube with tip overlying the proximal -mid stomach again noted. A large left mediastinal mass is again noted. The cardiomediastinal silhouette is unchanged. There is no evidence of pneumothorax. Continued left basilar atelectasis noted. IMPRESSION: Little significant change. Left mediastinal mass with left basilar atelectasis Electronically Signed   By: Margarette Canada M.D.   On: 02/04/2015 07:42   Dg Chest Port 1 View  02/03/2015  CLINICAL DATA:  Respiratory distress. Patient with a tracheostomy. Subsequent encounter. EXAM: PORTABLE CHEST 1 VIEW COMPARISON:  02/02/2015 FINDINGS: Masslike opacity obscures the left hilum  and left mediastinum merging with the left heart border. This is without significant change. No convincing pulmonary edema. Probable atelectasis at the left lung base. No pneumothorax or obvious pleural effusion. Tracheostomy tube and right internal jugular central venous line are stable and well positioned. New orogastric tube passes below the diaphragm into the stomach. IMPRESSION: 1. No acute findings in the lungs. 2. Stable masslike opacity along the anterior mediastinum. 3. New orogastric tube is well positioned. Tracheostomy tube and right internal jugular central venous line are stable and also well positioned. Electronically Signed   By: Lajean Manes M.D.   On: 02/03/2015 08:34   Dg Chest Port 1 View  02/02/2015  CLINICAL DATA:  Tracheostomy tube placement. EXAM: PORTABLE CHEST 1 VIEW COMPARISON:  Earlier film 02/02/2015. FINDINGS: The tracheostomy tube is in good position with the tip at the mid tracheal level. The right IJ catheter is stable. Stable mediastinal adenopathy/mass and persistent left lower lobe infiltrate/atelectasis/effusion. IMPRESSION: Tracheostomy tube in good position without complicating features. Electronically Signed   By: Marijo Sanes  M.D.   On: 02/02/2015 12:18   Dg Chest Port 1 View  02/02/2015  CLINICAL DATA:  Shortness of breath. EXAM: PORTABLE CHEST 1 VIEW COMPARISON:  02/01/2015 FINDINGS: Examination is partially limited by leftward patient rotation. Endotracheal tube terminates above the level of the thoracic inlet and has likely been slightly retracted in the interim. Enteric tube courses towards the left upper abdomen with tip not imaged. Right jugular central venous catheter remains, projecting over the SVC. Known, large mediastinal mass is again seen. Cardiac silhouette remains enlarged. Left chest tube remains in place. No pneumothorax is identified. Left basilar opacities have slightly improved. Mild right basilar opacity is unchanged may reflect atelectasis.  IMPRESSION: 1. Mild interval retraction of the endotracheal tube which projects above the thoracic inlet. Consider advancement. 2. Minimally improved aeration of the left lung base. No pneumothorax. Electronically Signed   By: Logan Bores M.D.   On: 02/02/2015 07:18   Dg Chest Port 1 View  02/01/2015  CLINICAL DATA:  Pericardial window.  Pericardial effusion. EXAM: PORTABLE CHEST 1 VIEW COMPARISON:  01/31/2015 FINDINGS: Endotracheal tube in good position. NG tube in place with the tip not visualized due to underpenetration. Right jugular central venous catheter tip in the SVC. No pneumothorax on the right Interval improvement in aeration of the left lung with decrease in density left lower lobe. This may be due to decreased diffusion atelectasis. Left chest tube remains in place. No pneumothorax on the left. Cardiac enlargement has improved. Right lower lobe atelectasis is present. IMPRESSION: Endotracheal tube in good position. Decreased left lower lobe density with improved aeration of the left lung. Left chest tube in place without pneumothorax. Electronically Signed   By: Franchot Gallo M.D.   On: 02/01/2015 07:16   Dg Chest Port 1 View  01/31/2015  CLINICAL DATA:  Status post median sternotomy and pericardial window creation for pleural effusion, acute respiratory failure, cardiac tamponade, mediastinal mass. EXAM: PORTABLE CHEST 1 VIEW COMPARISON:  Portable chest x-ray of January 27, 2015 FINDINGS: The trachea has apparently been extubated. There is linear radiodensity that projects over the lower neck which could be a portion of the endotracheal tube but if so it is positioned quite high. The right lung remains well-expanded and clear. On the left only a small amount of aerated lung persists in the apex and laterally. The left chest tube is stable. The large mediastinal mass is unchanged. There is no pneumothorax nor definite pleural effusion. The esophagogastric tube tip projects below the inferior  margin of the image. The right internal jugular venous catheter tip projects over the midportion of the SVC. IMPRESSION: Stable appearance of the chest following extubation of the trachea. Persistent large mediastinal mass. There is no pneumothorax. Electronically Signed   By: David  Martinique M.D.   On: 01/31/2015 07:33   Dg Chest Port 1 View  01/30/2015  CLINICAL DATA:  Persistent fever. Status post pericardial window and mediastinal biopsy. EXAM: PORTABLE CHEST 1 VIEW COMPARISON:  Multiple chest x-rays and chest CT from 01/19/2015 FINDINGS: The need support apparatus is stable. The endotracheal tube is 4.3 cm above the carina. The NG tube is stable. The left-sided chest tube is stable. Persistent large mediastinal mass occupying good portion of the left hemi thorax. Persistent vascular congestion and bibasilar atelectasis. IMPRESSION: Stable support apparatus. Stable left-sided chest tube.  No definite pneumothorax. Stable large mass occupying a good portion of the left hemi thorax. Electronically Signed   By: Marijo Sanes M.D.   On:  01/30/2015 23:30   Dg Chest Port 1 View  01/30/2015  CLINICAL DATA:  Intubated patient, acute respiratory failure and hypoxia, history of mediastinal mass, pericardial effusion and cardiac tamponade EXAM: PORTABLE CHEST 1 VIEW COMPARISON:  Portable chest x-ray of January 29, 2015. FINDINGS: Stable volume loss on the left with large mediastinal mass. The left-sided chest tubes are unchanged in position. There is no pneumothorax nor large pleural effusion. The right lung is well-expanded. Mild interstitial prominence throughout the right lung is stable. The pulmonary vascularity is not engorged. The endotracheal tube tip lies 3.8 cm above the carina. The right internal jugular venous catheter tip projects over the midportion of the SVC. The esophagogastric tube tip projects below the inferior margin of the image. IMPRESSION: There has been no significant change in the appearance of  the chest since yesterday's study. The support tubes are in reasonable position. Electronically Signed   By: David  Martinique M.D.   On: 01/30/2015 07:21   Dg Chest Port 1 View  01/29/2015  CLINICAL DATA:  Shortness of breath, acute respiratory failure, intubated patient, history of mediastinal mass, pericardial effusion, and cardiac tamponade. EXAM: PORTABLE CHEST 1 VIEW COMPARISON:  Portable chest x-ray of January 28, 2015 FINDINGS: The right lung is well-expanded. There is a trace of pleural fluid at the right lung base. On the left the cardiac silhouette occupies much of the hemi thorax. A small amount of aerated lung in the apex and inferior laterally is still demonstrated. The endotracheal tube tip projects approximately 3.7 cm above the carina. The esophagogastric tube tip projects below the inferior margin of the image. A left-sided chest tube has its tip located over the lateral aspect of the approximately 6 rib and is stable. A second tube versus the tip of the esophagogastric tube is noted medially at the left lung base. IMPRESSION: Persistent large mediastinal mass on the left. Slight interval worsening in volume loss on the left. The cardiac silhouette remains enlarged. No pneumothorax nor large left pleural effusion is observed. The right lung is largely clear. Electronically Signed   By: David  Martinique M.D.   On: 01/29/2015 07:25   Dg Chest Port 1 View  01/28/2015  CLINICAL DATA:  ET tube placement EXAM: PORTABLE CHEST 1 VIEW COMPARISON:  01/27/15 FINDINGS: The ET tube tip is stable above the carina. There is a right IJ catheter with tip in the projection of the SVC. Left-sided chest tube is identified. No pneumothorax. Stable cardiac enlargement and stable large anterior mediastinal mass. No change in small bilateral pleural effusions and pulmonary edema consistent with CHF. IMPRESSION: 1. Left chest tube in place without visible pneumothorax. 2. Large anterior mediastinal mass 3. Bilateral pleural  effusions and pulmonary edema. Electronically Signed   By: Kerby Moors M.D.   On: 01/28/2015 08:28   Dg Chest Port 1 View  01/27/2015  CLINICAL DATA:  Pericardial effusion EXAM: PORTABLE CHEST 1 VIEW COMPARISON:  02/05/2015 FINDINGS: ET tube tip is stable above the carina. There is a right IJ catheter with tip in the projection of the SVC. Left chest tube is in place. No pneumothorax. NG tube tip is in the stomach. Cardiac enlargement and large anterior mediastinal mass is again noted. Unchanged from previous exam. Increase in pleural fluid noted bilaterally. IMPRESSION: 1. Stable support apparatus. 2. Left chest tube without pneumothorax. 3. Increase in bilateral pleural effusions. Electronically Signed   By: Kerby Moors M.D.   On: 01/27/2015 09:00   Dg Chest Portable 1 View  02/09/2015  CLINICAL DATA:  Status post pericardial window and Chamberlain procedure for large mediastinal mass with associated pericardial effusion. EXAM: PORTABLE CHEST 1 VIEW COMPARISON:  CT of the chest on 01/19/2015 FINDINGS: Endotracheal tube present with the tip approximately 2.5 cm above the carina. Right jugular central line has been placed with the catheter tip in the SVC. Pericardial drain and left-sided chest tube present. Nasogastric tube extends into the distal esophagus and does not extend below the diaphragm. No pneumothorax identified. Lung volumes are very low bilaterally. Huge mediastinal mass again visualized which is more prominent to the left of midline. IMPRESSION: 1. No evidence of pneumothorax postoperatively. 2. Low lung volumes. 3. Nasogastric tube tip lies in the distal esophagus. Electronically Signed   By: Aletta Edouard M.D.   On: 02/19/2015 14:30   Dg Abd Portable 1v  02/08/2015  CLINICAL DATA:  Nausea and vomiting. History of lymphoma and melanoma. EXAM: PORTABLE ABDOMEN - 1 VIEW COMPARISON:  02/05/2015. FINDINGS: Scattered air throughout the small bowel and colon and some residual contrast in  the colon. Findings suggest a mild ileus. IMPRESSION: Persistent mild ileus bowel gas pattern. Electronically Signed   By: Marijo Sanes M.D.   On: 02/08/2015 10:09   Dg Abd Portable 1v  02/05/2015  CLINICAL DATA:  NG tube placement. EXAM: PORTABLE ABDOMEN - 1 VIEW COMPARISON:  CT scan 02/01/2015 FINDINGS: The NG tube is in the body region of the stomach. Scattered air in the small bowel and colon along with some contrast in the colon could suggest a mild ileus. No free air. Bibasilar atelectasis. IMPRESSION: NG tube in good position in the body region the stomach. Moderate air throughout the bowel may suggest a diffuse ileus. Electronically Signed   By: Marijo Sanes M.D.   On: 02/05/2015 13:47  Noninvasive Vascular Lab  Upper Extremity Venous Duplex Study  Patient: Maria Bauer, Maria Bauer MR #: [id number] Study Date: 02/08/2015 Gender: F Age: 69 Height: Weight: BSA: Pt. Status: Room: [place], RVS ADMITTING [place], [place], [place] D ORDERING Mannam, Praveen REFERRING Mannam, Praveen  Reports also to:  ------------------------------------------------------------------- History and indications:  Indications  782.3 Edema. History  Diagnostic evaluation. Edema of the right arm. Edema of the left arm. Status post pericardial windoe and left anterior thorotomy mediastinal Idelia Salm Biopsy - thyoma.  ------------------------------------------------------------------- Summary:  - No evidence of deep vein thrombosis involving the visualized  veins of the right upper extremity. - No obvious evidence of superficial thrombosis of the right upper  extremity however was unable to visualize the cephalic vein  probably secondary to size. - Unable to visualize the right internal jugular due to  tracheostomy strap and IV bandaging. - Left- Technically limited due to patient  becoming agitated and  moving. - Findings consistent with acute deep vein thrombosis involving the  internal jugular, subclavian and proximal axillary veins of the  left upper extremity. - Unable to visualize the ulnar, cephalic , and basilic veins due  to constant movement and probable size.   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 LDH level 950. 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 Cytogenetics and FISH studies pending. Low CD20 positivity which can be seen about 16-20% of diffuse large B-cell lymphoma is and could potentially portend  a poor prognosis independent of the IPI index. (Blood 2009 [id number] Hilaria Ota et al.) Cannot r/o a double hit lymphoma - FISH for BCL2 and cMYC pending results. Blood counts stable. On presentation had difficulty with breathing due to airway compression. 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 Decreasing LDH levels suggestive of response to treatment. Patient had better weaning trials today. S/p first cycle of CHOP on 02/02/2015 and Rituxan 02/05/2015. LDH down from 950 to about 400 suggesting good response to treatment thus far but still continues to have multifactorial challenges with weaning. Plan -continue to monitor cbc daily. Would consider G-CSF if she became neutropenic -ongoing weaning trials as per pulmonary/CCM team - appreciate excellent cares.in unable to wean and the patient has persistent left lower lobe atelectasis ?role for bronchoscopy to evaluate for persistent airway compression and possible airway stenting  and clearance of any possible mucous plug/secretions. -continue Allopurinol for TLS prophylaxis  at reduced dose to 150mg  po daily (LFts improving) - will discontinue by day 14 if no evidence of significant TLS. -if double hit lymphoma or even based on high risk features might consider EPOCH-R from cycle 2 depending on how patient does. Cycle 2 if scheduled for 12/2. Might do it a little earlier depending on clinical course and how her blood count look like at their nadir that would typically occur day 10-14 post chemotherapy. -Will need outpatient PET/CT scan. -We'll continue to follow daily  #2 ?HCAP/ventilator acquired pneumonia - elevated procalcitonin --- improving. Strept in secretions treated with Ceftriaxone - now off antibiotics. ?underlying sleep apnea - given body habitus. Might have lower O2 sats at baseline. -antibiotics/weaning trials and vent management as per critical care team.  #3 hypernatremia -resolved. likely due to free water deficit .sodium levels improving.  Intake/Output Summary (Last 24 hours) at 02/09/15 0451 Last data filed at 02/09/15 0200  Gross per 24 hour  Intake 1750.6 ml  Output   3175 ml  Net -1424.4 ml   Fluid and electrolyte management as per critical care team.  #4 Previous history of upper extremity melanoma status post resection couple of years ago. Family notes that she was [facility] with repeat scans that showed no evidence of metastatic disease or recurrence. She was last evaluated about a year ago.  #5 history of rheumatoid arthritis previously on methotrexate  #6 Hyperglycemia due to steroids ?h/o DM2. Improving control off steroids.  #7 LUE swelling - acute DVT noted on [place]- like related to left innomiate venous compression by mediastinal mass and hypercoag state from lymphoma, hospitalization obesity etc.  Plan -on iv heparin currently. -would need to transition to Lovenox eventually if renal function stable.  Oncology will continue to follow daily.  Plz call if any additional questions.  Sullivan Lone MD [place] AAHIVMS [place]  [facility] Hematology/Oncology [facility]  (Office):       [phone] (Work cell):  [phone] (Fax):           [phone]

## 2015-02-09 NOTE — Progress Notes (Signed)
Sutures removed  No complications   Lurline Idol ties are secure

## 2015-02-09 NOTE — Progress Notes (Signed)
eLink Physician-Brief Progress Note Patient Name: Maria Bauer DOB: 1946-01-07 MRN: [id number]   Date of Service  02/09/2015  HPI/Events of Note  Tachy agitation  eICU Interventions  fent Ativan already ordered 12 lead May need BB     Intervention Category Major Interventions: Arrhythmia - evaluation and management  FEINSTEIN,DANIEL J. 02/09/2015, 8:07 PM

## 2015-02-09 NOTE — Progress Notes (Signed)
ANTICOAGULATION CONSULT NOTE - Follow Up Consult  Pharmacy Consult for heparin Indication: DVT  Allergies  Allergen Reactions  . Morphine And Related Hives and Itching  . Norco [Hydrocodone-Acetaminophen] Hives and Itching   Patient Measurements: Height: 5\' 4"  (162.6 cm) Weight: 212 lb 8.4 oz (96.4 kg) IBW/kg (Calculated) : 54.7 Heparin Dosing Weight: 77kg  Vital Signs: Temp: 100.3 F (37.9 C) (11/18 0400) Temp Source: Oral (11/18 0400) BP: 109/47 mmHg (11/18 0500) Pulse Rate: 88 (11/18 0500)  Labs:  Recent Labs  02/07/15 0430 02/08/15 0505 02/08/15 2000 02/09/15 0508  HGB 9.1* 9.7*  --  9.5*  HCT 29.8* 31.7*  --  30.3*  PLT 282 269  --  193  HEPARINUNFRC  --   --  0.59 0.48  CREATININE 0.33* <0.30*  --  0.34*   Estimated Creatinine Clearance: 73.8 mL/min (by C-G formula based on Cr of 0.34).  Medical History: Past Medical History  Diagnosis Date  . High cholesterol   . Hypertension   . Acid reflux   . Insomnia   . Scoliosis   . Asthma     "stopped in the 1990's"  . Anemia   . Peptic ulcer   . Daily headache   . Rheumatoid arthritis ([place])     "everywhere" (01/31/2015)  . Chronic lower back pain     "mostly on the right" (02/02/2015)  . Anxiety   . Melanoma ([place]) 11/2011    "left forearm"  . Diffuse large B cell lymphoma (HCC)    Assessment: [name] with large mediastinal mass and pericardial effusion, s/p pericardial window 11/4.  Biopsy revealed B-cell lymphoma s/p 1st cycle of R-CHOP.  She remains on ventilator with trach.  Noted to have swollen LUE 11/16. UE dopplers reveal DVT in internal jugular, subclavian, and proximal axillary veins. Enoxaparin 40mg  given at 10am today. RN noted large bruise on hip, no change with start of Heparin.  Labs, 02/09/2015   Heparin level therapeutic at 0.48 with rate at 1300 units/hr. This is the second level within goal range at this rate.  Hgb 9.5, stable. Platelets decreased to 193K.  Could be in response to chemo.   No bleeding or complications noted.  Goal of Therapy:  Heparin level 0.3-0.7 units/ml Monitor platelets by anticoagulation protocol: Yes   Plan:   Continue heparin infusion at 1300 units/hr.  Daily CBC and heparin levels  Await long-term plans for anticoagulation  Hershal Coria, PharmD, BCPS Pager: [phone] 02/09/2015 7:36 AM

## 2015-02-09 NOTE — Progress Notes (Signed)
Attempted to call daughter for update.  Message left for return call.     Noe Gens, NP-C [place] [facility] Pgr: [phone] or if no answer [phone] 02/09/2015, 9:41 AM

## 2015-02-09 NOTE — Progress Notes (Signed)
Name: Maria Bauer MRN: [id number] DOB: 08-31-1945    ADMISSION DATE:  02/15/2015  CONSULTATION DATE:  02/13/2015  REFERRING MD :  Dr. 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  SIGNIFICANT EVENTS  11/03 Admit 11/04 To OR for pericardial window and mediastinal mass bx >> remains on vent post-op 11/08 Fever, started Abx 11/09 Off pressors 11/10 Oncology consulted 11/11 [name]); transfer to [facility] for chemo (R-CHOP) 11/13  Remains on sedation.  Tolerated pressure support. 11/14  Episode of SVT with suctioning overnight.  Did not tolerated PSV this am due to anxiety. 11/17  Weaned on PSV until 1700 yesterday, vomiting overnight x2.  LUE swollen ([place] pending).  STUDIES:  10/28 CT chest >> 12 cm anterior mediastinal mass, mod pericardial effusion 11/10 Mediastinal mass bx >> diffuse large B-cell lymphoma 11/17 UE Duplex >> neg RUE, acute DVT involving the internal jugular, subclavian and proximal axillary veins   SUBJECTIVE:   RN reports pt continues to be tachycardic, intermittent agitation.  Heparin gtt started for LUE acute DVT.  Net negative 1600 for last 24 hours.  Tmax 100.8, neutropenia noted.  VITAL SIGNS: BP 109/47 mmHg  Pulse 88  Temp(Src) 100.3 F (37.9 C) (Oral)  Resp 13  Ht 5\' 4"  (1.626 m)  Wt 212 lb 8.4 oz (96.4 kg)  BMI 36.46 kg/m2  SpO2 99%   VENT SETTINGS: Vent Mode:  [-] PCV FiO2 (%):  [40 %] 40 % Set Rate:  [12 bmp] 12 bmp PEEP:  [5 cmH20] 5 cmH20 Pressure Support:  [5 cmH20] 5 cmH20 Plateau Pressure:  [17 cmH20-19 cmH20] 19 cmH20   INTAKE/OUTPUT: I/O last 3 completed shifts: In: 3314.6 [I.V.:1924.6; NG/GT:1390] Out: 4250 [Urine:4250]  PHYSICAL EXAMINATION:  General: elderly female in NAD on vent, sedate  HENT: trach site with mild erythema, clear secretions at site PULM: even/non-labored, diminished lower, otherwise few  rhonchi CV: s1s2 rrr, no m/r/g, ST on monitor, suture site anterior chest c/d/i, second site with sutures intact leaking yellow fluid GI: soft, non tender MSK: generalized edema, LUE swelling  Neuro: sedate, raises eyebrows to voice, generalized weakness  CBC Recent Labs     02/07/15  0430  02/08/15  0505  02/09/15  0508  WBC  8.8  6.2  0.5*  HGB  9.1*  9.7*  9.5*  HCT  29.8*  31.7*  30.3*  PLT  282  269  193   Coag's No results for input(s): APTT, INR in the last 72 hours.  BMET Recent Labs     02/07/15  0430  02/08/15  0505  02/09/15  0508  NA  148*  144  142  K  4.3  4.3  3.8  CL  113*  103  99*  CO2  32  35*  35*  BUN  42*  30*  27*  CREATININE  0.33*  <0.30*  0.34*  GLUCOSE  161*  162*  151*    Electrolytes Recent Labs     02/07/15  0430  02/08/15  0505  02/09/15  0508  CALCIUM  8.6*  8.6*  8.6*  MG   --   2.1   --   PHOS   --    --   3.4   Sepsis Markers Recent Labs     02/08/15  0505  02/09/15  0508  PROCALCITON  0.18  0.39  ABG No results for input(s): PHART, PCO2ART, PO2ART in the last 72 hours.   Liver Enzymes Recent Labs     02/07/15  0430  02/08/15  1445  AST  26  28  ALT  38  57*  ALKPHOS  81  85  BILITOT  2.7*  2.0*  ALBUMIN  2.1*  2.1*   Glucose Recent Labs     02/08/15  0739  02/08/15  1156  02/08/15  1552  02/08/15  2021  02/08/15  2317  02/09/15  0352  GLUCAP  143*  191*  97  215*  142*  138*    Imaging Dg Chest Port 1 View  02/08/2015  CLINICAL DATA:  Mediastinal mass. EXAM: PORTABLE CHEST 1 VIEW COMPARISON:  Earlier today FINDINGS: Tracheostomy tube tip is above the carina. There is a right IJ catheter with tip in the projection of the cavoatrial junction. There is a nasogastric tube with tip in the stomach. Cardiac enlargement and aortic atherosclerosis noted left perihilar and left base opacification is unchanged from previous exam. IMPRESSION: 1. No change in aeration to left lung compared with prior exam. 2.  Stable support apparatus. Electronically Signed   By: Kerby Moors M.D.   On: 02/08/2015 16:55   Dg Chest Port 1 View  02/08/2015  CLINICAL DATA:  History of diffuse large B-cell lymphoma and melanoma. Mediastinal mass. EXAM: PORTABLE CHEST 1 VIEW COMPARISON:  02/08/2015 and 02/07/2015 FINDINGS: Tracheostomy tube and right IJ central venous catheter unchanged. Nasogastric tube courses into the stomach and off the inferior portion of the film. Stable opacification of the left perihilar region and left base. Right lung is clear. Remainder of the exam is unchanged. IMPRESSION: Stable opacification over the left perihilar region and left base. Tubes and lines unchanged. Electronically Signed   By: Marin Olp M.D.   On: 02/08/2015 10:23   Dg Chest Port 1 View  02/08/2015  CLINICAL DATA:  Acute respiratory failure. EXAM: PORTABLE CHEST 1 VIEW COMPARISON:  February 07, 2015. FINDINGS: Tracheostomy and nasogastric tubes are unchanged in position. Stable cardiomegaly. Right internal jugular catheter is noted with distal tip in expected position of the SVC. No pneumothorax is noted. Mild central pulmonary vascular congestion is noted. Stable left perihilar and basilar opacity is noted concerning for atelectasis or pneumonia with possible associated pleural effusion. IMPRESSION: Stable support apparatus. Stable left perihilar and basilar opacity is noted concerning for pneumonia or atelectasis with associated pleural effusion. Electronically Signed   By: Marijo Conception, M.D.   On: 02/08/2015 07:26   Dg Abd Portable 1v  02/08/2015  CLINICAL DATA:  Nausea and vomiting. History of lymphoma and melanoma. EXAM: PORTABLE ABDOMEN - 1 VIEW COMPARISON:  02/05/2015. FINDINGS: Scattered air throughout the small bowel and colon and some residual contrast in the colon. Findings suggest a mild ileus. IMPRESSION: Persistent mild ileus bowel gas pattern. Electronically Signed   By: Marijo Sanes M.D.   On: 02/08/2015 10:09      ASSESSMENT / PLAN:  PULMONARY Trach 11/11 >> A: Acute respiratory failure. Failure to wean from vent s/p trach. Concern for aspiration - vomiting overnight 11/17, ? TF suctioned from trach  P: Pressure support wean to trach collar as tolerated  Change to pressure control as rest mode  F/u CXR intermittently D/C trach sutures  Repeat lasix, 40 mg   CARDIOVASCULAR: Rt IJ CVL 11/04 >> A: Malignant pericardial effusion s/p window. Hypotension likely from hypovolemia and sedation >> improved. Hx of HLD, HTN.  P: Continue outpatient lopressor.  Has been hypertensive at times but suspect related to agitation.  Continue zocor  Monitor hemodynamics Follow BP trend closely given cessation of steroids (pulse steroids, monitor for AI) Monitor incision sites / drainage  RENAL A:  Hypokalemia. Hypernatremia. P: Replace electrolytes as indicated Continue free water, 250 Q6 D5 1/2NS @ 10 ml/hr Lasix as above  GASTROENTEROLOGY:  A:  Nutrition. Gastroparesis. Mild Ileus - noted on KUB 11/17 Nausea / Vomiting - new, 11/17.  2 episodes overnight, ? aspiration P:  Tube feeding per Nutrition  Monitor abdominal exam Zantac for SUP Reglan 5mg  IV Q8 x 4 doses (QT 0.29, QTc 0.41 11/16) PRN zofran for nausea / vomiting   HEMATOLOGY/ONCOLOGY: A: B cell lymphoma. Anemia of critical illness. LUE Swelling - positive for DVT 11/17 in L IJ, L [place], L axillary veins Neutropenia - new 11/18 P: Chemotherapy (R-CHOP) >> completed 1st cycle 11/11, Rituxan 11/14 Previously used solu-medrol in place of prednisone  ONC notes reflect high dose steroid need completed 11/15.   Allopurinol for tumor lysis syndrome prophylaxis F/u CBC Lovenox for DVT prevention Heparin gtt for DVT   ID:  A: Fever, tracheobronchitis. P: Day 8/8 of Abx, changed to rocephin 11/12 D/C abx 11/16   Blood 11/09 >> neg Sputum 11/09 >> Moderate Streptococcus, beta hemolytic  Assess PCT, monitor off abx (?  Aspiration)  ENDOCRINE: A: Steroid induced hyperglycemia. P: SSI  Reduce levemir to 25 units   NEUROLOGY:  A:  Agitation. Anxiety P: RASS goal 0 PRN fentanyl for pain  PRN ativan for agitation  Resume precedex gtt 11/18 for agitation.  She previously has been on precedex / fentanyl gtt's and agitation did not change despite  dosing.  Also had attempted scheduled ativan as she is on at home but it also did not change her intermittent agitation.  Follow serial exam.     RHEUMATOLOGY: A: Hx of RA >> was on MTX as outpt. P: Will need to re-assess as outpt    Noe Gens, NP-C [place] [facility] Pgr: [phone] or if no answer [phone] 02/09/2015, 8:13 AM

## 2015-02-10 ENCOUNTER — Inpatient Hospital Stay (HOSPITAL_COMMUNITY): Payer: Medicare (Managed Care)

## 2015-02-10 DIAGNOSIS — M069 Rheumatoid arthritis, unspecified: Secondary | ICD-10-CM

## 2015-02-10 LAB — PROCALCITONIN: PROCALCITONIN: 1.34 ng/mL

## 2015-02-10 LAB — COMPREHENSIVE METABOLIC PANEL
ALK PHOS: 85 U/L (ref 38–126)
ALT: 33 U/L (ref 14–54)
ANION GAP: 10 (ref 5–15)
AST: 16 U/L (ref 15–41)
Albumin: 2.3 g/dL — ABNORMAL LOW (ref 3.5–5.0)
BUN: 21 mg/dL — ABNORMAL HIGH (ref 6–20)
CALCIUM: 8 mg/dL — AB (ref 8.9–10.3)
CHLORIDE: 99 mmol/L — AB (ref 101–111)
CO2: 33 mmol/L — AB (ref 22–32)
Creatinine, Ser: 0.39 mg/dL — ABNORMAL LOW (ref 0.44–1.00)
GFR calc non Af Amer: >60 mL/min (ref >60)
Glucose, Bld: 103 mg/dL — ABNORMAL HIGH (ref 65–99)
POTASSIUM: 3.6 mmol/L (ref 3.5–5.1)
SODIUM: 142 mmol/L (ref 135–145)
Total Bilirubin: 2.4 mg/dL — ABNORMAL HIGH (ref 0.3–1.2)
Total Protein: 5.1 g/dL — ABNORMAL LOW (ref 6.5–8.1)

## 2015-02-10 LAB — CBC WITH DIFFERENTIAL/PLATELET
BASOS ABS: 0 10*3/uL (ref 0.0–0.1)
BASOS PCT: 0 %
EOS ABS: 0 10*3/uL (ref 0.0–0.7)
EOS PCT: 0 %
HCT: 27.2 % — ABNORMAL LOW (ref 36.0–46.0)
Hemoglobin: 8.6 g/dL — ABNORMAL LOW (ref 12.0–15.0)
Lymphocytes Relative: 64 %
Lymphs Abs: 0.1 10*3/uL — ABNORMAL LOW (ref 0.7–4.0)
MCH: 30.6 pg (ref 26.0–34.0)
MCHC: 31.6 g/dL (ref 30.0–36.0)
MCV: 96.8 fL (ref 78.0–100.0)
MONO ABS: 0 10*3/uL — AB (ref 0.1–1.0)
Monocytes Relative: 9 %
NEUTROS ABS: 0 10*3/uL — AB (ref 1.7–7.7)
Neutrophils Relative %: 27 %
PLATELETS: 137 10*3/uL — AB (ref 150–400)
RBC: 2.81 MIL/uL — ABNORMAL LOW (ref 3.87–5.11)
RDW: 15 % (ref 11.5–15.5)
WBC: 0.1 10*3/uL — CL (ref 4.0–10.5)

## 2015-02-10 LAB — CBC
HCT: 26.9 % — ABNORMAL LOW (ref 36.0–46.0)
Hemoglobin: 8.4 g/dL — ABNORMAL LOW (ref 12.0–15.0)
MCH: 30 pg (ref 26.0–34.0)
MCHC: 31.2 g/dL (ref 30.0–36.0)
MCV: 96.1 fL (ref 78.0–100.0)
PLATELETS: 112 10*3/uL — AB (ref 150–400)
RBC: 2.8 MIL/uL — AB (ref 3.87–5.11)
RDW: 14.9 % (ref 11.5–15.5)
WBC: 0.3 10*3/uL — AB (ref 4.0–10.5)

## 2015-02-10 LAB — GLUCOSE, CAPILLARY
GLUCOSE-CAPILLARY: 92 mg/dL (ref 65–99)
GLUCOSE-CAPILLARY: 95 mg/dL (ref 65–99)
GLUCOSE-CAPILLARY: 70 mg/dL (ref 65–99)
Glucose-Capillary: 120 mg/dL — ABNORMAL HIGH (ref 65–99)
Glucose-Capillary: 83 mg/dL (ref 65–99)
Glucose-Capillary: 88 mg/dL (ref 65–99)
Glucose-Capillary: 96 mg/dL (ref 65–99)

## 2015-02-10 LAB — HEPARIN LEVEL (UNFRACTIONATED)
HEPARIN UNFRACTIONATED: 0.25 [IU]/mL — AB (ref 0.30–0.70)
HEPARIN UNFRACTIONATED: 0.14 [IU]/mL — AB (ref 0.30–0.70)

## 2015-02-10 LAB — OCCULT BLOOD GASTRIC / DUODENUM (SPECIMEN CUP)
Occult Blood, Gastric: POSITIVE — AB
PH, GASTRIC: 3

## 2015-02-10 MED ORDER — VANCOMYCIN HCL 10 G IV SOLR
1500.0000 mg | Freq: Once | INTRAVENOUS | Status: AC
  Administered 2015-02-10: 1500 mg via INTRAVENOUS
  Filled 2015-02-10: qty 1500

## 2015-02-10 MED ORDER — SODIUM CHLORIDE 0.9 % IV SOLN
1250.0000 mg | Freq: Two times a day (BID) | INTRAVENOUS | Status: DC
  Administered 2015-02-10 – 2015-02-12 (×5): 1250 mg via INTRAVENOUS
  Filled 2015-02-10 (×6): qty 1250

## 2015-02-10 MED ORDER — METOPROLOL TARTRATE 25 MG PO TABS: 25.0000 mg | ORAL_TABLET | Freq: Two times a day (BID) | ORAL | Status: DC

## 2015-02-10 MED ORDER — PIPERACILLIN-TAZOBACTAM 3.375 G IVPB
3.3750 g | Freq: Three times a day (TID) | INTRAVENOUS | Status: DC
  Administered 2015-02-10 – 2015-02-14 (×14): 3.375 g via INTRAVENOUS
  Filled 2015-02-10 (×14): qty 50

## 2015-02-10 MED ORDER — METOPROLOL TARTRATE 25 MG PO TABS
37.5000 mg | ORAL_TABLET | Freq: Two times a day (BID) | ORAL | Status: DC
  Administered 2015-02-11 – 2015-02-14 (×5): 37.5 mg via ORAL
  Filled 2015-02-10 (×5): qty 1

## 2015-02-10 MED ORDER — HEPARIN (PORCINE) IN NACL 100-0.45 UNIT/ML-% IJ SOLN
1650.0000 [IU]/h | INTRAMUSCULAR | Status: DC
  Administered 2015-02-10 – 2015-02-11 (×2): 1650 [IU]/h via INTRAVENOUS
  Filled 2015-02-10 (×2): qty 250

## 2015-02-10 MED ORDER — CHLORHEXIDINE GLUCONATE 0.12 % MT SOLN
OROMUCOSAL | Status: AC
  Administered 2015-02-10: 15 mL via OROMUCOSAL
  Filled 2015-02-10: qty 15

## 2015-02-10 MED ORDER — METOPROLOL TARTRATE 1 MG/ML IV SOLN
INTRAVENOUS | Status: AC
  Administered 2015-02-10: 2.5 mg via INTRAVENOUS
  Filled 2015-02-10: qty 5

## 2015-02-10 MED ORDER — SODIUM CHLORIDE 0.9 % IV SOLN
12.5000 ug/h | INTRAVENOUS | Status: AC
  Administered 2015-02-10 – 2015-02-11 (×2): 10 ug/h via INTRAVENOUS
  Filled 2015-02-10 (×2): qty 50

## 2015-02-10 MED ORDER — SODIUM CHLORIDE 0.9 % IV BOLUS (SEPSIS)
500.0000 mL | Freq: Once | INTRAVENOUS | Status: AC
  Administered 2015-02-10: 500 mL via INTRAVENOUS

## 2015-02-10 MED ORDER — METOPROLOL TARTRATE 1 MG/ML IV SOLN
2.5000 mg | Freq: Four times a day (QID) | INTRAVENOUS | Status: DC | PRN
  Administered 2015-02-10 (×2): 2.5 mg via INTRAVENOUS
  Filled 2015-02-10 (×2): qty 5

## 2015-02-10 MED ORDER — DEXTROSE 50 % IV SOLN
INTRAVENOUS | Status: AC
  Administered 2015-02-10: 25 mL
  Filled 2015-02-10: qty 50

## 2015-02-10 MED ORDER — FENTANYL CITRATE (PF) 100 MCG/2ML IJ SOLN
50.0000 ug | INTRAMUSCULAR | Status: DC | PRN
  Administered 2015-02-12 (×2): 25 ug via INTRAVENOUS
  Administered 2015-02-13: 50 ug via INTRAVENOUS
  Administered 2015-02-13: 25 ug via INTRAVENOUS
  Administered 2015-02-13 – 2015-02-14 (×2): 50 ug via INTRAVENOUS
  Administered 2015-02-14: 25 ug via INTRAVENOUS
  Filled 2015-02-10 (×7): qty 2

## 2015-02-10 MED ORDER — HEPARIN (PORCINE) IN NACL 100-0.45 UNIT/ML-% IJ SOLN
1450.0000 [IU]/h | INTRAMUSCULAR | Status: DC
  Filled 2015-02-10 (×2): qty 250

## 2015-02-10 NOTE — Progress Notes (Signed)
Dr. Lamonte Sakai called in regards to patients blood pressure. MD called previously for low bps (SBP 50s-80s, MAP 30-40s). 500cc bolus ordered and given. MD called two hours later with BPs improved to SBP greater than 90s, but MAP 50s-60s. No new orders given. Metoprolol and Fentanyl drip held per MD.

## 2015-02-10 NOTE — Progress Notes (Signed)
ANTIBIOTIC CONSULT NOTE - INITIAL  Pharmacy Consult for Vancomycin, Zosyn Indication: sepsis, febrile neutropenia  Allergies  Allergen Reactions  . Morphine And Related Hives and Itching  . Norco [Hydrocodone-Acetaminophen] Hives and Itching    Patient Measurements: Height: 5\' 4"  (162.6 cm) Weight: 210 lb 5.1 oz (95.4 kg) IBW/kg (Calculated) : 54.7   Vital Signs: Temp: 101.7 F (38.7 C) (11/19 0900) Temp Source: Axillary (11/19 0900) BP: 79/40 mmHg (11/19 1100) Pulse Rate: 110 (11/19 1100) Intake/Output from previous day: 11/18 0701 - 11/19 0700 In: [id number] [I.V.:613; NG/GT:1000] Out: 2875 [Urine:2325; Emesis/NG output:550] Intake/Output from this shift: Total I/O In: 123 [I.V.:23; NG/GT:100] Out: 125 [Urine:125]  Labs:  Recent Labs  02/08/15 0505 02/09/15 0508 02/10/15 0623  WBC 6.2 0.5* 0.1*  HGB 9.7* 9.5* 8.6*  PLT 269 193 137*  CREATININE <0.30* 0.34* 0.39*   Estimated Creatinine Clearance: 73.3 mL/min (by C-G formula based on Cr of 0.39). No results for input(s): VANCOTROUGH, VANCOPEAK, VANCORANDOM, GENTTROUGH, GENTPEAK, GENTRANDOM, TOBRATROUGH, TOBRAPEAK, TOBRARND, AMIKACINPEAK, AMIKACINTROU, AMIKACIN in the last 72 hours.   Microbiology: Recent Results (from the past 720 hour(s))  MRSA PCR Screening     Status: None   Collection Time: 02/02/2015  5:17 PM  Result Value Ref Range Status   MRSA by PCR NEGATIVE NEGATIVE Final    Comment:        The GeneXpert MRSA Assay (FDA approved for NASAL specimens only), is one component of a comprehensive MRSA colonization surveillance program. It is not intended to diagnose MRSA infection nor to guide or monitor treatment for MRSA infections.   Culture, respiratory (NON-Expectorated)     Status: None   Collection Time: 01/27/15  3:30 PM  Result Value Ref Range Status   Specimen Description TRACHEAL ASPIRATE  Final   Special Requests Normal  Final   Gram Stain   Final    ABUNDANT WBC PRESENT,BOTH PMN AND  MONONUCLEAR RARE SQUAMOUS EPITHELIAL CELLS PRESENT NO ORGANISMS SEEN Performed at [facility]    Culture   Final    NO GROWTH 2 DAYS Performed at [facility]    Report Status 01/29/2015 FINAL  Final  C difficile quick scan w PCR reflex     Status: None   Collection Time: 01/29/15  3:45 PM  Result Value Ref Range Status   C Diff antigen NEGATIVE NEGATIVE Final   C Diff toxin NEGATIVE NEGATIVE Final   C Diff interpretation Negative for toxigenic C. difficile  Final  Culture, blood (routine x 2)     Status: None   Collection Time: 01/30/15 10:18 PM  Result Value Ref Range Status   Specimen Description BLOOD RIGHT ANTECUBITAL  Final   Special Requests BOTTLES DRAWN AEROBIC AND ANAEROBIC 10CC  Final   Culture NO GROWTH 5 DAYS  Final   Report Status 02/04/2015 FINAL  Final  Culture, blood (routine x 2)     Status: None   Collection Time: 01/30/15 10:28 PM  Result Value Ref Range Status   Specimen Description BLOOD RIGHT HAND  Final   Special Requests BOTTLES DRAWN AEROBIC AND ANAEROBIC 10CC  Final   Culture NO GROWTH 5 DAYS  Final   Report Status 02/04/2015 FINAL  Final  Culture, respiratory (NON-Expectorated)     Status: None   Collection Time: 01/31/15 12:42 AM  Result Value Ref Range Status   Specimen Description TRACHEAL ASPIRATE  Final   Special Requests Normal  Final   Gram Stain   Final    MODERATE  WBC PRESENT,BOTH PMN AND MONONUCLEAR RARE SQUAMOUS EPITHELIAL CELLS PRESENT FEW GRAM NEGATIVE RODS RARE GRAM POSITIVE COCCI IN PAIRS IN CLUSTERS RARE GRAM NEGATIVE COCCI    Culture   Final    MODERATE STREPTOCOCCUS,BETA HEMOLYTIC NOT GROUP A Performed at [facility]    Report Status 02/02/2015 FINAL  Final  Culture, blood (routine x 2)     Status: None (Preliminary result)   Collection Time: 02/09/15 10:09 PM  Result Value Ref Range Status   Specimen Description BLOOD LEFT HAND  Final   Special Requests   Final    5 CC AEROBIC BOTTLE  ONLY Performed at [facility]    Culture PENDING  Incomplete   Report Status PENDING  Incomplete    Medical History: Past Medical History  Diagnosis Date  . High cholesterol   . Hypertension   . Acid reflux   . Insomnia   . Scoliosis   . Asthma     "stopped in the 1990's"  . Anemia   . Peptic ulcer   . Daily headache   . Rheumatoid arthritis ([place])     "everywhere" (01/23/2015)  . Chronic lower back pain     "mostly on the right" (02/20/2015)  . Anxiety   . Melanoma ([place]) 11/2011    "left forearm"  . Diffuse large B cell lymphoma (HCC)     Assessment: 71 yoF with mediastinal mass and pericardial effusion 2nd to Large B cell lymphoma, known to pharmacy for heparin dosing, remains in ICU with acute respiratory failure with failure to wean from vent s/p trach.  Finished CTX for strep in sputum, tracheobroncitis and have been monitoring off of antibiotics since 11/16.  Patient continues to have fever, is neutropenic (2/2 chemo) with PCT increasing.  Pharmacy consulted to start Vancomycin and Zosyn.  New blood cultures obtained 11/18.  11/4 >> Cefuroxime (post-op abx) >> 11/8 11/9 >> Ceftazidime >> 11/12 11/12 >> Rocephin >>11/16 11/19 >> Vanc >> 11/19 >> Zosyn >>  11/3 MRSA PCR: negative 11/5 Trach asp: NGF 11/7 Cdiff: Ag negative, Toxin negative 11/8 Blood x2: NGF 11/9 Trach asp: moderate B-hem strep (not group A) - F 11/18 blood cx x 2: collected  Today, 02/10/2015: - Tmax 102.4 - WBC 0.1, ANC 0 - CrCl~74 ml/min (normalized), ~98 ml/min CG  Goal of Therapy:  Vancomycin trough level 15-20 mcg/ml  Doses adjusted per renal function Eradication of infection  Plan:  1.  Vanc 1500 mg IV x 1 then 1250 mg IV q12h. 2.  Zosyn 3.375g IV q8h (4 hour infusion time).  3.  F/u SCr, VT, cx results, clinical course.  Hershal Coria 02/10/2015,11:18 AM

## 2015-02-10 NOTE — Progress Notes (Signed)
PT Cancellation Note  Patient Details Name: Maria Bauer MRN: [id number] DOB: 12-04-1945   Cancelled Treatment:    Reason Eval/Treat Not Completed: Patient not medically ready (PT will sign off; Please re-order when medically stable for PT)  Pt is sedated, on vent, with other medical issues preventing PT at this time;   [facility] 02/10/2015, 9:19 AM

## 2015-02-10 NOTE — Progress Notes (Addendum)
ANTICOAGULATION CONSULT NOTE - Follow Up Consult  Pharmacy Consult for heparin Indication: DVT  Allergies  Allergen Reactions  . Morphine And Related Hives and Itching  . Norco [Hydrocodone-Acetaminophen] Hives and Itching   Patient Measurements: Height: 5\' 4"  (162.6 cm) Weight: 210 lb 5.1 oz (95.4 kg) IBW/kg (Calculated) : 54.7 Heparin Dosing Weight: 77kg  Vital Signs: Temp: 102.9 F (39.4 C) (11/19 0800) Temp Source: Axillary (11/19 0800) BP: 78/41 mmHg (11/19 0800) Pulse Rate: 129 (11/19 0800)  Labs:  Recent Labs  02/08/15 0505 02/08/15 2000 02/09/15 0508 02/10/15 0500 02/10/15 0623  HGB 9.7*  --  9.5*  --  8.6*  HCT 31.7*  --  30.3*  --  27.2*  PLT 269  --  193  --  137*  HEPARINUNFRC  --  0.59 0.48 0.25*  --   CREATININE <0.30*  --  0.34*  --  0.39*   Estimated Creatinine Clearance: 73.3 mL/min (by C-G formula based on Cr of 0.39).  Medical History: Past Medical History  Diagnosis Date  . High cholesterol   . Hypertension   . Acid reflux   . Insomnia   . Scoliosis   . Asthma     "stopped in the 1990's"  . Anemia   . Peptic ulcer   . Daily headache   . Rheumatoid arthritis ([place])     "everywhere" (02/10/2015)  . Chronic lower back pain     "mostly on the right" (02/15/2015)  . Anxiety   . Melanoma ([place]) 11/2011    "left forearm"  . Diffuse large B cell lymphoma (HCC)    Assessment: 3 YOF with large mediastinal mass and pericardial effusion, s/p pericardial window 11/4.  Biopsy revealed B-cell lymphoma s/p 1st cycle of R-CHOP.  She remains on ventilator with trach.  Noted to have swollen LUE 11/16. UE dopplers reveal DVT in internal jugular, subclavian, and proximal axillary veins. Enoxaparin 40mg  given at 10am today. RN noted large bruise on hip, no change with start of Heparin.  Labs, 02/10/2015   Heparin level subtherapeutic at 0.25 with rate at 1300 units/hr.   Hgb 8.6. Platelets decreasing, now 137K. Likely in response to chemo.  Discussed  with RN if patient is having any signs of bleeding.  RN reports that there is some brown secretion from NG tube which is being collected now for testing.  Goal of Therapy:  Heparin level 0.3-0.7 units/ml Monitor platelets by anticoagulation protocol: Yes   Plan:   Increase heparin infusion to 1450 units/hr.  Check heparin level in 8 hours after rate adjusted.  Daily CBC and heparin levels  F/u with RN if pt having any bleeding and results of NG sample.  Hershal Coria, PharmD, BCPS Pager: [phone] 02/10/2015 8:36 AM   Addendum: 02/10/2015 4:22 PM RN reported that the NG sample was positive for blood and Elink was contacted.  MD ordered to check another Hgb this evening.  Continuing heparin infusion and will f/u Hgb, heparin level, and MD assessment/plan for this evening after Hgb results.  Hershal Coria, PharmD, BCPS Pager: [phone] 02/10/2015 4:23 PM

## 2015-02-10 NOTE — Progress Notes (Signed)
Name: Maria Bauer MRN: [id number] DOB: Nov 30, 1945    ADMISSION DATE:  02/12/2015  CONSULTATION DATE:  01/23/2015  REFERRING MD :  Dr. 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  SIGNIFICANT EVENTS  11/03 Admit 11/04 To OR for pericardial window and mediastinal mass bx >> remains on vent post-op 11/08 Fever, started Abx 11/09 Off pressors 11/10 Oncology consulted 11/11 [name]); transfer to [facility] for chemo (R-CHOP) 11/13  Remains on sedation.  Tolerated pressure support. 11/14  Episode of SVT with suctioning overnight.  Did not tolerated PSV this am due to anxiety. 11/17  Weaned on PSV until 1700 yesterday, vomiting overnight x2.  LUE swollen ([place] pending).  STUDIES:  10/28 CT chest >> 12 cm anterior mediastinal mass, mod pericardial effusion 11/10 Mediastinal mass bx >> diffuse large B-cell lymphoma 11/17 UE Duplex >> neg RUE, acute DVT involving the internal jugular, subclavian and proximal axillary veins   SUBJECTIVE:   RN reports pt continues to be tachycardic, intermittent agitation.  Heparin gtt started for LUE acute DVT.  Net negative 1600 for last 24 hours.  Tmax 100.8, neutropenia noted.  VITAL SIGNS: BP 117/74 mmHg  Pulse 151  Temp(Src) 102.4 F (39.1 C) (Oral)  Resp 21  Ht 5\' 4"  (1.626 m)  Wt 95.4 kg (210 lb 5.1 oz)  BMI 36.08 kg/m2  SpO2 100%   VENT SETTINGS: Vent Mode:  [-] PCV FiO2 (%):  [40 %] 40 % Set Rate:  [12 bmp] 12 bmp PEEP:  [5 cmH20] 5 cmH20 Pressure Support:  [5 cmH20] 5 cmH20 Plateau Pressure:  [16 cmH20-19 cmH20] 16 cmH20   INTAKE/OUTPUT: I/O last 3 completed shifts: In: 2420 [I.V.:1120; NG/GT:1300] Out: 3975 [Urine:3425; Emesis/NG output:550]  PHYSICAL EXAMINATION:  General: elderly female in NAD on vent, looks uncomfortable HENT: trach site with mild erythema, large neck,  PULM: even/non-labored, diminished  lower, otherwise few rhonchi, frequent (non-stop) coughing CV: s1s2 rrr, no m/r/g, ST on monitor, suture site anterior chest c/d/i, second site with sutures intact leaking yellow fluid GI: soft, non tender MSK: generalized edema, LUE swelling  Neuro: sedate, raises eyebrows to voice, generalized weakness  CBC Recent Labs     02/08/15  0505  02/09/15  0508  02/10/15  0623  WBC  6.2  0.5*  0.1*  HGB  9.7*  9.5*  8.6*  HCT  31.7*  30.3*  27.2*  PLT  269  193  137*   Coag's No results for input(s): APTT, INR in the last 72 hours.  BMET Recent Labs     02/08/15  0505  02/09/15  0508  02/10/15  0623  NA  144  142  142  K  4.3  3.8  3.6  CL  103  99*  99*  CO2  35*  35*  33*  BUN  30*  27*  21*  CREATININE  <0.30*  0.34*  0.39*  GLUCOSE  162*  151*  103*    Electrolytes Recent Labs     02/08/15  0505  02/09/15  0508  02/10/15  0623  CALCIUM  8.6*  8.6*  8.0*  MG  2.1   --    --   PHOS   --   3.4   --    Sepsis Markers Recent Labs     02/08/15  0505  02/09/15  0508  02/10/15  [id number]  PROCALCITON  0.18  0.39  1.34    ABG No results for input(s): PHART, PCO2ART, PO2ART in the last 72 hours.   Liver Enzymes Recent Labs     02/08/15  1445  02/10/15  0623  AST  28  16  ALT  57*  33  ALKPHOS  85  85  BILITOT  2.0*  2.4*  ALBUMIN  2.1*  2.3*   Glucose Recent Labs     02/09/15  0753  02/09/15  1207  02/09/15  1649  02/09/15  2006  02/09/15  2344  02/10/15  0423  GLUCAP  158*  110*  138*  110*  96  92    Imaging Dg Chest Port 1 View  02/10/2015  CLINICAL DATA:  Acute respiratory failure.  Subsequent encounter. EXAM: PORTABLE CHEST 1 VIEW COMPARISON:  02/08/2015 FINDINGS: Linear and hazy airspace lung opacity projects from the left hilum to the left mid and upper lung and left lung base, similar to the previous day's study. There is diffuse interstitial thickening bilaterally, also stable. No pneumothorax. Tracheostomy tube, right internal jugular  central venous line and orogastric tube are well positioned, without significant change. IMPRESSION: 1. No significant change from the prior study. 2. Persistent opacity in the left lung, which may be due to atelectasis or pneumonia. Persistent bilateral interstitial thickening is stable. No new lung abnormalities. 3. Support apparatus is stable and well positioned. Electronically Signed   By: Lajean Manes M.D.   On: 02/10/2015 07:25   Dg Chest Port 1 View  02/08/2015  CLINICAL DATA:  Mediastinal mass. EXAM: PORTABLE CHEST 1 VIEW COMPARISON:  Earlier today FINDINGS: Tracheostomy tube tip is above the carina. There is a right IJ catheter with tip in the projection of the cavoatrial junction. There is a nasogastric tube with tip in the stomach. Cardiac enlargement and aortic atherosclerosis noted left perihilar and left base opacification is unchanged from previous exam. IMPRESSION: 1. No change in aeration to left lung compared with prior exam. 2. Stable support apparatus. Electronically Signed   By: Kerby Moors M.D.   On: 02/08/2015 16:55   Dg Chest Port 1 View  02/08/2015  CLINICAL DATA:  History of diffuse large B-cell lymphoma and melanoma. Mediastinal mass. EXAM: PORTABLE CHEST 1 VIEW COMPARISON:  02/08/2015 and 02/07/2015 FINDINGS: Tracheostomy tube and right IJ central venous catheter unchanged. Nasogastric tube courses into the stomach and off the inferior portion of the film. Stable opacification of the left perihilar region and left base. Right lung is clear. Remainder of the exam is unchanged. IMPRESSION: Stable opacification over the left perihilar region and left base. Tubes and lines unchanged. Electronically Signed   By: Marin Olp M.D.   On: 02/08/2015 10:23   Dg Abd Portable 1v  02/08/2015  CLINICAL DATA:  Nausea and vomiting. History of lymphoma and melanoma. EXAM: PORTABLE ABDOMEN - 1 VIEW COMPARISON:  02/05/2015. FINDINGS: Scattered air throughout the small bowel and colon and  some residual contrast in the colon. Findings suggest a mild ileus. IMPRESSION: Persistent mild ileus bowel gas pattern. Electronically Signed   By: Marijo Sanes M.D.   On: 02/08/2015 10:09    ASSESSMENT / PLAN:  PULMONARY Trach 11/11 >> A: Acute respiratory failure. Failure to wean from vent s/p trach. Concern for aspiration - vomiting overnight 11/17, ? TF suctioned from trach  P: Pressure support wean to trach collar as tolerated  Pressure control as rest mode  Attempt to get her upright, avoid trach contact  with tracheal wall; continued cough appears to be source of much of her discomfort F/u CXR intermittently Repeat lasix, 40 mg on 11/19  CARDIOVASCULAR: Rt IJ CVL 11/04 >> A: Malignant pericardial effusion s/p window. Hypotension likely from hypovolemia and sedation >> improved but still labile w sedating meds Hx of HLD, HTN. Sinus tachycardia P: Continue outpatient lopressor.  Has been hypertensive at times but suspect related to agitation.  Continue zocor  Monitor hemodynamics Follow BP trend closely given cessation of steroids (pulse steroids, monitor for AI) Monitor incision sites / drainage  RENAL A:  Hypokalemia. Hypernatremia. P: Replace electrolytes as indicated Continue free water, 250 Q6 D5 1/2NS @ 10 ml/hr Lasix as above  GASTROENTEROLOGY:  A:  Nutrition. Gastroparesis. Mild Ileus - noted on KUB 11/17 Nausea / Vomiting - new, 11/17. Coffee grounds from NGT 11/19 P:  Tube feeding per Nutrition  Monitor abdominal exam Zantac for SUP Reglan 5mg  IV Q8 x 4 doses completed  PRN zofran for nausea / vomiting  Heme-check gastric secretions Repeat CBC pm 11/19  HEMATOLOGY/ONCOLOGY: A: B cell lymphoma. Anemia of critical illness. LUE Swelling - positive for DVT 11/17 in L IJ, L [place], L axillary veins Neutropenia - new 11/18 P: Chemotherapy (R-CHOP) >> completed 1st cycle 11/11, Rituxan 11/14 Previously used solu-medrol in place of prednisone,  completed ONC notes reflect high dose steroid need completed 11/15.   Allopurinol for tumor lysis syndrome prophylaxis F/u CBC Heparin gtt for DVT   ID:  A: Fever, tracheobronchitis. P: Treated w ceftriaxone D/C abx 11/16   Blood 11/09 >> neg Sputum 11/09 >> Moderate Streptococcus, beta hemolytic  Assess PCT, monitoring off abx (? Aspiration)  ENDOCRINE: A: Steroid induced hyperglycemia. P: SSI  Levemir to 25 units   NEUROLOGY:  A:  Agitation. Anxiety P: RASS goal 0 PRN fentanyl for pain  Fentanyl patch added 11/18 by oncology PRN ativan for agitation  Agitation / pain has been difficult to manage due to hypotension from meds. Will attempt to balance.    RHEUMATOLOGY: A: Hx of RA >> was on MTX as outpt. P: Will need to re-assess as outpt   Independent CC time 40 minutes   Baltazar Apo, MD, PhD 02/10/2015, 8:05 AM [facility] [phone] or if no answer [phone]

## 2015-02-10 NOTE — Progress Notes (Signed)
Called Elink MD to notify about positive gastric blood occult sample taken from NG tube. Ordered to recheck Hg level at 5p. CBC ordered at 5p along with heparin level. Pharmacy also notified of positive occult sample. Will continue to monitor. Gae Gallop RN 02/10/2015 4:44 PM

## 2015-02-10 NOTE — Progress Notes (Signed)
[facility] Progress Note Patient Name: Maria Bauer DOB: 1945/03/28 MRN: [id number]   Date of Service  02/10/2015  HPI/Events of Note  Multiple issues: 1. HR = 163 (Sinus Tachycardia). Temp has come down to 100.5 and 2. Patient coughing/gaging continually.  eICU Interventions  Will order: 1. Portable CXR now.  2. Metoprolol 2.5 to 5 mg Q 6 hours PRN HR > 130.     Intervention Category Major Interventions: Arrhythmia - evaluation and management Intermediate Interventions: Other:  [name] [name] 02/10/2015, 2:28 AM

## 2015-02-10 NOTE — Progress Notes (Signed)
PHARMACIST - PHYSICIAN COMMUNICATION CONCERNING:  IV heparin  56 yoF on IV heparin for DVT. Please see note written earlier by Hershal Coria, PharmD for more details.    Heparin level continues to trend down despite increases in infusion. Tonight level = 0.14 subtherapeutic.  Hgb and plts also trending down though repeat Hgb after reports of +blood in NG tube was unchanged.  No further bleeding issues according to RN.    RECOMMENDATION: Increase to IV heparin 1650 units/hr Recheck level in 8 hours  Ralene Bathe, PharmD, BCPS 02/10/2015, 7:55 PM  Pager: [phone]

## 2015-02-11 ENCOUNTER — Inpatient Hospital Stay (HOSPITAL_COMMUNITY): Payer: Medicare (Managed Care)

## 2015-02-11 LAB — CBC WITH DIFFERENTIAL/PLATELET
BASOS ABS: 0 10*3/uL (ref 0.0–0.1)
Basophils Relative: 0 %
EOS ABS: 0 10*3/uL (ref 0.0–0.7)
Eosinophils Relative: 7 %
HEMATOCRIT: 24.6 % — AB (ref 36.0–46.0)
HEMOGLOBIN: 7.6 g/dL — AB (ref 12.0–15.0)
LYMPHS PCT: 75 %
Lymphs Abs: 0.1 10*3/uL — ABNORMAL LOW (ref 0.7–4.0)
MCH: 30 pg (ref 26.0–34.0)
MCHC: 30.9 g/dL (ref 30.0–36.0)
MCV: 97.2 fL (ref 78.0–100.0)
MONOS PCT: 4 %
Monocytes Absolute: 0 10*3/uL — ABNORMAL LOW (ref 0.1–1.0)
NEUTROS ABS: 0 10*3/uL — AB (ref 1.7–7.7)
NEUTROS PCT: 14 %
PLATELETS: 96 10*3/uL — AB (ref 150–400)
RBC: 2.53 MIL/uL — AB (ref 3.87–5.11)
RDW: 15 % (ref 11.5–15.5)
WBC: 0.1 10*3/uL — AB (ref 4.0–10.5)

## 2015-02-11 LAB — GLUCOSE, CAPILLARY
GLUCOSE-CAPILLARY: 99 mg/dL (ref 65–99)
GLUCOSE-CAPILLARY: 113 mg/dL — AB (ref 65–99)
Glucose-Capillary: 93 mg/dL (ref 65–99)
Glucose-Capillary: 89 mg/dL (ref 65–99)
Glucose-Capillary: 81 mg/dL (ref 65–99)

## 2015-02-11 LAB — COMPREHENSIVE METABOLIC PANEL
ALBUMIN: 2.1 g/dL — AB (ref 3.5–5.0)
ALT: 26 U/L (ref 14–54)
ANION GAP: 11 (ref 5–15)
AST: 14 U/L — ABNORMAL LOW (ref 15–41)
Alkaline Phosphatase: 76 U/L (ref 38–126)
BILIRUBIN TOTAL: 2.1 mg/dL — AB (ref 0.3–1.2)
BUN: 19 mg/dL (ref 6–20)
CHLORIDE: 100 mmol/L — AB (ref 101–111)
CO2: 30 mmol/L (ref 22–32)
Calcium: 7.5 mg/dL — ABNORMAL LOW (ref 8.9–10.3)
Creatinine, Ser: 0.5 mg/dL (ref 0.44–1.00)
GFR calc Af Amer: >60 mL/min (ref >60)
GFR calc non Af Amer: >60 mL/min (ref >60)
GLUCOSE: 101 mg/dL — AB (ref 65–99)
POTASSIUM: 2.8 mmol/L — AB (ref 3.5–5.1)
SODIUM: 141 mmol/L (ref 135–145)
TOTAL PROTEIN: 5.1 g/dL — AB (ref 6.5–8.1)

## 2015-02-11 LAB — HEPARIN LEVEL (UNFRACTIONATED)
HEPARIN UNFRACTIONATED: 0.35 [IU]/mL (ref 0.30–0.70)
HEPARIN UNFRACTIONATED: 0.23 [IU]/mL — AB (ref 0.30–0.70)
Heparin Unfractionated: 0.19 IU/mL — ABNORMAL LOW (ref 0.30–0.70)

## 2015-02-11 MED ORDER — POTASSIUM CHLORIDE 20 MEQ PO PACK: 40.0000 meq | PACK | ORAL | Status: DC

## 2015-02-11 MED ORDER — HEPARIN (PORCINE) IN NACL 100-0.45 UNIT/ML-% IJ SOLN
1900.0000 [IU]/h | INTRAMUSCULAR | Status: DC
  Administered 2015-02-11: 1900 [IU]/h via INTRAVENOUS
  Filled 2015-02-11 (×3): qty 250

## 2015-02-11 MED ORDER — HEPARIN (PORCINE) IN NACL 100-0.45 UNIT/ML-% IJ SOLN
2050.0000 [IU]/h | INTRAMUSCULAR | Status: DC
Start: 2015-02-11 — End: 2015-02-12
  Administered 2015-02-12: 2050 [IU]/h via INTRAVENOUS
  Filled 2015-02-11 (×2): qty 250

## 2015-02-11 MED ORDER — CHLORHEXIDINE GLUCONATE 0.12 % MT SOLN
OROMUCOSAL | Status: AC
  Administered 2015-02-11: 15 mL via OROMUCOSAL
  Filled 2015-02-11: qty 15

## 2015-02-11 MED ORDER — POTASSIUM CHLORIDE 20 MEQ/15ML (10%) PO SOLN
40.0000 meq | ORAL | Status: AC
  Administered 2015-02-11 (×2): 40 meq
  Filled 2015-02-11 (×2): qty 30

## 2015-02-11 MED ORDER — NYSTATIN 100000 UNIT/GM EX POWD
Freq: Two times a day (BID) | CUTANEOUS | Status: DC
  Administered 2015-02-11 – 2015-02-13 (×6): via TOPICAL
  Administered 2015-02-14: 1 g via TOPICAL
  Filled 2015-02-11: qty 15

## 2015-02-11 NOTE — Progress Notes (Signed)
ANTICOAGULATION CONSULT NOTE - Follow Up Consult  Pharmacy Consult for heparin Indication: DVT  Allergies  Allergen Reactions  . Morphine And Related Hives and Itching  . Norco [Hydrocodone-Acetaminophen] Hives and Itching   Patient Measurements: Height: 5\' 4"  (162.6 cm) Weight: 210 lb 5.1 oz (95.4 kg) IBW/kg (Calculated) : 54.7 Heparin Dosing Weight: 77kg  Vital Signs: Temp: 98.9 F (37.2 C) (11/20 1200) Temp Source: Axillary (11/20 1200) BP: 117/94 mmHg (11/20 1038) Pulse Rate: 146 (11/20 1038)  Labs:  Recent Labs  02/09/15 0508  02/10/15 0623 02/10/15 1704 02/11/15 0418 02/11/15 0630 02/11/15 1212  HGB 9.5*  --  8.6* 8.4*  --  7.6*  --   HCT 30.3*  --  27.2* 26.9*  --  24.6*  --   PLT 193  --  137* 112*  --  96*  --   HEPARINUNFRC 0.48  < >  --  0.14* 0.35  --  0.19*  CREATININE 0.34*  --  0.39*  --   --  0.50  --   < > = values in this interval not displayed. Estimated Creatinine Clearance: 73.3 mL/min (by C-G formula based on Cr of 0.5).  Medical History: Past Medical History  Diagnosis Date  . High cholesterol   . Hypertension   . Acid reflux   . Insomnia   . Scoliosis   . Asthma     "stopped in the 1990's"  . Anemia   . Peptic ulcer   . Daily headache   . Rheumatoid arthritis ([place])     "everywhere" (02/04/2015)  . Chronic lower back pain     "mostly on the right" (02/05/2015)  . Anxiety   . Melanoma ([place]) 11/2011    "left forearm"  . Diffuse large B cell lymphoma (HCC)    Assessment: 46 YOF with large mediastinal mass and pericardial effusion, s/p pericardial window 11/4.  Biopsy revealed B-cell lymphoma s/p 1st cycle of R-CHOP.  She remains on ventilator with trach.  Noted to have swollen LUE 11/16. UE dopplers reveal DVT in internal jugular, subclavian, and proximal axillary veins. Enoxaparin 40mg  given at 10am today. RN noted large bruise on hip, no change with start of Heparin.  Labs, 02/11/2015   Heparin level subtherapeutic again at 0.19  with rate increased to 1650 units/hr.  Hgb decreased to 7.6. Platelets also decreasing, now 96K, in response to chemo.  Time to nadir is 10-14 days which would be the beginning of this week. Discussed with RN if patient is having any signs of bleeding.  RN reported yesterday that there was some brown secretion from NG tube yesterday that tested positive for blood. Elink was contacted and repeat Hgb drawn with instructions to continue to monitor.  No further bleeding since then.   Goal of Therapy:  Heparin level 0.3-0.7 units/ml Monitor platelets by anticoagulation protocol: Yes   Plan:   Increase heparin infusion to 1900 units/hr.  Check heparin level in 8 hours after rate adjusted.  Daily CBC and heparin levels  Continue to monitor for bleeding.  Hershal Coria, PharmD, BCPS Pager: [phone] 02/11/2015 1:17 PM

## 2015-02-11 NOTE — Progress Notes (Signed)
Pt placed on 28% ATC @ 10:30. Tolerated for 3 hours. Notable increase WOB around 1:30. Pt placed back on vent to rest. Sat remained at 98%. RT will continue to monitor.

## 2015-02-11 NOTE — Progress Notes (Signed)
CRITICAL VALUE ALERT  Critical value received:  Positive Blood Cultures Gram Positive Cocci in Clusters  Date of notification:  02/11/2015  Time of notification:  0738  Critical value read back:Yes.    Nurse who received alert:  Gladys Damme, RN  MD notified (1st page):  Dr. Lamonte Sakai, MD  Time of first page:  0800  MD notified (2nd page):  Time of second page:  Responding MD:  Dr. Lamonte Sakai  Time MD responded:  0800

## 2015-02-11 NOTE — Progress Notes (Signed)
PHARMACIST - PHYSICIAN COMMUNICATION CONCERNING:  IV heparin  40 yoF on IV heparin for DVT.  Heparin currently infusing @ 1650 units/hr  Heparin level = 0.35 (Goal 0.3-0.7) = therapeutic No further complications of therapy noted   RECOMMENDATION: Continue IV heparin 1650 units/hr Recheck level in 8 hours to confirm therapeutic dose  Leone Haven, PharmD  02/11/2015, 4:57 AM

## 2015-02-11 NOTE — Progress Notes (Signed)
Name: Maria Bauer MRN: [id number] DOB: [birth year]    ADMISSION DATE:  01/24/2015  CONSULTATION DATE:  02/21/2015  REFERRING MD :  Dr. 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  SIGNIFICANT EVENTS  11/03 Admit 11/04 To OR for pericardial window and mediastinal mass bx >> remains on vent post-op 11/08 Fever, started Abx 11/09 Off pressors 11/10 Oncology consulted 11/11 [name]); transfer to [facility] for chemo (R-CHOP) 11/13  Remains on sedation.  Tolerated pressure support. 11/14  Episode of SVT with suctioning overnight.  Did not tolerated PSV this am due to anxiety. 11/17  Weaned on PSV until 1700 yesterday, vomiting overnight x2.  LUE swollen ([place] pending).  STUDIES:  10/28 CT chest >> 12 cm anterior mediastinal mass, mod pericardial effusion 11/10 Mediastinal mass bx >> diffuse large B-cell lymphoma 11/17 UE Duplex >> neg RUE, acute DVT involving the internal jugular, subclavian and proximal axillary veins   SUBJECTIVE:    Continues to look uncomfortable, often in association with strong and persistent cough Fentanyl gtt ordered 11/19 but not started Empiric abx re-added 11/19 in setting neutropenia and fever.   VITAL SIGNS: BP 103/49 mmHg  Pulse 109  Temp(Src) 99.1 F (37.3 C) (Axillary)  Resp 16  Ht 5\' 4"  (1.626 m)  Wt 95.4 kg (210 lb 5.1 oz)  BMI 36.08 kg/m2  SpO2 100%   VENT SETTINGS: Vent Mode:  [-] PCV FiO2 (%):  [40 %] 40 % Set Rate:  [12 bmp] 12 bmp PEEP:  [5 cmH20] 5 cmH20 Plateau Pressure:  [13 cmH20-19 cmH20] 13 cmH20   INTAKE/OUTPUT: I/O last 3 completed shifts: In: 2851.9 [I.V.:771.9; NG/GT:1180; IV [id number] Out: 1980 [Urine:1180; Emesis/NG output:800]  PHYSICAL EXAMINATION:  General: elderly female in NAD on vent, looks uncomfortable esp when she coughs HENT: trach site with mild erythema, large neck,  PULM:  even/non-labored, diminished lower, otherwise few rhonchi, frequent coughing CV: s1s2 rrr, no m/r/g, ST on monitor, suture site anterior chest c/d/i, second site with sutures intact leaking yellow fluid GI: soft, non tender MSK: generalized edema, LUE swelling  Neuro: sedate, raises eyebrows to voice, generalized weakness  CBC Recent Labs     02/10/15  0623  02/10/15  1704  02/11/15  0630  WBC  0.1*  0.3*  0.1*  HGB  8.6*  8.4*  7.6*  HCT  27.2*  26.9*  24.6*  PLT  137*  112*  96*   Coag's No results for input(s): APTT, INR in the last 72 hours.  BMET Recent Labs     02/09/15  0508  02/10/15  0623  02/11/15  0630  NA  142  142  141  K  3.8  3.6  2.8*  CL  99*  99*  100*  CO2  35*  33*  30  BUN  27*  21*  19  CREATININE  0.34*  0.39*  0.50  GLUCOSE  151*  103*  101*    Electrolytes Recent Labs     02/09/15  0508  02/10/15  0623  02/11/15  0630  CALCIUM  8.6*  8.0*  7.5*  PHOS  3.4   --    --    Sepsis Markers Recent Labs     02/09/15  0508  02/10/15  0623  PROCALCITON  0.39  1.34    ABG No results for input(s): PHART, PCO2ART, PO2ART in the  last 72 hours.   Liver Enzymes Recent Labs     02/08/15  1445  02/10/15  0623  02/11/15  0630  AST  28  16  14*  ALT  57*  33  26  ALKPHOS  85  85  76  BILITOT  2.0*  2.4*  2.1*  ALBUMIN  2.1*  2.3*  2.1*   Glucose Recent Labs     02/10/15  1136  02/10/15  1551  02/10/15  2056  02/10/15  2157  02/11/15  0019  02/11/15  0407  GLUCAP  88  83  70  120*  93  89    Imaging Dg Chest Port 1 View  02/10/2015  CLINICAL DATA:  Acute respiratory failure.  Subsequent encounter. EXAM: PORTABLE CHEST 1 VIEW COMPARISON:  02/08/2015 FINDINGS: Linear and hazy airspace lung opacity projects from the left hilum to the left mid and upper lung and left lung base, similar to the previous day's study. There is diffuse interstitial thickening bilaterally, also stable. No pneumothorax. Tracheostomy tube, right internal  jugular central venous line and orogastric tube are well positioned, without significant change. IMPRESSION: 1. No significant change from the prior study. 2. Persistent opacity in the left lung, which may be due to atelectasis or pneumonia. Persistent bilateral interstitial thickening is stable. No new lung abnormalities. 3. Support apparatus is stable and well positioned. Electronically Signed   By: Lajean Manes M.D.   On: 02/10/2015 07:25    ASSESSMENT / PLAN:  PULMONARY Trach 11/11 >> A: Acute respiratory failure. Failure to wean from vent s/p trach. Thus far has not tolerated PSV, significant contributor is cough leading to asynchrony and vent alarms Concern for aspiration - vomiting overnight 11/17, ? TF suctioned from trach  Persistent cough P: Pressure support wean, to trach collar as tolerated  Pressure control as rest mode  Attempt to get her upright, avoid trach contact with tracheal wall; continued cough appears to be source of much of her discomfort F/u CXR intermittently Hold off further lasix 11/120  CARDIOVASCULAR: Rt IJ CVL 11/04 >> A: Malignant pericardial effusion s/p window. Hypotension likely from hypovolemia and sedation >> improved but still labile w sedating meds Hx of HLD, HTN. Sinus tachycardia P: Continue outpatient lopressor, dose decreased 11/19.  Continue zocor  Monitor hemodynamics Follow BP trend closely given cessation of steroids (pulse steroids, monitor for AI) Monitor incision sites / drainage  RENAL A:  Hypokalemia. Hypernatremia. P: Replace electrolytes as indicated Continue free water, 250 Q6 D5 1/2NS @ 10 ml/hr Hold lasix and follow I/O  GASTROENTEROLOGY:  A:  Nutrition. Gastroparesis. Mild Ileus - noted on KUB 11/17 Nausea / Vomiting - new, 11/17. Coffee grounds from NGT 11/19 P:  Tube feeding per Nutrition  Monitor abdominal exam Zantac for SUP Reglan 5mg  IV Q8 x 4 doses completed  PRN zofran for nausea / vomiting    HEMATOLOGY/ONCOLOGY: A: B cell lymphoma. Anemia of critical illness. LUE Swelling - positive for DVT 11/17 in L IJ, L [place], L axillary veins Neutropenia - new 11/18 Thrombocytopenia  P: Chemotherapy (R-CHOP) >> completed 1st cycle 11/11, Rituxan 11/14 Previously used solu-medrol in place of prednisone, completed ONC notes reflect high dose steroid need completed 11/15.   Allopurinol for tumor lysis syndrome prophylaxis F/u CBC Heparin gtt for DVT   ID:  A: Tracheobronchitis. Neutropenic Fever P: Treated w ceftriaxone thru 11/16  Blood 11/09 >> neg Sputum 11/09 >> Moderate Streptococcus, beta hemolytic Blood 11/18 >> GPC clusters >>  Vanco 11/19 >>  Zosyn 11/19 >>   Started empiric abx on 11/19 given febrile neutropenia Nystatin powder to perineal area  ENDOCRINE: A: Steroid induced hyperglycemia. P: SSI  Levemir 25 units   NEUROLOGY:  A:  Agitation. Anxiety P: RASS goal 0 PRN fentanyl for pain  Fentanyl patch added 11/18 by oncology PRN ativan for agitation  Agitation / pain has been difficult to manage due to hypotension from meds. Will attempt to balance. Ordered fentanyl gtt on 11/19 but unable to tolerate due to hypotension    RHEUMATOLOGY: A: Hx of RA >> was on MTX as outpt. P: Will need to re-assess as outpt   Independent CC time 35 minutes   Baltazar Apo, MD, PhD 02/11/2015, 7:57 AM [facility] [phone] or if no answer [phone]

## 2015-02-11 NOTE — Progress Notes (Signed)
PHARMACY - HEPARIN (brief note)  Patient with DVT and heparin infusing @ 1900 units/hr Heparin level = 0.23 (subtherapeutic) No further bleeding complications noted  Plan:  Increase heparin to 2050 units/hr           Check heparin level 8 hr after rate increase  Leone Haven, PharmD 02/11/15 @ 22:38

## 2015-02-12 ENCOUNTER — Inpatient Hospital Stay (HOSPITAL_COMMUNITY): Payer: Medicare (Managed Care)

## 2015-02-12 DIAGNOSIS — D649 Anemia, unspecified: Secondary | ICD-10-CM

## 2015-02-12 DIAGNOSIS — R5081 Fever presenting with conditions classified elsewhere: Secondary | ICD-10-CM

## 2015-02-12 DIAGNOSIS — J9620 Acute and chronic respiratory failure, unspecified whether with hypoxia or hypercapnia: Secondary | ICD-10-CM

## 2015-02-12 DIAGNOSIS — Z930 Tracheostomy status: Secondary | ICD-10-CM

## 2015-02-12 DIAGNOSIS — D61810 Antineoplastic chemotherapy induced pancytopenia: Secondary | ICD-10-CM

## 2015-02-12 DIAGNOSIS — D709 Neutropenia, unspecified: Secondary | ICD-10-CM

## 2015-02-12 LAB — CULTURE, BLOOD (ROUTINE X 2)

## 2015-02-12 LAB — COMPREHENSIVE METABOLIC PANEL
ALBUMIN: 1.8 g/dL — AB (ref 3.5–5.0)
ALT: 21 U/L (ref 14–54)
AST: 14 U/L — AB (ref 15–41)
Alkaline Phosphatase: 81 U/L (ref 38–126)
Anion gap: 11 (ref 5–15)
BUN: 11 mg/dL (ref 6–20)
CHLORIDE: 104 mmol/L (ref 101–111)
CO2: 28 mmol/L (ref 22–32)
CREATININE: 0.36 mg/dL — AB (ref 0.44–1.00)
Calcium: 7.5 mg/dL — ABNORMAL LOW (ref 8.9–10.3)
GFR calc Af Amer: >60 mL/min (ref >60)
GFR calc non Af Amer: >60 mL/min (ref >60)
GLUCOSE: 91 mg/dL (ref 65–99)
Potassium: 3.3 mmol/L — ABNORMAL LOW (ref 3.5–5.1)
SODIUM: 143 mmol/L (ref 135–145)
Total Bilirubin: 1.9 mg/dL — ABNORMAL HIGH (ref 0.3–1.2)
Total Protein: 4.9 g/dL — ABNORMAL LOW (ref 6.5–8.1)

## 2015-02-12 LAB — CBC WITH DIFFERENTIAL/PLATELET
BASOS ABS: 0 10*3/uL (ref 0.0–0.1)
Basophils Relative: 6 %
EOS ABS: 0 10*3/uL (ref 0.0–0.7)
Eosinophils Relative: 0 %
HCT: 22.9 % — ABNORMAL LOW (ref 36.0–46.0)
HEMOGLOBIN: 7.1 g/dL — AB (ref 12.0–15.0)
LYMPHS PCT: 33 %
Lymphs Abs: 0.1 10*3/uL — ABNORMAL LOW (ref 0.7–4.0)
MCH: 29.8 pg (ref 26.0–34.0)
MCHC: 31 g/dL (ref 30.0–36.0)
MCV: 96.2 fL (ref 78.0–100.0)
MONO ABS: 0.1 10*3/uL (ref 0.1–1.0)
MONOS PCT: 39 %
NEUTROS PCT: 22 %
Neutro Abs: 0 10*3/uL — ABNORMAL LOW (ref 1.7–7.7)
PLATELETS: 124 10*3/uL — AB (ref 150–400)
RBC: 2.38 MIL/uL — ABNORMAL LOW (ref 3.87–5.11)
RDW: 15.1 % (ref 11.5–15.5)
WBC: 0.2 10*3/uL — CL (ref 4.0–10.5)
nRBC: 42 /100 WBC — ABNORMAL HIGH

## 2015-02-12 LAB — PREPARE RBC (CROSSMATCH)

## 2015-02-12 LAB — GLUCOSE, CAPILLARY
GLUCOSE-CAPILLARY: 91 mg/dL (ref 65–99)
GLUCOSE-CAPILLARY: 96 mg/dL (ref 65–99)
GLUCOSE-CAPILLARY: 98 mg/dL (ref 65–99)
Glucose-Capillary: 99 mg/dL (ref 65–99)
Glucose-Capillary: 81 mg/dL (ref 65–99)
Glucose-Capillary: 91 mg/dL (ref 65–99)
Glucose-Capillary: 115 mg/dL — ABNORMAL HIGH (ref 65–99)

## 2015-02-12 LAB — HEPARIN LEVEL (UNFRACTIONATED)
HEPARIN UNFRACTIONATED: 0.18 [IU]/mL — AB (ref 0.30–0.70)
Heparin Unfractionated: 0.62 IU/mL (ref 0.30–0.70)

## 2015-02-12 LAB — ABO/RH: ABO/RH(D): A POS

## 2015-02-12 LAB — VANCOMYCIN, TROUGH: Vancomycin Tr: 37 ug/mL (ref 10.0–20.0)

## 2015-02-12 MED ORDER — CLONAZEPAM 0.5 MG PO TABS
0.5000 mg | ORAL_TABLET | Freq: Two times a day (BID) | ORAL | Status: DC
  Administered 2015-02-12 – 2015-02-14 (×4): 0.5 mg via ORAL
  Filled 2015-02-12 (×4): qty 1

## 2015-02-12 MED ORDER — HEPARIN SOD (PORK) LOCK FLUSH 100 UNIT/ML IV SOLN
500.0000 [IU] | Freq: Every day | INTRAVENOUS | Status: DC | PRN
  Filled 2015-02-12: qty 5

## 2015-02-12 MED ORDER — VITAL HIGH PROTEIN PO LIQD
1000.0000 mL | ORAL | Status: DC
  Filled 2015-02-12 (×2): qty 1000

## 2015-02-12 MED ORDER — HEPARIN SOD (PORK) LOCK FLUSH 100 UNIT/ML IV SOLN
250.0000 [IU] | INTRAVENOUS | Status: DC | PRN
  Filled 2015-02-12: qty 3

## 2015-02-12 MED ORDER — VITAL HIGH PROTEIN PO LIQD
1000.0000 mL | ORAL | Status: AC
  Administered 2015-02-12: 1000 mL
  Filled 2015-02-12: qty 1000

## 2015-02-12 MED ORDER — DIPHENHYDRAMINE HCL 50 MG/ML IJ SOLN: 25.0000 mg | Freq: Once | INTRAMUSCULAR | Status: DC | PRN

## 2015-02-12 MED ORDER — POTASSIUM CHLORIDE 20 MEQ/15ML (10%) PO SOLN
40.0000 meq | Freq: Once | ORAL | Status: AC
  Administered 2015-02-12: 40 meq
  Filled 2015-02-12: qty 30

## 2015-02-12 MED ORDER — SODIUM CHLORIDE 0.9 % IV SOLN: 250.0000 mL | Freq: Once | INTRAVENOUS | Status: DC

## 2015-02-12 MED ORDER — SODIUM CHLORIDE 0.9 % IJ SOLN: 3.0000 mL | INTRAMUSCULAR | Status: DC | PRN

## 2015-02-12 MED ORDER — SODIUM CHLORIDE 0.9 % IJ SOLN: 10.0000 mL | INTRAMUSCULAR | Status: DC | PRN

## 2015-02-12 MED ORDER — HEPARIN (PORCINE) IN NACL 100-0.45 UNIT/ML-% IJ SOLN
2400.0000 [IU]/h | INTRAMUSCULAR | Status: DC
  Administered 2015-02-12 (×2): 2400 [IU]/h via INTRAVENOUS
  Filled 2015-02-12 (×4): qty 250

## 2015-02-12 NOTE — Progress Notes (Signed)
Nutrition Follow-up  DOCUMENTATION CODES:   Obesity unspecified  INTERVENTION:   When tube feeding is resumed: Initiate Vital HP @ 20 ml/hr and increase by 10 ml every 4 hours to goal rate of 55 ml/hr.  Continue free water flushes of 250 ml Q6hr  Tube feeding regimen provides 1320 kcal (76% of needs), 116 grams of protein, and 2104 ml of H2O.   RD to continue to monitor  NUTRITION DIAGNOSIS:   Inadequate oral intake related to inability to eat as evidenced by NPO status.  Ongoing.  GOAL:   Provide needs based on ASPEN/SCCM guidelines  Not meeting.  MONITOR:   Vent status, Labs, Weight trends, TF tolerance, Skin, I & O's    ASSESSMENT:   69 yo Female with PMH of scoliosis, HTN, anxiety; admitted with pericardial effusion, mediastinal mass. 11/11: Transferred to [facility] from [facility] for chemotherapy 11/17: Pt with N/V, TF stopped  TF will resume today per MD note. Recommendations provided above.  Patient is currently intubated on ventilator support MV: 10.1 L/min Temp (24hrs), Avg:99.2 F (37.3 C), Min:98.6 F (37 C), Max:100.4 F (38 C)  Propofol: none  Labs reviewed: Low K, Creatinine  Diet Order:     Skin:  Reviewed, no issues  Last BM:  11/20  Height:   Ht Readings from Last 1 Encounters:  02/13/2015 5\' 4"  (1.626 m)    Weight:   Wt Readings from Last 1 Encounters:  02/10/15 210 lb 5.1 oz (95.4 kg)    Ideal Body Weight:  54.5 kg  BMI:  Body mass index is 36.08 kg/(m^2).  Estimated Nutritional Needs:   Kcal:  [id number]  Protein:  105-115g  Fluid:  1.6L/day  EDUCATION NEEDS:   No education needs identified at this time  Clayton Bibles, MS, RD, LDN Pager: [phone] After Hours Pager: [phone]

## 2015-02-12 NOTE — Progress Notes (Signed)
Date: February 12, 2015 Chart reviewed for concurrent status and case management needs. Will continue to follow patient for changes and needs: Velva Harman, RN, BSN, [place]   [phone]

## 2015-02-12 NOTE — Progress Notes (Signed)
ANTICOAGULATION CONSULT NOTE - Follow Up Consult  Pharmacy Consult for heparin Indication: DVT  Allergies  Allergen Reactions  . Morphine And Related Hives and Itching  . Norco [Hydrocodone-Acetaminophen] Hives and Itching   Patient Measurements: Height: 5\' 4"  (162.6 cm) Weight: 210 lb 5.1 oz (95.4 kg) IBW/kg (Calculated) : 54.7 Heparin Dosing Weight: 77kg  Vital Signs: Temp: 99.1 F (37.3 C) (11/21 0800) Temp Source: Oral (11/21 0800) BP: 134/54 mmHg (11/21 1019) Pulse Rate: 138 (11/21 1019)  Labs:  Recent Labs  02/10/15 0623 02/10/15 1704  02/11/15 0630 02/11/15 1212 02/11/15 2130 02/12/15 0500 02/12/15 1125  HGB 8.6* 8.4*  --  7.6*  --   --  7.1*  --   HCT 27.2* 26.9*  --  24.6*  --   --  22.9*  --   PLT 137* 112*  --  96*  --   --  124*  --   HEPARINUNFRC  --  0.14*  < >  --  0.19* 0.23*  --  0.18*  CREATININE 0.39*  --   --  0.50  --   --  0.36*  --   < > = values in this interval not displayed. Estimated Creatinine Clearance: 73.3 mL/min (by C-G formula based on Cr of 0.36).  Medical History: Past Medical History  Diagnosis Date  . High cholesterol   . Hypertension   . Acid reflux   . Insomnia   . Scoliosis   . Asthma     "stopped in the 1990's"  . Anemia   . Peptic ulcer   . Daily headache   . Rheumatoid arthritis ([place])     "everywhere" (02/20/2015)  . Chronic lower back pain     "mostly on the right" (01/28/2015)  . Anxiety   . Melanoma ([place]) 11/2011    "left forearm"  . 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  Biopsy revealed B-cell lymphoma s/p 1st cycle of R-CHOP.  She remains on ventilator with trach.  Noted to have swollen LUE 11/16. UE dopplers reveal DVT in internal jugular, subclavian, and proximal axillary veins. Enoxaparin 40mg  given at 10am today. RN noted large bruise on hip, no change with start of Heparin.  Today, 02/12/2015:   Heparin level remains subtherapeutic despite rate increase last night to 2050 units/hr. Reviewed with RN - rate correct at 20.5 mL/hr, no interruptions in the infusion noted. Hgb falling consistent with myelosuppressive effects of recent chemotherapy. Pltc low but improving. No overt bleeding reported.   Goal of Therapy:  Heparin level 0.3-0.7 units/ml Monitor platelets by anticoagulation protocol: Yes   Plan:  Increase heparin rate to 2400 units/hr Recheck heparin level at 7:30 pm Follow heparin level, H/H, and platelet count daily Monitor for evidence of bleeding Follow clinical course.  Clayburn Pert, PharmD, BCPS Pager: [phone] 02/12/2015  1:08 PM

## 2015-02-12 NOTE — Progress Notes (Signed)
PHARMACY - HEPARIN (brief note)  Patient with DVT and heparin infusing @ 2400 units/hr Heparin level = 0.62 (therapeutic) No further bleeding complications noted  Plan:  continue heparin at 2400 units/hr           Check heparin level with morning labs           Follow  H/H, and platelet count daily           Monitor for evidence of bleeding           Follow clinical course.  Dolly Rias RPh 02/12/2015, 8:39 PM Pager [phone]

## 2015-02-12 NOTE — Progress Notes (Signed)
Pharmacy Antibiotic Time-Out Note  Maria Bauer is a 69 y.o. year-old female admitted on 01/30/2015.  The patient is currently on  for Zosyn 3.375 grams IV q8h (by extended infusion) and vancomycin 1250 mg IV q12h for neutropenic fever.  Assessment: Remains profoundly neutropenic (ANC=0). Blood culture from 11/18 growing coag-negative Staph in 1 of 2 sets (significance uncertain). SCr low, stable. Vancomycin trough today was elevated, but this is an artifact due to delay in collecting the sample.  Plan: Repeat vancomycin trough 0900 11/22. Follow ANC, renal function, clinical course. Review planned DOT.   Temp (24hrs), Avg:99.2 F (37.3 C), Min:98.6 F (37 C), Max:100.4 F (38 C)   Recent Labs Lab 02/09/15 0508 02/10/15 0623 02/10/15 1704 02/11/15 0630 02/12/15 0500  WBC 0.5* 0.1* 0.3* 0.1* 0.2*    Recent Labs Lab 02/08/15 0505 02/09/15 0508 02/10/15 0623 02/11/15 0630 02/12/15 0500  CREATININE <0.30* 0.34* 0.39* 0.50 0.36*   Estimated Creatinine Clearance: 73.3 mL/min (by C-G formula based on Cr of 0.36).   Antimicrobial allergies: none  Antimicrobials this admission: 1/4 >> Cefuroxime (post-op abx) >> 11/8 11/9 >> Ceftazidime >> 11/12 11/12 >> Rocephin >>11/16 11/19 >> Vanc >> 11/19 >> Zosyn >>  Levels/dose changes this admission: 11/21: vanc level 37, but was drawn after dose instead of before  Microbiology Results: 11/3 MRSA PCR: negative 11/5 Trach asp: NGF 11/7 Cdiff: Ag negative, Toxin negative 11/8 Blood x2: NGF 11/9 Trach asp: moderate B-hem strep (not group A) - F 11/18 blood: 1/2 coag-negative Staph   Thank you for allowing pharmacy to be a part of this patient's care.  Joycelyn Rua PharmD, BCPS 02/12/2015 12:49 PM

## 2015-02-12 NOTE — Progress Notes (Signed)
Name: Maria Bauer MRN: [id number] DOB: Jul 26, 1945    ADMISSION DATE:  02/03/2015  CONSULTATION DATE:  02/16/2015  REFERRING MD :  Dr. 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  SIGNIFICANT EVENTS  10/28 CT chest >> 12 cm anterior mediastinal mass, mod pericardial effusion 11/03 Admit 11/04 To OR for pericardial window and mediastinal mass bx >> remains on vent post-op 11/08 Fever, started Abx 11/09 Off pressors 11/10 Oncology consulted n-11/10 Mediastinal mass bx >> diffuse large B-cell lymphoma 11/11 Trach (JY); transfer to [facility] for chemo (R-CHOP) 11/13  Remains on sedation.  Tolerated pressure support. 11/14  Episode of SVT with suctioning overnight.  Did not tolerated PSV this am due to anxiety. 11/17  Weaned on PSV until 1700 yesterday, vomiting overnight x2.  LUE swollen ([place] pending) 11/17 UE Duplex >> neg RUE, acute DVT involving the internal jugular, subclavian and proximal axillary veins 11/19  Empiric abx restarted in setting of fever with neutropenia 11/20  Weaned 3 hours on ATC   SUBJECTIVE:    02/12/15 - Significant vent dysnchrony +. Hypoglycemia + and APP plans to start tube feeds.   VITAL SIGNS: BP 124/53 mmHg  Pulse 133  Temp(Src) 99.1 F (37.3 C) (Oral)  Resp 29  Ht 5\' 4"  (1.626 m)  Wt 210 lb 5.1 oz (95.4 kg)  BMI 36.08 kg/m2  SpO2 94%   VENT SETTINGS: Vent Mode:  [-] PCV FiO2 (%):  [28 %-40 %] 30 % Set Rate:  [12 bmp] 12 bmp PEEP:  [5 cmH20] 5 cmH20 Plateau Pressure:  [11 cmH20-20 cmH20] 14 cmH20   INTAKE/OUTPUT: I/O last 3 completed shifts: In: 2255.1 [I.V.:805.1; NG/GT:500; IV Piggyback:950] Out: [id number] [Urine:1315]  PHYSICAL EXAMINATION:  General: elderly female in NAD on vent, looks uncomfortable esp when she coughs HENT: trach site with mild erythema, large neck, difficult to assess JVD PULM: even/non-labored,  diminished lower, otherwise few rhonchi, frequent coughing CV: s1s2 rrr, no m/r/g, ST on monitor, suture site anterior chest c/d/i, second site with sutures intact leaking yellow fluid GI: soft, non tender MSK: generalized edema, LUE swelling  Neuro: generalized weakness, communicates appropriately with questioning  CBC Recent Labs     02/10/15  1704  02/11/15  0630  02/12/15  0500  WBC  0.3*  0.1*  0.2*  HGB  8.4*  7.6*  7.1*  HCT  26.9*  24.6*  22.9*  PLT  112*  96*  124*   Coag's No results for input(s): APTT, INR in the last 72 hours.  BMET Recent Labs     02/10/15  0623  02/11/15  0630  02/12/15  0500  NA  142  141  143  K  3.6  2.8*  3.3*  CL  99*  100*  104  CO2  33*  30  28  BUN  21*  19  11  CREATININE  0.39*  0.50  0.36*  GLUCOSE  103*  101*  91    Electrolytes Recent Labs     02/10/15  0623  02/11/15  0630  02/12/15  0500  CALCIUM  8.0*  7.5*  7.5*   Sepsis Markers Recent Labs     02/10/15  0623  PROCALCITON  1.34    ABG No results for input(s): PHART, PCO2ART, PO2ART in the last 72 hours.   Liver Enzymes Recent Labs     02/10/15  [id number]  02/11/15  0630  02/12/15  0500  AST  16  14*  14*  ALT  33  26  21  ALKPHOS  85  76  81  BILITOT  2.4*  2.1*  1.9*  ALBUMIN  2.3*  2.1*  1.8*   Glucose Recent Labs     02/11/15  1200  02/11/15  1549  02/11/15  2008  02/12/15  0022  02/12/15  0334  02/12/15  0732  GLUCAP  99  113*  99  91  81  91    Imaging Dg Chest Port 1 View  02/12/2015  CLINICAL DATA:  Acute respiratory failure EXAM: PORTABLE CHEST 1 VIEW COMPARISON:  02/11/2015 FINDINGS: Tracheostomy tube is well seated. Right IJ central line, tip at the SVC level. Orogastric tube tip at the upper stomach. A bandlike opacity in the left perihilar lung has a central lucent area which has variable appearance, likely aerated lung with neighboring atelectasis rather than a cavity. Mediastinal widening correlating with known mass. No gross  change in cardiopericardial size, distorted by leftward rotation, in this patient with recent sub xiphoid window. Interstitial coarsening compatible with pulmonary venous congestion, stable. No pneumothorax. IMPRESSION: 1. Unchanged positioning of tubes and central line. 2. Stable pulmonary venous congestion and left perihilar atelectasis. Electronically Signed   By: Monte Fantasia M.D.   On: 02/12/2015 07:10   Dg Chest Port 1 View  02/11/2015  CLINICAL DATA:  Acute respiratory failure EXAM: PORTABLE CHEST 1 VIEW COMPARISON:  02/10/2015 FINDINGS: Tracheostomy in good position. NG tube in the proximal stomach and unchanged Progression of left lower lobe airspace disease. Elevated left hemidiaphragm again noted. Possible small left pleural effusion. Right lung remains clear IMPRESSION: Progression of left lower lobe atelectasis/ infiltrate with elevated left hemidiaphragm. Electronically Signed   By: Franchot Gallo M.D.   On: 02/11/2015 09:14   cxr - personally visualized  ASSESSMENT / PLAN:  PULMONARY Trach 11/11 >> A: Acute respiratory failure. Failure to wean from vent s/p trach. Thus far has not tolerated PSV, significant contributor is cough leading to asynchrony and vent alarms Concern for aspiration - vomiting overnight 11/17, ? TF suctioned from trach  Persistent cough P: Pressure support wean, to trach collar as tolerated  Pressure control as rest mode  Attempt to get her upright, avoid trach contact with tracheal wall; continued cough appears to be source of much of her discomfort F/u CXR intermittently   CARDIOVASCULAR: Rt IJ CVL 11/04 >> A: Malignant pericardial effusion s/p window. Hypotension likely from hypovolemia and sedation >> improved but still labile w sedating meds Hx of HLD, HTN. Sinus tachycardia P: Continue outpatient lopressor, dose decreased 11/19.  Continue zocor  Monitor hemodynamics Follow BP trend closely given cessation of steroids (pulse steroids,  monitor for AI) Monitor incision sites / drainage  RENAL A:  Hypokalemia. Hypernatremia. P: Replace electrolytes as indicated Continue free water, 250 Q6 D5 1/2NS @ 10 ml/hr Follow I/O   GASTROENTEROLOGY:  A:  Nutrition. Gastroparesis. Mild Ileus - noted on KUB 11/17 Nausea / Vomiting - new, 11/17. Coffee grounds from NGT 11/19 P:  TF on hold, consider restart am 11/21 if KUB stable to improved Monitor abdominal exam Zantac for SUP PRN zofran for nausea / vomiting   HEMATOLOGY/ONCOLOGY: A: B cell lymphoma. Anemia of critical illness. LUE Swelling - positive for DVT 11/17 in L IJ, L [place], L axillary veins Neutropenia - new 11/18 Thrombocytopenia  P: Chemotherapy (R-CHOP) >> completed 1st cycle 11/11,  Rituxan 11/14 Previously used solu-medrol in place of prednisone, completed ONC notes reflect high dose steroid need completed 11/15.   Allopurinol for tumor lysis syndrome prophylaxis F/u CBC Heparin gtt for DVT  Monitor Hgb drift - has decreased from 9 to 7.1 - transfuse for hgb < 7gm% only  ID:  A: Tracheobronchitis. Neutropenic Fever P: Treated w ceftriaxone thru 11/16  Blood 11/09 >> neg Sputum 11/09 >> Moderate Streptococcus, beta hemolytic Blood 11/18 >> GPC clusters >>  C-Diff 11/19 >> neg  Vanco 11/19 >>  Zosyn 11/19 >>   Started empiric abx on 11/19 given febrile neutropenia Nystatin powder to perineal area  ENDOCRINE: A: Steroid induced hyperglycemia. P: SSI  Hold Levemir, was previously on 25 units with TF running  NEUROLOGY:  A:  Agitation. Anxiety P: RASS goal 0 PRN fentanyl for pain  Fentanyl patch added 11/18 by oncology Seroquel 25 mg BID Klonopin 0.5 mg BID added 11/21  PRN ativan for agitation  Agitation / pain has been difficult to manage due to hypotension from meds. Will attempt to balance. Ordered fentanyl gtt on 11/19 but unable to tolerate due to hypotension    RHEUMATOLOGY: A: Hx of RA >> was on MTX as  outpt. P: Will need to re-assess as outpt     [place]   STAFFED by - in [place] by Dr Valora Corporal  Dr. Brand Males, M.D., [place].C.P Pulmonary and Critical Care Medicine Staff Physician [facility] Pager: [phone], If no answer or between  15:00h - 7:00h: call [phone]  02/12/2015 10:18 AM

## 2015-02-12 NOTE — Progress Notes (Signed)
[facility] Progress Note Patient Name: Maria Bauer DOB: 10-18-1945 MRN: [id number]   Date of Service  02/12/2015  HPI/Events of Note    eICU Interventions  TFs started per nutrition recs     Intervention Category Evaluation Type: Other  Damyan Corne S. 02/12/2015, 5:44 PM

## 2015-02-13 ENCOUNTER — Inpatient Hospital Stay (HOSPITAL_COMMUNITY): Payer: Medicare (Managed Care)

## 2015-02-13 DIAGNOSIS — D701 Agranulocytosis secondary to cancer chemotherapy: Secondary | ICD-10-CM

## 2015-02-13 DIAGNOSIS — D702 Other drug-induced agranulocytosis: Secondary | ICD-10-CM | POA: Insufficient documentation

## 2015-02-13 DIAGNOSIS — N179 Acute kidney failure, unspecified: Secondary | ICD-10-CM

## 2015-02-13 DIAGNOSIS — D649 Anemia, unspecified: Secondary | ICD-10-CM | POA: Insufficient documentation

## 2015-02-13 DIAGNOSIS — T451X5A Adverse effect of antineoplastic and immunosuppressive drugs, initial encounter: Secondary | ICD-10-CM

## 2015-02-13 LAB — COMPREHENSIVE METABOLIC PANEL
ALT: 19 U/L (ref 14–54)
ANION GAP: 11 (ref 5–15)
AST: 17 U/L (ref 15–41)
Albumin: 2.1 g/dL — ABNORMAL LOW (ref 3.5–5.0)
Alkaline Phosphatase: 192 U/L — ABNORMAL HIGH (ref 38–126)
BILIRUBIN TOTAL: 2.2 mg/dL — AB (ref 0.3–1.2)
BUN: 18 mg/dL (ref 6–20)
CHLORIDE: 104 mmol/L (ref 101–111)
CO2: 27 mmol/L (ref 22–32)
Calcium: 7.8 mg/dL — ABNORMAL LOW (ref 8.9–10.3)
Creatinine, Ser: 1.13 mg/dL — ABNORMAL HIGH (ref 0.44–1.00)
GFR, EST AFRICAN AMERICAN: 56 mL/min — AB (ref >60)
GFR, EST NON AFRICAN AMERICAN: 48 mL/min — AB (ref >60)
Glucose, Bld: 133 mg/dL — ABNORMAL HIGH (ref 65–99)
POTASSIUM: 3.5 mmol/L (ref 3.5–5.1)
Sodium: 142 mmol/L (ref 135–145)
TOTAL PROTEIN: 5.1 g/dL — AB (ref 6.5–8.1)

## 2015-02-13 LAB — GLUCOSE, CAPILLARY
GLUCOSE-CAPILLARY: 123 mg/dL — AB (ref 65–99)
GLUCOSE-CAPILLARY: 109 mg/dL — AB (ref 65–99)
GLUCOSE-CAPILLARY: 110 mg/dL — AB (ref 65–99)
GLUCOSE-CAPILLARY: 106 mg/dL — AB (ref 65–99)
Glucose-Capillary: 116 mg/dL — ABNORMAL HIGH (ref 65–99)
Glucose-Capillary: 124 mg/dL — ABNORMAL HIGH (ref 65–99)

## 2015-02-13 LAB — CBC WITH DIFFERENTIAL/PLATELET
BASOS PCT: 1 %
Basophils Absolute: 0 10*3/uL (ref 0.0–0.1)
EOS ABS: 0 10*3/uL (ref 0.0–0.7)
EOS PCT: 0 %
HEMATOCRIT: 32.5 % — AB (ref 36.0–46.0)
Hemoglobin: 10.6 g/dL — ABNORMAL LOW (ref 12.0–15.0)
Lymphocytes Relative: 12 %
Lymphs Abs: 0.4 10*3/uL — ABNORMAL LOW (ref 0.7–4.0)
MCH: 29.9 pg (ref 26.0–34.0)
MCHC: 32.6 g/dL (ref 30.0–36.0)
MCV: 91.8 fL (ref 78.0–100.0)
MONO ABS: 0.3 10*3/uL (ref 0.1–1.0)
MONOS PCT: 8 %
NEUTROS PCT: 79 %
Neutro Abs: 2.7 10*3/uL (ref 1.7–7.7)
PLATELETS: 151 10*3/uL (ref 150–400)
RBC: 3.54 MIL/uL — ABNORMAL LOW (ref 3.87–5.11)
RDW: 16.1 % — AB (ref 11.5–15.5)
WBC: 3.4 10*3/uL — ABNORMAL LOW (ref 4.0–10.5)

## 2015-02-13 LAB — VANCOMYCIN, TROUGH: Vancomycin Tr: 33 ug/mL (ref 10.0–20.0)

## 2015-02-13 LAB — HEPARIN LEVEL (UNFRACTIONATED)
HEPARIN UNFRACTIONATED: 0.47 [IU]/mL (ref 0.30–0.70)
Heparin Unfractionated: 0.78 IU/mL — ABNORMAL HIGH (ref 0.30–0.70)

## 2015-02-13 LAB — TISSUE HYBRIDIZATION TO NCBH

## 2015-02-13 LAB — LACTATE DEHYDROGENASE: LDH: 305 U/L — ABNORMAL HIGH (ref 98–192)

## 2015-02-13 MED ORDER — HEPARIN (PORCINE) IN NACL 100-0.45 UNIT/ML-% IJ SOLN
2200.0000 [IU]/h | INTRAMUSCULAR | Status: DC
  Administered 2015-02-13 – 2015-02-14 (×2): 2200 [IU]/h via INTRAVENOUS
  Filled 2015-02-13 (×4): qty 250

## 2015-02-13 MED ORDER — HALOPERIDOL LACTATE 5 MG/ML IJ SOLN
2.5000 mg | Freq: Once | INTRAMUSCULAR | Status: AC
  Administered 2015-02-13: 2.5 mg via INTRAVENOUS
  Filled 2015-02-13: qty 1

## 2015-02-13 MED ORDER — POTASSIUM CHLORIDE 20 MEQ/15ML (10%) PO SOLN
40.0000 meq | Freq: Two times a day (BID) | ORAL | Status: AC
  Administered 2015-02-13 (×2): 40 meq
  Filled 2015-02-13 (×2): qty 30

## 2015-02-13 MED ORDER — HALOPERIDOL LACTATE 5 MG/ML IJ SOLN
5.0000 mg | Freq: Four times a day (QID) | INTRAMUSCULAR | Status: AC
  Administered 2015-02-13 – 2015-02-14 (×2): 5 mg via INTRAVENOUS
  Filled 2015-02-13 (×3): qty 1

## 2015-02-13 NOTE — Progress Notes (Signed)
[facility]   HEMATOLOGY/ONCOLOGY INPATIENT PROGRESS NOTE  Date of Service: 02/13/2015 Inpatient Attending: .Brand Males, MD   SUBJECTIVE  Maria Bauer was seen this morning. She is resting comfortably in bed. Her hemoglobin level looked much better. Her neutropenia has resolved. LDH level continues to improve and is now down to 300. On IV heparin for LUE with no overt evidence of bleeding.   OBJECTIVE:  PHYSICAL EXAMINATION: . Filed Vitals:   02/13/15 1000 02/13/15 1100 02/13/15 1200 02/13/15 1300  BP: 119/54 90/42 103/42 118/59  Pulse: 99 81 79 85  Temp:   99.2 F (37.3 C)   TempSrc:   Axillary   Resp: 16 15 14 14   Height:      Weight:      SpO2: 100% 100% 100% 100%   Filed Weights   02/03/15 0319 02/04/15 1600 02/10/15 0500  Weight: 203 lb 11.3 oz (92.4 kg) 212 lb 8.4 oz (96.4 kg) 210 lb 5.1 oz (95.4 kg)   .Body mass index is 36.08 kg/(m^2).  GENERAL no acute distress, trach in situ, intermittent issues with agitation SKIN: no acute rashes EYES: resting comfortably been seen OROPHARYNX:NG tube in situ NECK: supple, thyroid normal size, non-tender, without nodularity LYMPH: no palpable lymphadenopathy in the cervical, axillary or inguinal area LUNGS: Distant breath breath sounds, no overt rhonchi or rales. HEART: regular rate & rhythm and no murmurs and trace pedal edema ABDOMEN:soft, non-tender and normal bowel sounds, no hepatosplenomegaly. NEURO: appears awake and alert. EXT- b/l Upper extremity swelling LL>Rt   ALLERGIES:  is allergic to morphine and related and norco.  MEDICATIONS:  Scheduled Meds: . sodium chloride  250 mL Intravenous Once  . antiseptic oral rinse  7 mL Mouth Rinse QID  . bisacodyl  10 mg Oral Daily  . chlorhexidine gluconate  15 mL Mouth Rinse BID  . clonazePAM  0.5 mg Oral BID  . fentaNYL  25 mcg Transdermal Q72H  . free water  250 mL Per Tube Q6H  . haloperidol lactate  5 mg Intravenous Q6H  . insulin aspart  0-20 Units Subcutaneous 6  times per day  . metoprolol  5 mg Intravenous Once  . metoprolol tartrate  37.5 mg Oral BID  . nystatin   Topical BID  . piperacillin-tazobactam (ZOSYN)  IV  3.375 g Intravenous Q8H  . potassium chloride  40 mEq Per Tube BID  . ranitidine  150 mg Oral BID  . senna-docusate  2 tablet Oral QHS  . Tbo-Filgrastim  480 mcg Subcutaneous Daily   Continuous Infusions: . dextrose 5 % and 0.45% NaCl 10 mL/hr at 02/13/15 1200  . feeding supplement (VITAL HIGH PROTEIN) Stopped (02/13/15 0848)  . heparin 2,200 Units/hr (02/13/15 0800)   PRN Meds:.acetaminophen, alteplase, diphenhydrAMINE, fentaNYL (SUBLIMAZE) injection, heparin lock flush, heparin lock flush, heparin lock flush, heparin lock flush, LORazepam, ondansetron (ZOFRAN) IV, sodium chloride, sodium chloride, sodium chloride, sodium chloride  REVIEW OF SYSTEMS:    10 Point review of Systems was done is negative except as noted above.   LABORATORY DATA:  I have reviewed the data as listed  . CBC Latest Ref Rng 02/13/2015 02/12/2015 02/11/2015  WBC 4.0 - 10.5 K/uL 3.4(L) 0.2(LL) 0.1(LL)  Hemoglobin 12.0 - 15.0 g/dL 10.6(L) 7.1(L) 7.6(L)  Hematocrit 36.0 - 46.0 % 32.5(L) 22.9(L) 24.6(L)  Platelets 150 - 400 K/uL 151 124(L) 96(L)    . CMP Latest Ref Rng 02/13/2015 02/12/2015 02/11/2015  Glucose 65 - 99 mg/dL 133(H) 91 101(H)  BUN 6 - 20 mg/dL  18 11 19   Creatinine 0.44 - 1.00 mg/dL 1.13(H) 0.36(L) 0.50  Sodium 135 - 145 mmol/L 142 143 141  Potassium 3.5 - 5.1 mmol/L 3.5 3.3(L) 2.8(L)  Chloride 101 - 111 mmol/L 104 104 100(L)  CO2 22 - 32 mmol/L 27 28 30   Calcium 8.9 - 10.3 mg/dL 7.8(L) 7.5(L) 7.5(L)  Total Protein 6.5 - 8.1 g/dL 5.1(L) 4.9(L) 5.1(L)  Total Bilirubin 0.3 - 1.2 mg/dL 2.2(H) 1.9(H) 2.1(H)  Alkaline Phos 38 - 126 U/L 192(H) 81 76  AST 15 - 41 U/L 17 14(L) 14(L)  ALT 14 - 54 U/L 19 21 26    . Lab Results  Component Value Date   LDH 305* 02/13/2015     RADIOGRAPHIC STUDIES: I have personally reviewed the  radiological images as listed and agreed with the findings in the report. Ct Abdomen Pelvis Wo Contrast  02/01/2015  CLINICAL DATA:  Diffuse large B-cell lymphoma undergoing chemotherapy. EXAM: CT ABDOMEN AND PELVIS WITHOUT CONTRAST TECHNIQUE: Multidetector CT imaging of the abdomen and pelvis was performed following the standard protocol without IV contrast. COMPARISON:  Recent chest CT 01/19/2015 FINDINGS: Examination is degraded by motion. Lower chest: There is air in the left chest wall likely from recent biopsy/surgery. The left-sided chest tube is in place. Extensive left lower lobe airspace consolidation which could reflect atelectasis or pneumonia. Small bilateral pleural effusions. Right basilar atelectasis. There is an NG tube coursing down the esophagus and into the stomach. Bulky mediastinal lymphadenopathy again demonstrated. Coronary artery calcifications are noted. Hepatobiliary: No focal hepatic lesions except for a small cyst in the right hepatic lobe inferiorly. No evidence of metastatic disease without IV contrast. The gallbladder is grossly normal. No common bile duct dilatation. Pancreas: No mass, inflammation or ductal dilatation. Spleen: Normal size.  No definite lesions. Adrenals/Urinary Tract: The adrenal glands are normal. Both kidneys are normal except for a right renal calculus. No hydronephrosis. Stomach/Bowel: The stomach contains an NG tube. The duodenum, small bowel and colon are unremarkable. No inflammatory changes, mass lesions or obstructive findings. A rectal catheter is in place. Vascular/Lymphatic: No mesenteric or retroperitoneal mass or adenopathy. Small scattered lymph nodes are noted. There are moderate to advanced aortic calcifications but no focal aneurysm. Other: No pelvic mass or adenopathy. No free pelvic fluid collections. A Foley catheter is noted in the bladder. The uterus is surgically absent. No inguinal mass or adenopathy. Musculoskeletal: No significant bony  findings. Degenerative changes involving the spine with severe facet disease. There is a remote appearing compression fracture of L1. IMPRESSION: 1. Left-sided chest tube in place with a small left pleural effusion. Significant left lower lobe airspace consolidation suggesting pneumonia or dense atelectasis. 2. Bulky mediastinal lymphadenopathy. 3. No adenopathy in the abdomen or pelvis. 4. No acute abdominal/pelvic findings. Electronically Signed   By: Marijo Sanes M.D.   On: 02/01/2015 21:33   Dg Chest 2 View  01/17/2015  CLINICAL DATA:  Cough and shortness of breath for the past several days EXAM: CHEST  2 VIEW COMPARISON:  None in PACs FINDINGS: There is an abnormal soft tissue mass arising from the left hilar and suprahilar regions adjacent to the AP window. The aortic knob remains reasonably well demonstrated. The cardiac silhouette is enlarged. The pulmonary vascularity is not engorged. The lungs are well-expanded. There is no focal infiltrate nor significant pleural effusion. There is moderate dextrocurvature of the thoracic spine centered at approximately T8. IMPRESSION: Large abnormal anterior mediastinal mass. This may be neoplastic or vascular in nature. CT  scanning of the chest now is recommended. These results will be called to the ordering clinician or representative by the Radiologist Assistant, and communication documented in the PACS or zVision Dashboard. Electronically Signed   By: David  Martinique M.D.   On: 01/17/2015 11:15   Ct Chest W Contrast  01/19/2015  CLINICAL DATA:  Cough, shortness of breath, possible chest mass. EXAM: CT CHEST WITH CONTRAST TECHNIQUE: Multidetector CT imaging of the chest was performed during intravenous contrast administration. CONTRAST:  75mL ISOVUE-300 IOPAMIDOL (ISOVUE-300) INJECTION 61% COMPARISON:  January 17, 2015. FINDINGS: No pneumothorax or pleural effusion is noted. 5 mm nodule is noted posteriorly in the right lower lobe best seen on image number 27  of series 4. Atherosclerosis of thoracic aorta is noted without aneurysm or dissection. Great vessels are widely patent. Mild coronary artery calcifications are noted. Moderate pericardial effusion is noted. Visualized portion of upper abdomen is unremarkable. Abnormal soft tissue density is noted in the anterior mediastinum that measures 12.0 x 8.8 cm. This is noted to compress a portion of the left innominate vein. This is most consistent with adenopathy consistent with lymphoma or metastatic disease. No significant osseous abnormality is noted. IMPRESSION: 12.0 x 8.8 cm abnormal soft tissue density seen in anterior mediastinum concerning for adenopathy related to lymphoma or other metastatic disease. Some compression of the left innominate vein is noted as a result. Moderate pericardial effusion is noted as well. 5 mm nodule is noted posteriorly in the right lower lobe. This may represent metastatic focus given above finding. Alternatively, it may be unrelated. Continued CT follow-up is recommended to ensure stability. These results will be called to the ordering clinician or representative by the Radiologist Assistant, and communication documented in the PACS or zVision Dashboard. Electronically Signed   By: Marijo Conception, M.D.   On: 01/19/2015 12:07   Dg Chest Port 1 View  02/13/2015  CLINICAL DATA:  Hx of respiratory failure/distress. Pt has a trach in place and is on a vent. Diffuse large B cell lymphoma, HTN, asthma. Chronic SOB. Pt is not verbally responsive and was not able to be woken up for exam. EXAM: PORTABLE CHEST 1 VIEW COMPARISON:  02/12/2015 FINDINGS: Area of opacity in the left mid lung with associated ppd and left hilar region fullness is stable. Vascular prominence interstitial thickening is also unchanged. There are no new areas of lung opacity. No pneumothorax. Tracheostomy tube, orogastric tube and right internal jugular central venous catheter are stable. IMPRESSION: 1. No change from  the previous day's study. 2. Stable pulmonary venous congestion and probable left perihilar atelectasis associated with the known mass. 3. Support apparatus is stable and well positioned. Electronically Signed   By: Lajean Manes M.D.   On: 02/13/2015 10:43   Dg Chest Port 1 View  02/12/2015  CLINICAL DATA:  Acute respiratory failure EXAM: PORTABLE CHEST 1 VIEW COMPARISON:  02/11/2015 FINDINGS: Tracheostomy tube is well seated. Right IJ central line, tip at the SVC level. Orogastric tube tip at the upper stomach. A bandlike opacity in the left perihilar lung has a central lucent area which has variable appearance, likely aerated lung with neighboring atelectasis rather than a cavity. Mediastinal widening correlating with known mass. No gross change in cardiopericardial size, distorted by leftward rotation, in this patient with recent sub xiphoid window. Interstitial coarsening compatible with pulmonary venous congestion, stable. No pneumothorax. IMPRESSION: 1. Unchanged positioning of tubes and central line. 2. Stable pulmonary venous congestion and left perihilar atelectasis. Electronically Signed  By: Monte Fantasia M.D.   On: 02/12/2015 07:10   Dg Chest Port 1 View  02/11/2015  CLINICAL DATA:  Acute respiratory failure EXAM: PORTABLE CHEST 1 VIEW COMPARISON:  02/10/2015 FINDINGS: Tracheostomy in good position. NG tube in the proximal stomach and unchanged Progression of left lower lobe airspace disease. Elevated left hemidiaphragm again noted. Possible small left pleural effusion. Right lung remains clear IMPRESSION: Progression of left lower lobe atelectasis/ infiltrate with elevated left hemidiaphragm. Electronically Signed   By: Franchot Gallo M.D.   On: 02/11/2015 09:14   Dg Chest Port 1 View  02/10/2015  CLINICAL DATA:  Acute respiratory failure.  Subsequent encounter. EXAM: PORTABLE CHEST 1 VIEW COMPARISON:  02/08/2015 FINDINGS: Linear and hazy airspace lung opacity projects from the left  hilum to the left mid and upper lung and left lung base, similar to the previous day's study. There is diffuse interstitial thickening bilaterally, also stable. No pneumothorax. Tracheostomy tube, right internal jugular central venous line and orogastric tube are well positioned, without significant change. IMPRESSION: 1. No significant change from the prior study. 2. Persistent opacity in the left lung, which may be due to atelectasis or pneumonia. Persistent bilateral interstitial thickening is stable. No new lung abnormalities. 3. Support apparatus is stable and well positioned. Electronically Signed   By: Lajean Manes M.D.   On: 02/10/2015 07:25   Dg Chest Port 1 View  02/08/2015  CLINICAL DATA:  Mediastinal mass. EXAM: PORTABLE CHEST 1 VIEW COMPARISON:  Earlier today FINDINGS: Tracheostomy tube tip is above the carina. There is a right IJ catheter with tip in the projection of the cavoatrial junction. There is a nasogastric tube with tip in the stomach. Cardiac enlargement and aortic atherosclerosis noted left perihilar and left base opacification is unchanged from previous exam. IMPRESSION: 1. No change in aeration to left lung compared with prior exam. 2. Stable support apparatus. Electronically Signed   By: Kerby Moors M.D.   On: 02/08/2015 16:55   Dg Chest Port 1 View  02/08/2015  CLINICAL DATA:  History of diffuse large B-cell lymphoma and melanoma. Mediastinal mass. EXAM: PORTABLE CHEST 1 VIEW COMPARISON:  02/08/2015 and 02/07/2015 FINDINGS: Tracheostomy tube and right IJ central venous catheter unchanged. Nasogastric tube courses into the stomach and off the inferior portion of the film. Stable opacification of the left perihilar region and left base. Right lung is clear. Remainder of the exam is unchanged. IMPRESSION: Stable opacification over the left perihilar region and left base. Tubes and lines unchanged. Electronically Signed   By: Marin Olp M.D.   On: 02/08/2015 10:23   Dg Chest  Port 1 View  02/08/2015  CLINICAL DATA:  Acute respiratory failure. EXAM: PORTABLE CHEST 1 VIEW COMPARISON:  February 07, 2015. FINDINGS: Tracheostomy and nasogastric tubes are unchanged in position. Stable cardiomegaly. Right internal jugular catheter is noted with distal tip in expected position of the SVC. No pneumothorax is noted. Mild central pulmonary vascular congestion is noted. Stable left perihilar and basilar opacity is noted concerning for atelectasis or pneumonia with possible associated pleural effusion. IMPRESSION: Stable support apparatus. Stable left perihilar and basilar opacity is noted concerning for pneumonia or atelectasis with associated pleural effusion. Electronically Signed   By: Marijo Conception, M.D.   On: 02/08/2015 07:26   Dg Chest Port 1 View  02/07/2015  CLINICAL DATA:  Respiratory failure.  Mediastinal mass. EXAM: PORTABLE CHEST 1 VIEW COMPARISON:  Radiographs from 02/02/2015 through 02/06/2015 FINDINGS: Tracheostomy tube, NG tube and central  catheter appear unchanged. Slight increased atelectasis at the right base. Persistent atelectasis at the left base. Pulmonary vascularity is normal. IMPRESSION: Slight increased atelectasis at the right base. Persistent atelectasis on the left. Electronically Signed   By: Lorriane Shire M.D.   On: 02/07/2015 07:42   Dg Chest Port 1 View  02/06/2015  CLINICAL DATA:  Respiratory failure EXAM: PORTABLE CHEST 1 VIEW COMPARISON:  Yesterday FINDINGS: Tubular devices are stable. Airspace disease throughout the left lung and at the right base is worse. Heart remains enlarged. Stable vascular congestion. Less aeration in yesterday. IMPRESSION: Worsening bilateral airspace disease left greater than right. Electronically Signed   By: Marybelle Killings M.D.   On: 02/06/2015 07:41   Dg Chest Port 1 View  02/05/2015  CLINICAL DATA:  Respiratory failure. Mediastinal mass with pericardial effusion. EXAM: PORTABLE CHEST 1 VIEW COMPARISON:  Radiographs  dated 02/03/2015 and 02/04/2015 FINDINGS: Central line, NG tube, and tracheostomy tube appear in good position, unchanged. There is new slight pulmonary vascular congestion. No change in slight atelectasis in left lung. Mediastinal mass is obscured due to rotation. Right lung is clear of infiltrates and atelectasis. IMPRESSION: New slight pulmonary vascular prominence. Slight atelectasis in the left midzone, stable. Electronically Signed   By: Lorriane Shire M.D.   On: 02/05/2015 07:24   Dg Chest Port 1 View  02/04/2015  CLINICAL DATA:  69 year old female with respiratory failure and mediastinal mass. EXAM: PORTABLE CHEST 1 VIEW COMPARISON:  02/03/2015 and prior radiograph FINDINGS: Tracheostomy tube, right IJ central venous catheter with tip overlying the lower SVC, and NG tube with tip overlying the proximal -mid stomach again noted. A large left mediastinal mass is again noted. The cardiomediastinal silhouette is unchanged. There is no evidence of pneumothorax. Continued left basilar atelectasis noted. IMPRESSION: Little significant change. Left mediastinal mass with left basilar atelectasis Electronically Signed   By: Margarette Canada M.D.   On: 02/04/2015 07:42   Dg Chest Port 1 View  02/03/2015  CLINICAL DATA:  Respiratory distress. Patient with a tracheostomy. Subsequent encounter. EXAM: PORTABLE CHEST 1 VIEW COMPARISON:  02/02/2015 FINDINGS: Masslike opacity obscures the left hilum and left mediastinum merging with the left heart border. This is without significant change. No convincing pulmonary edema. Probable atelectasis at the left lung base. No pneumothorax or obvious pleural effusion. Tracheostomy tube and right internal jugular central venous line are stable and well positioned. New orogastric tube passes below the diaphragm into the stomach. IMPRESSION: 1. No acute findings in the lungs. 2. Stable masslike opacity along the anterior mediastinum. 3. New orogastric tube is well positioned.  Tracheostomy tube and right internal jugular central venous line are stable and also well positioned. Electronically Signed   By: Lajean Manes M.D.   On: 02/03/2015 08:34   Dg Chest Port 1 View  02/02/2015  CLINICAL DATA:  Tracheostomy tube placement. EXAM: PORTABLE CHEST 1 VIEW COMPARISON:  Earlier film 02/02/2015. FINDINGS: The tracheostomy tube is in good position with the tip at the mid tracheal level. The right IJ catheter is stable. Stable mediastinal adenopathy/mass and persistent left lower lobe infiltrate/atelectasis/effusion. IMPRESSION: Tracheostomy tube in good position without complicating features. Electronically Signed   By: Marijo Sanes M.D.   On: 02/02/2015 12:18   Dg Chest Port 1 View  02/02/2015  CLINICAL DATA:  Shortness of breath. EXAM: PORTABLE CHEST 1 VIEW COMPARISON:  02/01/2015 FINDINGS: Examination is partially limited by leftward patient rotation. Endotracheal tube terminates above the level of the thoracic inlet and has  likely been slightly retracted in the interim. Enteric tube courses towards the left upper abdomen with tip not imaged. Right jugular central venous catheter remains, projecting over the SVC. Known, large mediastinal mass is again seen. Cardiac silhouette remains enlarged. Left chest tube remains in place. No pneumothorax is identified. Left basilar opacities have slightly improved. Mild right basilar opacity is unchanged may reflect atelectasis. IMPRESSION: 1. Mild interval retraction of the endotracheal tube which projects above the thoracic inlet. Consider advancement. 2. Minimally improved aeration of the left lung base. No pneumothorax. Electronically Signed   By: Logan Bores M.D.   On: 02/02/2015 07:18   Dg Chest Port 1 View  02/01/2015  CLINICAL DATA:  Pericardial window.  Pericardial effusion. EXAM: PORTABLE CHEST 1 VIEW COMPARISON:  01/31/2015 FINDINGS: Endotracheal tube in good position. NG tube in place with the tip not visualized due to  underpenetration. Right jugular central venous catheter tip in the SVC. No pneumothorax on the right Interval improvement in aeration of the left lung with decrease in density left lower lobe. This may be due to decreased diffusion atelectasis. Left chest tube remains in place. No pneumothorax on the left. Cardiac enlargement has improved. Right lower lobe atelectasis is present. IMPRESSION: Endotracheal tube in good position. Decreased left lower lobe density with improved aeration of the left lung. Left chest tube in place without pneumothorax. Electronically Signed   By: Franchot Gallo M.D.   On: 02/01/2015 07:16   Dg Chest Port 1 View  01/31/2015  CLINICAL DATA:  Status post median sternotomy and pericardial window creation for pleural effusion, acute respiratory failure, cardiac tamponade, mediastinal mass. EXAM: PORTABLE CHEST 1 VIEW COMPARISON:  Portable chest x-ray of January 27, 2015 FINDINGS: The trachea has apparently been extubated. There is linear radiodensity that projects over the lower neck which could be a portion of the endotracheal tube but if so it is positioned quite high. The right lung remains well-expanded and clear. On the left only a small amount of aerated lung persists in the apex and laterally. The left chest tube is stable. The large mediastinal mass is unchanged. There is no pneumothorax nor definite pleural effusion. The esophagogastric tube tip projects below the inferior margin of the image. The right internal jugular venous catheter tip projects over the midportion of the SVC. IMPRESSION: Stable appearance of the chest following extubation of the trachea. Persistent large mediastinal mass. There is no pneumothorax. Electronically Signed   By: David  Martinique M.D.   On: 01/31/2015 07:33   Dg Chest Port 1 View  01/30/2015  CLINICAL DATA:  Persistent fever. Status post pericardial window and mediastinal biopsy. EXAM: PORTABLE CHEST 1 VIEW COMPARISON:  Multiple chest x-rays and  chest CT from 01/19/2015 FINDINGS: The need support apparatus is stable. The endotracheal tube is 4.3 cm above the carina. The NG tube is stable. The left-sided chest tube is stable. Persistent large mediastinal mass occupying good portion of the left hemi thorax. Persistent vascular congestion and bibasilar atelectasis. IMPRESSION: Stable support apparatus. Stable left-sided chest tube.  No definite pneumothorax. Stable large mass occupying a good portion of the left hemi thorax. Electronically Signed   By: Marijo Sanes M.D.   On: 01/30/2015 23:30   Dg Chest Port 1 View  01/30/2015  CLINICAL DATA:  Intubated patient, acute respiratory failure and hypoxia, history of mediastinal mass, pericardial effusion and cardiac tamponade EXAM: PORTABLE CHEST 1 VIEW COMPARISON:  Portable chest x-ray of January 29, 2015. FINDINGS: Stable volume loss on the  left with large mediastinal mass. The left-sided chest tubes are unchanged in position. There is no pneumothorax nor large pleural effusion. The right lung is well-expanded. Mild interstitial prominence throughout the right lung is stable. The pulmonary vascularity is not engorged. The endotracheal tube tip lies 3.8 cm above the carina. The right internal jugular venous catheter tip projects over the midportion of the SVC. The esophagogastric tube tip projects below the inferior margin of the image. IMPRESSION: There has been no significant change in the appearance of the chest since yesterday's study. The support tubes are in reasonable position. Electronically Signed   By: David  Martinique M.D.   On: 01/30/2015 07:21   Dg Chest Port 1 View  01/29/2015  CLINICAL DATA:  Shortness of breath, acute respiratory failure, intubated patient, history of mediastinal mass, pericardial effusion, and cardiac tamponade. EXAM: PORTABLE CHEST 1 VIEW COMPARISON:  Portable chest x-ray of January 28, 2015 FINDINGS: The right lung is well-expanded. There is a trace of pleural fluid at the  right lung base. On the left the cardiac silhouette occupies much of the hemi thorax. A small amount of aerated lung in the apex and inferior laterally is still demonstrated. The endotracheal tube tip projects approximately 3.7 cm above the carina. The esophagogastric tube tip projects below the inferior margin of the image. A left-sided chest tube has its tip located over the lateral aspect of the approximately 6 rib and is stable. A second tube versus the tip of the esophagogastric tube is noted medially at the left lung base. IMPRESSION: Persistent large mediastinal mass on the left. Slight interval worsening in volume loss on the left. The cardiac silhouette remains enlarged. No pneumothorax nor large left pleural effusion is observed. The right lung is largely clear. Electronically Signed   By: David  Martinique M.D.   On: 01/29/2015 07:25   Dg Chest Port 1 View  01/28/2015  CLINICAL DATA:  ET tube placement EXAM: PORTABLE CHEST 1 VIEW COMPARISON:  01/27/15 FINDINGS: The ET tube tip is stable above the carina. There is a right IJ catheter with tip in the projection of the SVC. Left-sided chest tube is identified. No pneumothorax. Stable cardiac enlargement and stable large anterior mediastinal mass. No change in small bilateral pleural effusions and pulmonary edema consistent with CHF. IMPRESSION: 1. Left chest tube in place without visible pneumothorax. 2. Large anterior mediastinal mass 3. Bilateral pleural effusions and pulmonary edema. Electronically Signed   By: Kerby Moors M.D.   On: 01/28/2015 08:28   Dg Chest Port 1 View  01/27/2015  CLINICAL DATA:  Pericardial effusion EXAM: PORTABLE CHEST 1 VIEW COMPARISON:  01/29/2015 FINDINGS: ET tube tip is stable above the carina. There is a right IJ catheter with tip in the projection of the SVC. Left chest tube is in place. No pneumothorax. NG tube tip is in the stomach. Cardiac enlargement and large anterior mediastinal mass is again noted. Unchanged from  previous exam. Increase in pleural fluid noted bilaterally. IMPRESSION: 1. Stable support apparatus. 2. Left chest tube without pneumothorax. 3. Increase in bilateral pleural effusions. Electronically Signed   By: Kerby Moors M.D.   On: 01/27/2015 09:00   Dg Chest Portable 1 View  02/18/2015  CLINICAL DATA:  Status post pericardial window and Chamberlain procedure for large mediastinal mass with associated pericardial effusion. EXAM: PORTABLE CHEST 1 VIEW COMPARISON:  CT of the chest on 01/19/2015 FINDINGS: Endotracheal tube present with the tip approximately 2.5 cm above the carina. Right jugular central line  has been placed with the catheter tip in the SVC. Pericardial drain and left-sided chest tube present. Nasogastric tube extends into the distal esophagus and does not extend below the diaphragm. No pneumothorax identified. Lung volumes are very low bilaterally. Huge mediastinal mass again visualized which is more prominent to the left of midline. IMPRESSION: 1. No evidence of pneumothorax postoperatively. 2. Low lung volumes. 3. Nasogastric tube tip lies in the distal esophagus. Electronically Signed   By: Aletta Edouard M.D.   On: 02/09/2015 14:30   Dg Abd Portable 1v  02/12/2015  CLINICAL DATA:  Followup distended abdomen. Evaluate NG tube placement. EXAM: PORTABLE ABDOMEN - 1 VIEW COMPARISON:  02/08/2015 FINDINGS: The nasogastric tube tip is in the expected location of the duodenum. Gaseous distension of the large and small bowel loops identified and appear increased from previous exam. IMPRESSION: 1. NG tube tip is in the duodenum. 2. Persistent gaseous distension of bowel loops. Electronically Signed   By: Kerby Moors M.D.   On: 02/12/2015 09:29   Dg Abd Portable 1v  02/08/2015  CLINICAL DATA:  Nausea and vomiting. History of lymphoma and melanoma. EXAM: PORTABLE ABDOMEN - 1 VIEW COMPARISON:  02/05/2015. FINDINGS: Scattered air throughout the small bowel and colon and some residual  contrast in the colon. Findings suggest a mild ileus. IMPRESSION: Persistent mild ileus bowel gas pattern. Electronically Signed   By: Marijo Sanes M.D.   On: 02/08/2015 10:09   Dg Abd Portable 1v  02/05/2015  CLINICAL DATA:  NG tube placement. EXAM: PORTABLE ABDOMEN - 1 VIEW COMPARISON:  CT scan 02/01/2015 FINDINGS: The NG tube is in the body region of the stomach. Scattered air in the small bowel and colon along with some contrast in the colon could suggest a mild ileus. No free air. Bibasilar atelectasis. IMPRESSION: NG tube in good position in the body region the stomach. Moderate air throughout the bowel may suggest a diffuse ileus. Electronically Signed   By: Marijo Sanes M.D.   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 LDH level 950. 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 Cytogenetics and FISH studies pending. Low CD20 positivity which can be seen about 16-20% of diffuse large B-cell lymphoma is and could potentially portend a poor prognosis independent of the IPI index. (Blood 2009 [id number] Hilaria Ota et al.) Cannot r/o a double hit lymphoma - FISH for BCL2 and cMYC pending results. Blood counts stable. On presentation had difficulty with breathing due to airway compression. 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 Decreasing LDH levels suggestive of response to treatment. Patient had better weaning trials today. S/p first cycle of CHOP on 02/02/2015 and Rituxan 02/05/2015. LDH down from 950 to about 300 suggesting good response to treatment thus far but still continues to have multifactorial challenges with weaning. Patient's neutropenia  appears to have resolved with G-CSF.  #2 pancytopenia related to chemotherapy and lymphoma. Symptomatic anemia much improved after blood transfusions. #3 Febrile neutropenia on Zosyn and vancomycin.  Receiving daily G-CSF. Neutropenia appears to have resolved today with G-CSF use. Plan -continue to monitor cbc daily.  -started on G-CSF on 11/18 [place] daily for neutropenia. Will likely discontinue tomorrow if [place] still >1000 x -on broad-spectrum antibiotics  for neutropenic fever. -ongoing weaning trials as per pulmonary/CCM team -if double hit lymphoma or even based on high risk features might consider EPOCH-R from cycle 2 depending on how patient does. Cycle 2 is scheduled for 12/2. Patient will need G-CSF support prophylactically from cycle 2. -Will need outpatient PET/CT scan as possible.   #2 ?HCAP/ventilator acquired pneumonia - elevated procalcitonin --- improving. Strept in secretions treated with Ceftriaxone - now off antibiotics. ?underlying sleep apnea - given body habitus. Might have lower O2 sats at baseline. Now on broad spectrum antibiotics for febrile neutropenia. Plan -antibiotics/weaning trials and vent management as per critical care team. Apparently agitation and not oxygenation or lung collapse have been limiting factors.  #3 Previous history of upper extremity melanoma status post resection couple of years ago. Family notes that she was followed at [facility] with repeat scans that showed no evidence of metastatic disease or recurrence. She was last evaluated about a year ago.  #5 history of rheumatoid arthritis previously on methotrexate. Off methotrexate now on chemotherapy for her diffuse large B-cell lymphoma.   #6 Hyperglycemia due to steroids ?h/o DM2. Improving control off steroids.  #7 LUE swelling - acute DVT noted on [place]- like related to left innomiate venous compression by mediastinal mass and hypercoag state from lymphoma, hospitalization obesity etc. Plan -on iv  heparin currently. -would need to transition to Lovenox eventually if renal function stable.  #8 Anasarca due to hypoalbuminemia due to hypercatabolic state in the setting of lymphoma/chemotherapy/acute ICU status. -getting tube feeding. -might need albumin assisted diuresis if fluid overload becomes an issue with weaning.  Will continue to follow.  Sullivan Lone MD [place] AAHIVMS [facility] Hematology/Oncology [facility]  (Office):       [phone] (Work cell):  [phone] (Fax):           [phone]

## 2015-02-13 NOTE — Progress Notes (Addendum)
CRITICAL VALUE ALERT  Critical value received:  Vancomycin trough 33  Date of notification:  02/13/2015  Time of notification:  [id number]  Critical value read back:Yes.    Nurse who received alert:  Chana Bode, RN  MD notified (1st page):  Noe Gens, NP  Time of first page:  [phone]  MD notified (2nd page):  Time of second page:  Responding MD:  Noe Gens, NP  Time MD responded:  [phone], notified in person

## 2015-02-13 NOTE — Progress Notes (Signed)
Pharmacy Antibiotic Time-Out Note  Maria Bauer is a 69 y.o. year-old female admitted on 02/04/2015.  The patient is currently on  for Zosyn 3.375 grams IV q8h (by extended infusion) and vancomycin 1250 mg IV q12h for neutropenic fever.  Assessment: Neutropenia improving with Granix (ANC=2.7). Blood culture from 11/18 growing coag-negative Staph in 1 of 2 sets (significance uncertain). SCr now rising (0.36 > 1.13) Vancomycin trough today was elevated, but this is an artifact due to delay in collecting the sample.  Plan:  Hold vancomycin  Check random vancomycin level in AM 11/23  Follow ANC, renal function, clinical course.  Review planned DOT.   Temp (24hrs), Avg:99.2 F (37.3 C), Min:98.4 F (36.9 C), Max:100.1 F (37.8 C)   Recent Labs Lab 02/10/15 0623 02/10/15 1704 02/11/15 0630 02/12/15 0500 02/13/15 0555  WBC 0.1* 0.3* 0.1* 0.2* 3.4*     Recent Labs Lab 02/09/15 0508 02/10/15 0623 02/11/15 0630 02/12/15 0500 02/13/15 0555  CREATININE 0.34* 0.39* 0.50 0.36* 1.13*   Estimated Creatinine Clearance: 51.9 mL/min (by C-G formula based on Cr of 1.13).    Antimicrobial allergies: none  Antimicrobials this admission: 1/4 >> Cefuroxime (post-op abx) >> 11/8 11/9 >> Ceftazidime >> 11/12 11/12 >> Rocephin >>11/16 11/19 >> Vanc >> 11/19 >> Zosyn >>  Levels/dose changes this admission: 11/21: vanc level 37, but was drawn after dose instead of before 11/22: vanc trough 33 drawn before dose this AM  Microbiology Results: 11/3 MRSA PCR: negative 11/5 Trach asp: NGF 11/7 Cdiff: Ag negative, Toxin negative 11/8 Blood x2: NGF 11/9 Trach asp: moderate B-hem strep (not group A) - F 11/18 blood: 1/2 coag-negative Staph   Thank you for allowing pharmacy to be a part of this patient's care.  Peggyann Juba, PharmD, BCPS Pager: [phone]  02/13/2015 9:57 AM

## 2015-02-13 NOTE — Progress Notes (Signed)
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 She is still neutropenic and appears to be getting more anemic.  She was on trach collar for 3 hours yesterday about has not been able to tolerate it today due to ongoing issues with agitation.   Resting comfortably when seen by me with intermittent coughing.  We have ordered PRBC transfusion in the setting of chemotherapy and multifactorial anemia and given the patient has limited bone marrow and physiologic reserves while requiring respiratory support.  OBJECTIVE:  PHYSICAL EXAMINATION: . Filed Vitals:   02/13/15 0200 02/13/15 0300 02/13/15 0348 02/13/15 0500  BP: 120/34 130/46 130/46   Pulse: 92 75 77   Temp:    99.5 F (37.5 C)  TempSrc:    Axillary  Resp: 17 19 16    Height:      Weight:      SpO2: 100% 100% 100%    Filed Weights   02/03/15 0319 02/04/15 1600 02/10/15 0500  Weight: 203 lb 11.3 oz (92.4 kg) 212 lb 8.4 oz (96.4 kg) 210 lb 5.1 oz (95.4 kg)   .Body mass index is 36.08 kg/(m^2).   GENERAL no acute distress, trach in situ, intermittent issues with agitation SKIN: no acute rashes EYES: resting comfortably been seen OROPHARYNX:NG tube in situ NECK: supple, thyroid normal size, non-tender, without nodularity LYMPH: no palpable lymphadenopathy in the cervical, axillary or inguinal area LUNGS: Distant breath breath sounds, no overt rhonchi or rales. HEART: regular rate & rhythm and no murmurs and trace pedal edema ABDOMEN:soft, non-tender and normal bowel sounds, no hepatosplenomegaly. NEURO: appears awake and alert. EXT- b/l Upper extremity swelling LL>Rt   ALLERGIES:  is allergic to morphine and related and norco.  MEDICATIONS:  Scheduled Meds: . sodium chloride  250 mL Intravenous Once  . antiseptic oral rinse  7 mL Mouth Rinse QID    . bisacodyl  10 mg Oral Daily  . chlorhexidine gluconate  15 mL Mouth Rinse BID  . clonazePAM  0.5 mg Oral BID  . fentaNYL  25 mcg Transdermal Q72H  . free water  250 mL Per Tube Q6H  . insulin aspart  0-20 Units Subcutaneous 6 times per day  . metoprolol  5 mg Intravenous Once  . metoprolol tartrate  37.5 mg Oral BID  . nystatin   Topical BID  . piperacillin-tazobactam (ZOSYN)  IV  3.375 g Intravenous Q8H  . QUEtiapine  25 mg Oral BID  . ranitidine  150 mg Oral BID  . senna-docusate  2 tablet Oral QHS  . Tbo-Filgrastim  480 mcg Subcutaneous Daily  . vancomycin  1,250 mg Intravenous Q12H   Continuous Infusions: . dextrose 5 % and 0.45% NaCl 10 mL/hr at 02/13/15 0300  . feeding supplement (VITAL HIGH PROTEIN)    . heparin 2,400 Units/hr (02/13/15 0300)   PRN Meds:.acetaminophen, alteplase, diphenhydrAMINE, fentaNYL (SUBLIMAZE) injection, heparin lock flush, heparin lock flush, heparin lock flush, heparin lock flush, LORazepam, ondansetron (ZOFRAN) IV, sodium chloride, sodium chloride, sodium chloride, sodium chloride  REVIEW OF SYSTEMS:    10 Point review of Systems was done is negative except as noted above.   LABORATORY DATA:  I have reviewed the data as listed  . CBC Latest Ref Rng 02/12/2015 02/11/2015 02/10/2015  WBC 4.0 - 10.5 K/uL 0.2(LL) 0.1(LL) 0.3(LL)  Hemoglobin 12.0 - 15.0 g/dL 7.1(L) 7.6(L) 8.4(L)  Hematocrit 36.0 - 46.0 % 22.9(L) 24.6(L) 26.9(L)  Platelets 150 - 400 K/uL 124(L) 96(L) 112(L)    . CMP Latest Ref Rng 02/12/2015 02/11/2015 02/10/2015  Glucose 65 - 99 mg/dL 91 101(H) 103(H)  BUN 6 - 20 mg/dL 11 19 21(H)  Creatinine 0.44 - 1.00 mg/dL 0.36(L) 0.50 0.39(L)  Sodium 135 - 145 mmol/L 143 141 142  Potassium 3.5 - 5.1 mmol/L 3.3(L) 2.8(L) 3.6  Chloride 101 - 111 mmol/L 104 100(L) 99(L)  CO2 22 - 32 mmol/L 28 30 33(H)  Calcium 8.9 - 10.3 mg/dL 7.5(L) 7.5(L) 8.0(L)  Total Protein 6.5 - 8.1 g/dL 4.9(L) 5.1(L) 5.1(L)  Total Bilirubin 0.3 - 1.2 mg/dL  1.9(H) 2.1(H) 2.4(H)  Alkaline Phos 38 - 126 U/L 81 76 85  AST 15 - 41 U/L 14(L) 14(L) 16  ALT 14 - 54 U/L 21 26 33   . Lab Results  Component Value Date   LDH 407* 02/08/2015     RADIOGRAPHIC STUDIES: I have personally reviewed the radiological images as listed and agreed with the findings in the report. Ct Abdomen Pelvis Wo Contrast  02/01/2015  CLINICAL DATA:  Diffuse large B-cell lymphoma undergoing chemotherapy. EXAM: CT ABDOMEN AND PELVIS WITHOUT CONTRAST TECHNIQUE: Multidetector CT imaging of the abdomen and pelvis was performed following the standard protocol without IV contrast. COMPARISON:  Recent chest CT 01/19/2015 FINDINGS: Examination is degraded by motion. Lower chest: There is air in the left chest wall likely from recent biopsy/surgery. The left-sided chest tube is in place. Extensive left lower lobe airspace consolidation which could reflect atelectasis or pneumonia. Small bilateral pleural effusions. Right basilar atelectasis. There is an NG tube coursing down the esophagus and into the stomach. Bulky mediastinal lymphadenopathy again demonstrated. Coronary artery calcifications are noted. Hepatobiliary: No focal hepatic lesions except for a small cyst in the right hepatic lobe inferiorly. No evidence of metastatic disease without IV contrast. The gallbladder is grossly normal. No common bile duct dilatation. Pancreas: No mass, inflammation or ductal dilatation. Spleen: Normal size.  No definite lesions. Adrenals/Urinary Tract: The adrenal glands are normal. Both kidneys are normal except for a right renal calculus. No hydronephrosis. Stomach/Bowel: The stomach contains an NG tube. The duodenum, small bowel and colon are unremarkable. No inflammatory changes, mass lesions or obstructive findings. A rectal catheter is in place. Vascular/Lymphatic: No mesenteric or retroperitoneal mass or adenopathy. Small scattered lymph nodes are noted. There are moderate to advanced aortic  calcifications but no focal aneurysm. Other: No pelvic mass or adenopathy. No free pelvic fluid collections. A Foley catheter is noted in the bladder. The uterus is surgically absent. No inguinal mass or adenopathy. Musculoskeletal: No significant bony findings. Degenerative changes involving the spine with severe facet disease. There is a remote appearing compression fracture of L1. IMPRESSION: 1. Left-sided chest tube in place with a small left pleural effusion. Significant left lower lobe airspace consolidation suggesting pneumonia or dense atelectasis. 2. Bulky mediastinal lymphadenopathy. 3. No adenopathy in the abdomen or pelvis. 4. No acute abdominal/pelvic findings. Electronically Signed   By: Marijo Sanes M.D.   On: 02/01/2015 21:33   Dg Chest 2 View  01/17/2015  CLINICAL DATA:  Cough and shortness of breath for the past several days EXAM: CHEST  2 VIEW COMPARISON:  None in PACs FINDINGS: There is an abnormal soft tissue mass arising from the left hilar and suprahilar regions adjacent to the AP window. The aortic knob remains reasonably well demonstrated. The cardiac silhouette is enlarged. The pulmonary vascularity  is not engorged. The lungs are well-expanded. There is no focal infiltrate nor significant pleural effusion. There is moderate dextrocurvature of the thoracic spine centered at approximately T8. IMPRESSION: Large abnormal anterior mediastinal mass. This may be neoplastic or vascular in nature. CT scanning of the chest now is recommended. These results will be called to the ordering clinician or representative by the Radiologist Assistant, and communication documented in the PACS or zVision Dashboard. Electronically Signed   By: David  Martinique M.D.   On: 01/17/2015 11:15   Ct Chest W Contrast  01/19/2015  CLINICAL DATA:  Cough, shortness of breath, possible chest mass. EXAM: CT CHEST WITH CONTRAST TECHNIQUE: Multidetector CT imaging of the chest was performed during intravenous contrast  administration. CONTRAST:  35mL ISOVUE-300 IOPAMIDOL (ISOVUE-300) INJECTION 61% COMPARISON:  January 17, 2015. FINDINGS: No pneumothorax or pleural effusion is noted. 5 mm nodule is noted posteriorly in the right lower lobe best seen on image number 27 of series 4. Atherosclerosis of thoracic aorta is noted without aneurysm or dissection. Great vessels are widely patent. Mild coronary artery calcifications are noted. Moderate pericardial effusion is noted. Visualized portion of upper abdomen is unremarkable. Abnormal soft tissue density is noted in the anterior mediastinum that measures 12.0 x 8.8 cm. This is noted to compress a portion of the left innominate vein. This is most consistent with adenopathy consistent with lymphoma or metastatic disease. No significant osseous abnormality is noted. IMPRESSION: 12.0 x 8.8 cm abnormal soft tissue density seen in anterior mediastinum concerning for adenopathy related to lymphoma or other metastatic disease. Some compression of the left innominate vein is noted as a result. Moderate pericardial effusion is noted as well. 5 mm nodule is noted posteriorly in the right lower lobe. This may represent metastatic focus given above finding. Alternatively, it may be unrelated. Continued CT follow-up is recommended to ensure stability. These results will be called to the ordering clinician or representative by the Radiologist Assistant, and communication documented in the PACS or zVision Dashboard. Electronically Signed   By: Marijo Conception, M.D.   On: 01/19/2015 12:07   Dg Chest Port 1 View  02/12/2015  CLINICAL DATA:  Acute respiratory failure EXAM: PORTABLE CHEST 1 VIEW COMPARISON:  02/11/2015 FINDINGS: Tracheostomy tube is well seated. Right IJ central line, tip at the SVC level. Orogastric tube tip at the upper stomach. A bandlike opacity in the left perihilar lung has a central lucent area which has variable appearance, likely aerated lung with neighboring atelectasis  rather than a cavity. Mediastinal widening correlating with known mass. No gross change in cardiopericardial size, distorted by leftward rotation, in this patient with recent sub xiphoid window. Interstitial coarsening compatible with pulmonary venous congestion, stable. No pneumothorax. IMPRESSION: 1. Unchanged positioning of tubes and central line. 2. Stable pulmonary venous congestion and left perihilar atelectasis. Electronically Signed   By: Monte Fantasia M.D.   On: 02/12/2015 07:10   Dg Chest Port 1 View  02/11/2015  CLINICAL DATA:  Acute respiratory failure EXAM: PORTABLE CHEST 1 VIEW COMPARISON:  02/10/2015 FINDINGS: Tracheostomy in good position. NG tube in the proximal stomach and unchanged Progression of left lower lobe airspace disease. Elevated left hemidiaphragm again noted. Possible small left pleural effusion. Right lung remains clear IMPRESSION: Progression of left lower lobe atelectasis/ infiltrate with elevated left hemidiaphragm. Electronically Signed   By: Franchot Gallo M.D.   On: 02/11/2015 09:14   Dg Chest Port 1 View  02/10/2015  CLINICAL DATA:  Acute respiratory failure.  Subsequent encounter. EXAM: PORTABLE CHEST 1 VIEW COMPARISON:  02/08/2015 FINDINGS: Linear and hazy airspace lung opacity projects from the left hilum to the left mid and upper lung and left lung base, similar to the previous day's study. There is diffuse interstitial thickening bilaterally, also stable. No pneumothorax. Tracheostomy tube, right internal jugular central venous line and orogastric tube are well positioned, without significant change. IMPRESSION: 1. No significant change from the prior study. 2. Persistent opacity in the left lung, which may be due to atelectasis or pneumonia. Persistent bilateral interstitial thickening is stable. No new lung abnormalities. 3. Support apparatus is stable and well positioned. Electronically Signed   By: Lajean Manes M.D.   On: 02/10/2015 07:25   Dg Chest Port 1  View  02/08/2015  CLINICAL DATA:  Mediastinal mass. EXAM: PORTABLE CHEST 1 VIEW COMPARISON:  Earlier today FINDINGS: Tracheostomy tube tip is above the carina. There is a right IJ catheter with tip in the projection of the cavoatrial junction. There is a nasogastric tube with tip in the stomach. Cardiac enlargement and aortic atherosclerosis noted left perihilar and left base opacification is unchanged from previous exam. IMPRESSION: 1. No change in aeration to left lung compared with prior exam. 2. Stable support apparatus. Electronically Signed   By: Kerby Moors M.D.   On: 02/08/2015 16:55   Dg Chest Port 1 View  02/08/2015  CLINICAL DATA:  History of diffuse large B-cell lymphoma and melanoma. Mediastinal mass. EXAM: PORTABLE CHEST 1 VIEW COMPARISON:  02/08/2015 and 02/07/2015 FINDINGS: Tracheostomy tube and right IJ central venous catheter unchanged. Nasogastric tube courses into the stomach and off the inferior portion of the film. Stable opacification of the left perihilar region and left base. Right lung is clear. Remainder of the exam is unchanged. IMPRESSION: Stable opacification over the left perihilar region and left base. Tubes and lines unchanged. Electronically Signed   By: Marin Olp M.D.   On: 02/08/2015 10:23   Dg Chest Port 1 View  02/08/2015  CLINICAL DATA:  Acute respiratory failure. EXAM: PORTABLE CHEST 1 VIEW COMPARISON:  February 07, 2015. FINDINGS: Tracheostomy and nasogastric tubes are unchanged in position. Stable cardiomegaly. Right internal jugular catheter is noted with distal tip in expected position of the SVC. No pneumothorax is noted. Mild central pulmonary vascular congestion is noted. Stable left perihilar and basilar opacity is noted concerning for atelectasis or pneumonia with possible associated pleural effusion. IMPRESSION: Stable support apparatus. Stable left perihilar and basilar opacity is noted concerning for pneumonia or atelectasis with associated pleural  effusion. Electronically Signed   By: Marijo Conception, M.D.   On: 02/08/2015 07:26   Dg Chest Port 1 View  02/07/2015  CLINICAL DATA:  Respiratory failure.  Mediastinal mass. EXAM: PORTABLE CHEST 1 VIEW COMPARISON:  Radiographs from 02/02/2015 through 02/06/2015 FINDINGS: Tracheostomy tube, NG tube and central catheter appear unchanged. Slight increased atelectasis at the right base. Persistent atelectasis at the left base. Pulmonary vascularity is normal. IMPRESSION: Slight increased atelectasis at the right base. Persistent atelectasis on the left. Electronically Signed   By: Lorriane Shire M.D.   On: 02/07/2015 07:42   Dg Chest Port 1 View  02/06/2015  CLINICAL DATA:  Respiratory failure EXAM: PORTABLE CHEST 1 VIEW COMPARISON:  Yesterday FINDINGS: Tubular devices are stable. Airspace disease throughout the left lung and at the right base is worse. Heart remains enlarged. Stable vascular congestion. Less aeration in yesterday. IMPRESSION: Worsening bilateral airspace disease left greater than right. Electronically Signed  By: Marybelle Killings M.D.   On: 02/06/2015 07:41   Dg Chest Port 1 View  02/05/2015  CLINICAL DATA:  Respiratory failure. Mediastinal mass with pericardial effusion. EXAM: PORTABLE CHEST 1 VIEW COMPARISON:  Radiographs dated 02/03/2015 and 02/04/2015 FINDINGS: Central line, NG tube, and tracheostomy tube appear in good position, unchanged. There is new slight pulmonary vascular congestion. No change in slight atelectasis in left lung. Mediastinal mass is obscured due to rotation. Right lung is clear of infiltrates and atelectasis. IMPRESSION: New slight pulmonary vascular prominence. Slight atelectasis in the left midzone, stable. Electronically Signed   By: Lorriane Shire M.D.   On: 02/05/2015 07:24   Dg Chest Port 1 View  02/04/2015  CLINICAL DATA:  69 year old female with respiratory failure and mediastinal mass. EXAM: PORTABLE CHEST 1 VIEW COMPARISON:  02/03/2015 and prior  radiograph FINDINGS: Tracheostomy tube, right IJ central venous catheter with tip overlying the lower SVC, and NG tube with tip overlying the proximal -mid stomach again noted. A large left mediastinal mass is again noted. The cardiomediastinal silhouette is unchanged. There is no evidence of pneumothorax. Continued left basilar atelectasis noted. IMPRESSION: Little significant change. Left mediastinal mass with left basilar atelectasis Electronically Signed   By: Margarette Canada M.D.   On: 02/04/2015 07:42   Dg Chest Port 1 View  02/03/2015  CLINICAL DATA:  Respiratory distress. Patient with a tracheostomy. Subsequent encounter. EXAM: PORTABLE CHEST 1 VIEW COMPARISON:  02/02/2015 FINDINGS: Masslike opacity obscures the left hilum and left mediastinum merging with the left heart border. This is without significant change. No convincing pulmonary edema. Probable atelectasis at the left lung base. No pneumothorax or obvious pleural effusion. Tracheostomy tube and right internal jugular central venous line are stable and well positioned. New orogastric tube passes below the diaphragm into the stomach. IMPRESSION: 1. No acute findings in the lungs. 2. Stable masslike opacity along the anterior mediastinum. 3. New orogastric tube is well positioned. Tracheostomy tube and right internal jugular central venous line are stable and also well positioned. Electronically Signed   By: Lajean Manes M.D.   On: 02/03/2015 08:34   Dg Chest Port 1 View  02/02/2015  CLINICAL DATA:  Tracheostomy tube placement. EXAM: PORTABLE CHEST 1 VIEW COMPARISON:  Earlier film 02/02/2015. FINDINGS: The tracheostomy tube is in good position with the tip at the mid tracheal level. The right IJ catheter is stable. Stable mediastinal adenopathy/mass and persistent left lower lobe infiltrate/atelectasis/effusion. IMPRESSION: Tracheostomy tube in good position without complicating features. Electronically Signed   By: Marijo Sanes M.D.   On:  02/02/2015 12:18   Dg Chest Port 1 View  02/02/2015  CLINICAL DATA:  Shortness of breath. EXAM: PORTABLE CHEST 1 VIEW COMPARISON:  02/01/2015 FINDINGS: Examination is partially limited by leftward patient rotation. Endotracheal tube terminates above the level of the thoracic inlet and has likely been slightly retracted in the interim. Enteric tube courses towards the left upper abdomen with tip not imaged. Right jugular central venous catheter remains, projecting over the SVC. Known, large mediastinal mass is again seen. Cardiac silhouette remains enlarged. Left chest tube remains in place. No pneumothorax is identified. Left basilar opacities have slightly improved. Mild right basilar opacity is unchanged may reflect atelectasis. IMPRESSION: 1. Mild interval retraction of the endotracheal tube which projects above the thoracic inlet. Consider advancement. 2. Minimally improved aeration of the left lung base. No pneumothorax. Electronically Signed   By: Logan Bores M.D.   On: 02/02/2015 07:18   Dg Chest  Port 1 View  02/01/2015  CLINICAL DATA:  Pericardial window.  Pericardial effusion. EXAM: PORTABLE CHEST 1 VIEW COMPARISON:  01/31/2015 FINDINGS: Endotracheal tube in good position. NG tube in place with the tip not visualized due to underpenetration. Right jugular central venous catheter tip in the SVC. No pneumothorax on the right Interval improvement in aeration of the left lung with decrease in density left lower lobe. This may be due to decreased diffusion atelectasis. Left chest tube remains in place. No pneumothorax on the left. Cardiac enlargement has improved. Right lower lobe atelectasis is present. IMPRESSION: Endotracheal tube in good position. Decreased left lower lobe density with improved aeration of the left lung. Left chest tube in place without pneumothorax. Electronically Signed   By: Franchot Gallo M.D.   On: 02/01/2015 07:16   Dg Chest Port 1 View  01/31/2015  CLINICAL DATA:  Status  post median sternotomy and pericardial window creation for pleural effusion, acute respiratory failure, cardiac tamponade, mediastinal mass. EXAM: PORTABLE CHEST 1 VIEW COMPARISON:  Portable chest x-ray of January 27, 2015 FINDINGS: The trachea has apparently been extubated. There is linear radiodensity that projects over the lower neck which could be a portion of the endotracheal tube but if so it is positioned quite high. The right lung remains well-expanded and clear. On the left only a small amount of aerated lung persists in the apex and laterally. The left chest tube is stable. The large mediastinal mass is unchanged. There is no pneumothorax nor definite pleural effusion. The esophagogastric tube tip projects below the inferior margin of the image. The right internal jugular venous catheter tip projects over the midportion of the SVC. IMPRESSION: Stable appearance of the chest following extubation of the trachea. Persistent large mediastinal mass. There is no pneumothorax. Electronically Signed   By: David  Martinique M.D.   On: 01/31/2015 07:33   Dg Chest Port 1 View  01/30/2015  CLINICAL DATA:  Persistent fever. Status post pericardial window and mediastinal biopsy. EXAM: PORTABLE CHEST 1 VIEW COMPARISON:  Multiple chest x-rays and chest CT from 01/19/2015 FINDINGS: The need support apparatus is stable. The endotracheal tube is 4.3 cm above the carina. The NG tube is stable. The left-sided chest tube is stable. Persistent large mediastinal mass occupying good portion of the left hemi thorax. Persistent vascular congestion and bibasilar atelectasis. IMPRESSION: Stable support apparatus. Stable left-sided chest tube.  No definite pneumothorax. Stable large mass occupying a good portion of the left hemi thorax. Electronically Signed   By: Marijo Sanes M.D.   On: 01/30/2015 23:30   Dg Chest Port 1 View  01/30/2015  CLINICAL DATA:  Intubated patient, acute respiratory failure and hypoxia, history of  mediastinal mass, pericardial effusion and cardiac tamponade EXAM: PORTABLE CHEST 1 VIEW COMPARISON:  Portable chest x-ray of January 29, 2015. FINDINGS: Stable volume loss on the left with large mediastinal mass. The left-sided chest tubes are unchanged in position. There is no pneumothorax nor large pleural effusion. The right lung is well-expanded. Mild interstitial prominence throughout the right lung is stable. The pulmonary vascularity is not engorged. The endotracheal tube tip lies 3.8 cm above the carina. The right internal jugular venous catheter tip projects over the midportion of the SVC. The esophagogastric tube tip projects below the inferior margin of the image. IMPRESSION: There has been no significant change in the appearance of the chest since yesterday's study. The support tubes are in reasonable position. Electronically Signed   By: David  Martinique M.D.  On: 01/30/2015 07:21   Dg Chest Port 1 View  01/29/2015  CLINICAL DATA:  Shortness of breath, acute respiratory failure, intubated patient, history of mediastinal mass, pericardial effusion, and cardiac tamponade. EXAM: PORTABLE CHEST 1 VIEW COMPARISON:  Portable chest x-ray of January 28, 2015 FINDINGS: The right lung is well-expanded. There is a trace of pleural fluid at the right lung base. On the left the cardiac silhouette occupies much of the hemi thorax. A small amount of aerated lung in the apex and inferior laterally is still demonstrated. The endotracheal tube tip projects approximately 3.7 cm above the carina. The esophagogastric tube tip projects below the inferior margin of the image. A left-sided chest tube has its tip located over the lateral aspect of the approximately 6 rib and is stable. A second tube versus the tip of the esophagogastric tube is noted medially at the left lung base. IMPRESSION: Persistent large mediastinal mass on the left. Slight interval worsening in volume loss on the left. The cardiac silhouette remains  enlarged. No pneumothorax nor large left pleural effusion is observed. The right lung is largely clear. Electronically Signed   By: David  Martinique M.D.   On: 01/29/2015 07:25   Dg Chest Port 1 View  01/28/2015  CLINICAL DATA:  ET tube placement EXAM: PORTABLE CHEST 1 VIEW COMPARISON:  01/27/15 FINDINGS: The ET tube tip is stable above the carina. There is a right IJ catheter with tip in the projection of the SVC. Left-sided chest tube is identified. No pneumothorax. Stable cardiac enlargement and stable large anterior mediastinal mass. No change in small bilateral pleural effusions and pulmonary edema consistent with CHF. IMPRESSION: 1. Left chest tube in place without visible pneumothorax. 2. Large anterior mediastinal mass 3. Bilateral pleural effusions and pulmonary edema. Electronically Signed   By: Kerby Moors M.D.   On: 01/28/2015 08:28   Dg Chest Port 1 View  01/27/2015  CLINICAL DATA:  Pericardial effusion EXAM: PORTABLE CHEST 1 VIEW COMPARISON:  01/24/2015 FINDINGS: ET tube tip is stable above the carina. There is a right IJ catheter with tip in the projection of the SVC. Left chest tube is in place. No pneumothorax. NG tube tip is in the stomach. Cardiac enlargement and large anterior mediastinal mass is again noted. Unchanged from previous exam. Increase in pleural fluid noted bilaterally. IMPRESSION: 1. Stable support apparatus. 2. Left chest tube without pneumothorax. 3. Increase in bilateral pleural effusions. Electronically Signed   By: Kerby Moors M.D.   On: 01/27/2015 09:00   Dg Chest Portable 1 View  02/21/2015  CLINICAL DATA:  Status post pericardial window and Chamberlain procedure for large mediastinal mass with associated pericardial effusion. EXAM: PORTABLE CHEST 1 VIEW COMPARISON:  CT of the chest on 01/19/2015 FINDINGS: Endotracheal tube present with the tip approximately 2.5 cm above the carina. Right jugular central line has been placed with the catheter tip in the SVC.  Pericardial drain and left-sided chest tube present. Nasogastric tube extends into the distal esophagus and does not extend below the diaphragm. No pneumothorax identified. Lung volumes are very low bilaterally. Huge mediastinal mass again visualized which is more prominent to the left of midline. IMPRESSION: 1. No evidence of pneumothorax postoperatively. 2. Low lung volumes. 3. Nasogastric tube tip lies in the distal esophagus. Electronically Signed   By: Aletta Edouard M.D.   On: 01/31/2015 14:30   Dg Abd Portable 1v  02/12/2015  CLINICAL DATA:  Followup distended abdomen. Evaluate NG tube placement. EXAM: PORTABLE ABDOMEN -  1 VIEW COMPARISON:  02/08/2015 FINDINGS: The nasogastric tube tip is in the expected location of the duodenum. Gaseous distension of the large and small bowel loops identified and appear increased from previous exam. IMPRESSION: 1. NG tube tip is in the duodenum. 2. Persistent gaseous distension of bowel loops. Electronically Signed   By: Kerby Moors M.D.   On: 02/12/2015 09:29   Dg Abd Portable 1v  02/08/2015  CLINICAL DATA:  Nausea and vomiting. History of lymphoma and melanoma. EXAM: PORTABLE ABDOMEN - 1 VIEW COMPARISON:  02/05/2015. FINDINGS: Scattered air throughout the small bowel and colon and some residual contrast in the colon. Findings suggest a mild ileus. IMPRESSION: Persistent mild ileus bowel gas pattern. Electronically Signed   By: Marijo Sanes M.D.   On: 02/08/2015 10:09   Dg Abd Portable 1v  02/05/2015  CLINICAL DATA:  NG tube placement. EXAM: PORTABLE ABDOMEN - 1 VIEW COMPARISON:  CT scan 02/01/2015 FINDINGS: The NG tube is in the body region of the stomach. Scattered air in the small bowel and colon along with some contrast in the colon could suggest a mild ileus. No free air. Bibasilar atelectasis. IMPRESSION: NG tube in good position in the body region the stomach. Moderate air throughout the bowel may suggest a diffuse ileus. Electronically Signed   By:  Marijo Sanes M.D.   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 LDH level 950. 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 Cytogenetics and FISH studies pending. Low CD20 positivity which can be seen about 16-20% of diffuse large B-cell lymphoma is and could potentially portend a poor prognosis independent of the IPI index. (Blood 2009 [id number] Hilaria Ota et al.) Cannot r/o a double hit lymphoma - FISH for BCL2 and cMYC pending results. Blood counts stable. On presentation had difficulty with breathing due to airway compression. 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 Decreasing LDH levels suggestive of response to treatment. Patient had better weaning trials today. S/p first cycle of CHOP on 02/02/2015 and Rituxan 02/05/2015. LDH down from 950 to about 400 suggesting good response to treatment thus far but still continues to have multifactorial challenges with weaning.  #2 pancytopenia related to chemotherapy and lymphoma #3 febrile neutropenia on Zosyn and vancomycin.  Receiving daily G-CSF. Plan -continue to monitor cbc daily.  -started on G-CSF on 11/18 [place] daily for neutropenia. Await count recovery. Continue daily till ANC>1000 x 2 days. (Spleen normal in size). -on broad-spectrum antibiotics for neutropenic fever. -We'll discontinue allopurinol in the absence of overt tumor lysis syndrometto allow for improvement in liver functions and avoid additional factors that might cause cytopenias. -ongoing weaning trials as per pulmonary/CCM team -if double hit lymphoma or even based on high risk features might consider EPOCH-R from cycle 2 depending  on how patient does. Cycle 2 is scheduled for 12/2. Patient will need G-CSF support prophylactically from cycle 2. -Will need outpatient PET/CT scan when. -will transfuse 2 units of PRBC in the setting of limited bone marrow and physiologic reserve, tachycardia and significant physiologic stressors.  #2 ?HCAP/ventilator acquired pneumonia - elevated procalcitonin --- improving. Strept in secretions treated with Ceftriaxone - now off antibiotics. ?underlying sleep apnea - given body habitus. Might have lower O2  sats at baseline. Now on broad spectrum antibiotics for febrile neutropenia. Plan -antibiotics/weaning trials and vent management as per critical care team.   #3 hypernatremia -resolved. likely due to free water deficit .sodium levels improving.  Intake/Output Summary (Last 24 hours) at 02/13/15 0552 Last data filed at 02/13/15 0300  Gross per 24 hour  Intake 2504.34 ml  Output    425 ml  Net 2079.34 ml   Fluid and electrolyte management as per critical care team.  #4 Previous history of upper extremity melanoma status post resection couple of years ago. Family notes that she was followed at [facility] with repeat scans that showed no evidence of metastatic disease or recurrence. She was last evaluated about a year ago.  #5 history of rheumatoid arthritis previously on methotrexate  #6 Hyperglycemia due to steroids ?h/o DM2. Improving control off steroids.  #7 LUE swelling - acute DVT noted on [place]- like related to left innomiate venous compression by mediastinal mass and hypercoag state from lymphoma, hospitalization obesity etc. Plan -on iv heparin currently. -would need to transition to Lovenox eventually if renal function stable.  #8 Anasarca due to hypoalbuminemia due to hypercatabolic state in the setting of lymphoma/chemotherapy/acute ICU status. -getting tube feeding. -might need albumin assisted diuresis if fluid overload becomes an issue with weaning.   Plz call if  any additional questions.  Sullivan Lone MD [facility] AAHIVMS [facility] Hematology/Oncology Physician [facility]  (Office):       [phone] (Work cell):  [phone] (Fax):           [phone]

## 2015-02-13 NOTE — Plan of Care (Signed)
Problem: Nutrition: Goal: Adequate nutrition will be maintained Outcome: Progressing Tube feeding makes patient nauseated.  Will increase rate slowly to allow patient time to adjust.

## 2015-02-13 NOTE — Progress Notes (Signed)
Name: Maria Bauer MRN: [id number] DOB: [birth year]    ADMISSION DATE:  01/29/2015  CONSULTATION DATE:  01/24/2015  REFERRING MD :  Dr. 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    SIGNIFICANT EVENTS  10/28 CT chest >> 12 cm anterior mediastinal mass, mod pericardial effusion 11/03 Admit 11/04 To OR for pericardial window and mediastinal mass bx >> remains on vent post-op 11/08 Fever, started Abx 11/09 Off pressors 11/10 Oncology consulted n-11/10 Mediastinal mass bx >> diffuse large B-cell lymphoma 11/11 Trach (JY); transfer to [facility] for chemo (R-CHOP) 11/13  Remains on sedation.  Tolerated pressure support. 11/14  Episode of SVT with suctioning overnight.  Did not tolerated PSV this am due to anxiety. 11/17  Weaned on PSV until 1700 yesterday, vomiting overnight x2.  LUE swollen ([place] pending) 11/17 UE Duplex >> neg RUE, acute DVT involving the internal jugular, subclavian and proximal axillary veins 11/19  Empiric abx restarted in setting of fever with neutropenia 11/20  Weaned 3 hours on ATC 11/22  PRBC for anemia, TF restarted, remains agitated / uncomfortable appearing     SUBJECTIVE:   TF resumed overnight.  PRBC's transfused overnight per ONC, remains agitated / uncomfortable appearing    VITAL SIGNS: BP 164/55 mmHg  Pulse 90  Temp(Src) 98.8 F (37.1 C) (Oral)  Resp 16  Ht 5\' 4"  (1.626 m)  Wt 210 lb 5.1 oz (95.4 kg)  BMI 36.08 kg/m2  SpO2 98%   VENT SETTINGS: Vent Mode:  [-] CPAP FiO2 (%):  [30 %] 30 % Set Rate:  [12 bmp] 12 bmp PEEP:  [5 cmH20] 5 cmH20 Pressure Support:  [15 cmH20] 15 cmH20 Plateau Pressure:  [19 cmH20-22 cmH20] 20 cmH20   INTAKE/OUTPUT: I/O last 3 completed shifts: In: 4066.8 [I.V.:1162.3; Blood:948.8; NG/GT:1005.7; IV Piggyback:950] Out: 1020 [Urine:1020]  PHYSICAL EXAMINATION:  General: elderly female in NAD on  vent, appears uncomfortable  HENT: trach site with mild erythema, large neck, difficult to assess JVD PULM: even/non-labored, diminished lower, otherwise few rhonchi, frequent coughing CV: s1s2 rrr, no m/r/g, ST on monitor, site anterior chest c/d/i GI: soft, non tender MSK: generalized edema, LUE swelling  Neuro: generalized weakness, communicates appropriately with questioning  CBC Recent Labs     02/11/15  0630  02/12/15  0500  02/13/15  0555  WBC  0.1*  0.2*  3.4*  HGB  7.6*  7.1*  10.6*  HCT  24.6*  22.9*  32.5*  PLT  96*  124*  151   Coag's No results for input(s): APTT, INR in the last 72 hours.  BMET Recent Labs     02/11/15  0630  02/12/15  0500  02/13/15  0555  NA  141  143  142  K  2.8*  3.3*  3.5  CL  100*  104  104  CO2  30  28  27   BUN  19  11  18   CREATININE  0.50  0.36*  1.13*  GLUCOSE  101*  91  133*    Electrolytes Recent Labs     02/11/15  0630  02/12/15  0500  02/13/15  0555  CALCIUM  7.5*  7.5*  7.8*   Sepsis Markers No results for input(s): PROCALCITON, O2SATVEN in the last 72 hours.  Invalid input(s): LACTICACIDVEN  ABG No results for input(s): PHART, PCO2ART, PO2ART in the last 72 hours.  Liver Enzymes Recent Labs     02/11/15  0630  02/12/15  0500  02/13/15  0555  AST  14*  14*  17  ALT  26  21  19   ALKPHOS  76  81  192*  BILITOT  2.1*  1.9*  2.2*  ALBUMIN  2.1*  1.8*  2.1*   Glucose Recent Labs     02/12/15  1201  02/12/15  1533  02/12/15  1945  02/12/15  2356  02/13/15  0446  02/13/15  0740  GLUCAP  115*  96  98  116*  124*  123*    Imaging Dg Chest Port 1 View  02/12/2015  CLINICAL DATA:  Acute respiratory failure EXAM: PORTABLE CHEST 1 VIEW COMPARISON:  02/11/2015 FINDINGS: Tracheostomy tube is well seated. Right IJ central line, tip at the SVC level. Orogastric tube tip at the upper stomach. A bandlike opacity in the left perihilar lung has a central lucent area which has variable appearance, likely aerated  lung with neighboring atelectasis rather than a cavity. Mediastinal widening correlating with known mass. No gross change in cardiopericardial size, distorted by leftward rotation, in this patient with recent sub xiphoid window. Interstitial coarsening compatible with pulmonary venous congestion, stable. No pneumothorax. IMPRESSION: 1. Unchanged positioning of tubes and central line. 2. Stable pulmonary venous congestion and left perihilar atelectasis. Electronically Signed   By: Monte Fantasia M.D.   On: 02/12/2015 07:10   Dg Abd Portable 1v  02/12/2015  CLINICAL DATA:  Followup distended abdomen. Evaluate NG tube placement. EXAM: PORTABLE ABDOMEN - 1 VIEW COMPARISON:  02/08/2015 FINDINGS: The nasogastric tube tip is in the expected location of the duodenum. Gaseous distension of the large and small bowel loops identified and appear increased from previous exam. IMPRESSION: 1. NG tube tip is in the duodenum. 2. Persistent gaseous distension of bowel loops. Electronically Signed   By: Kerby Moors M.D.   On: 02/12/2015 09:29    ASSESSMENT / PLAN:  PULMONARY Trach 11/11 >> A: Acute respiratory failure. Failure to wean from vent s/p trach. Thus far has not tolerated PSV, significant contributor is cough leading to asynchrony and vent alarms Concern for aspiration - vomiting overnight 11/17, ? TF suctioned from trach  Persistent cough P: Pressure support wean, to trach collar as tolerated  Pressure control as rest mode  Attempt to get her upright, avoid trach contact with tracheal wall; continued cough appears to be source of much of her discomfort F/u CXR intermittently   CARDIOVASCULAR: Rt IJ CVL 11/04 >> 11/22 R PICC 11/22 (IR) >> A: Malignant pericardial effusion s/p window. Hypotension likely from hypovolemia and sedation >> improved but still labile w sedating meds Hx of HLD, HTN. Sinus tachycardia P: Continue outpatient lopressor, dose decreased 11/19.  Pending placement of  PICC line per IR Continue zocor  Monitor hemodynamics Monitor incision sites / drainage  RENAL A:  AKI - sr cr increase 11/22 Hypokalemia. Hypernatremia. P: Replace electrolytes as indicated Continue free water, 250 Q6 Increase D5 1/2NS to 50 ml/hr with AKI Follow I/O   GASTROENTEROLOGY:  A:  Nutrition. Gastroparesis. Mild Ileus - noted on KUB 11/17 Nausea / Vomiting - new, 11/17. Coffee grounds from NGT 11/19 P:  TF restarted PM 11/22 Monitor abdominal exam Zantac for SUP PRN zofran for nausea / vomiting   HEMATOLOGY/ONCOLOGY: A: B cell lymphoma. Anemia of critical illness - s/p transfusion 11/22. LUE Swelling - positive for DVT 11/17 in L IJ, L [place], L axillary veins  Neutropenia - new 11/18 Thrombocytopenia  P: Chemotherapy (R-CHOP) >> completed 1st cycle 11/11, Rituxan 11/14 Previously used solu-medrol in place of prednisone, completed ONC notes reflect high dose steroid need completed 11/15.   Allopurinol for tumor lysis syndrome prophylaxis F/u CBC Heparin gtt for DVT  Monitor Hgb drift - has decreased from 9 to 7.1  ID:  A: Tracheobronchitis. Neutropenic Fever P: Treated w ceftriaxone thru 11/16  Blood 11/09 >> neg Sputum 11/09 >> Moderate Streptococcus, beta hemolytic Blood 11/18 >> Coag neg staph 1/2 >>  C-Diff 11/19 >> neg  Vanco 11/19 >>  Zosyn 11/19 >>   Started empiric abx on 11/19 given febrile neutropenia Fever curve improved 11/22 Nystatin powder to perineal area D/C TLC, place PICC (will have to be RUE due to clot in LUE)  ENDOCRINE: A: Steroid induced hyperglycemia. P: SSI  Continue to hold Levemir, was previously on 25 units with TF running.  Monitor SSI usage for now  NEUROLOGY:  A:  Agitation / ? Delirium ICU / Cancer Associated Pain Anxiety P: RASS goal 0 PRN fentanyl for pain  Fentanyl patch added 11/18 by oncology Seroquel 25 mg BID Klonopin 0.5 mg BID added 11/21  PRN ativan for agitation  Agitation / pain has  been difficult to manage due to hypotension from meds. Will attempt to balance. Ordered fentanyl gtt on 11/19 but unable to tolerate due to hypotension  Trial one time dose of haldol - ? Agitated delirium. If responsive, consider d/c seroquel.  QTc 0.43   RHEUMATOLOGY: A: Hx of RA >> was on MTX as outpt. P: Will need to re-assess as outpt     Noe Gens, NP-C [facility] Pgr: [phone] or if no answer [phone] 02/13/2015, 8:15 AM

## 2015-02-13 NOTE — Progress Notes (Signed)
ANTICOAGULATION CONSULT NOTE - Follow Up Consult  Pharmacy Consult for heparin Indication: DVT  Allergies  Allergen Reactions  . Morphine And Related Hives and Itching  . Norco [Hydrocodone-Acetaminophen] Hives and Itching   Patient Measurements: Height: 5\' 4"  (162.6 cm) Weight: 210 lb 5.1 oz (95.4 kg) IBW/kg (Calculated) : 54.7 Heparin Dosing Weight: 77kg  Vital Signs: Temp: 99.5 F (37.5 C) (11/22 0500) Temp Source: Axillary (11/22 0500) BP: 164/55 mmHg (11/22 0620) Pulse Rate: 90 (11/22 0620)  Labs:  Recent Labs  02/11/15 0630  02/12/15 0500 02/12/15 1125 02/12/15 1945 02/13/15 0555  HGB 7.6*  --  7.1*  --   --  10.6*  HCT 24.6*  --  22.9*  --   --  32.5*  PLT 96*  --  124*  --   --  151  HEPARINUNFRC  --   < >  --  0.18* 0.62 0.78*  CREATININE 0.50  --  0.36*  --   --  1.13*  < > = values in this interval not displayed. Estimated Creatinine Clearance: 51.9 mL/min (by C-G formula based on Cr of 1.13).  Assessment: Maria Bauer with large mediastinal mass and pericardial effusion, s/p pericardial window 11/4.  Biopsy revealed B-cell lymphoma s/p 1st cycle of R-CHOP.  She remains on ventilator with trach.  Noted to have swollen LUE 11/16. UE dopplers reveal DVT in internal jugular, subclavian, and proximal axillary veins. Enoxaparin 40mg  given at 10am today. RN noted large bruise on hip, no change with start of Heparin.  Today, 02/13/2015:   Heparin level now SUPRAtherapeutic (0.78) with rate increased to 2400 units/hr.  Reviewed with RN - rate correct at 20.5 mL/hr, no interruptions in the infusion noted.  Hgb improved to 10.6 after transfusion yesterday.  Pltc low but improving.  No overt bleeding reported.  Note SCr significantly increased (0.36 >> 1.13) overnight.   Goal of Therapy:  Heparin level 0.3-0.7 units/ml Monitor platelets by anticoagulation protocol: Yes   Plan:   Decreasese heparin rate to 2200 units/hr  Recheck heparin level in 8  hrs  Follow heparin level, H/H, and platelet count daily  Monitor for evidence of bleeding  Follow clinical course.  Peggyann Juba, PharmD, BCPS Pager: [phone]  02/13/2015  7:19 AM

## 2015-02-13 NOTE — Progress Notes (Signed)
ANTICOAGULATION CONSULT NOTE - Brief Follow Up  Pharmacy Consult for heparin Indication: DVT  Please see pharmacist note from earlier today for further detail.  Heparin level now therapeutic at 0.47 after reducing rate to 2200 units/hr.    Plan:  Continue heparin at 2200 units/hr. Recheck heparin level in 8 hours to confirm rate.  Hershal Coria, PharmD, BCPS Pager: [phone] 02/13/2015 5:05 PM

## 2015-02-13 NOTE — Progress Notes (Signed)
Late Entry. Wasted 150cc of Fentanyl in sink. Witnessed by Cay Schillings, RN

## 2015-02-14 ENCOUNTER — Telehealth (HOSPITAL_COMMUNITY): Payer: Self-pay | Admitting: *Deleted

## 2015-02-14 ENCOUNTER — Inpatient Hospital Stay (HOSPITAL_COMMUNITY): Payer: Medicare (Managed Care)

## 2015-02-14 DIAGNOSIS — R4182 Altered mental status, unspecified: Secondary | ICD-10-CM

## 2015-02-14 DIAGNOSIS — Z66 Do not resuscitate: Secondary | ICD-10-CM

## 2015-02-14 DIAGNOSIS — R578 Other shock: Secondary | ICD-10-CM

## 2015-02-14 LAB — CBC WITH DIFFERENTIAL/PLATELET
BASOS ABS: 0.1 10*3/uL (ref 0.0–0.1)
BLASTS: 0 %
Band Neutrophils: 0 %
Basophils Relative: 1 %
EOS PCT: 0 %
Eosinophils Absolute: 0 10*3/uL (ref 0.0–0.7)
HEMATOCRIT: 32.7 % — AB (ref 36.0–46.0)
HEMOGLOBIN: 10.6 g/dL — AB (ref 12.0–15.0)
LYMPHS ABS: 1.2 10*3/uL (ref 0.7–4.0)
Lymphocytes Relative: 11 %
MCH: 30 pg (ref 26.0–34.0)
MCHC: 32.4 g/dL (ref 30.0–36.0)
MCV: 92.6 fL (ref 78.0–100.0)
MYELOCYTES: 0 %
Metamyelocytes Relative: 0 %
Monocytes Absolute: 0.2 10*3/uL (ref 0.1–1.0)
Monocytes Relative: 2 %
NEUTROS PCT: 86 %
NRBC: 2 /100{WBCs} — AB
Neutro Abs: 9.7 10*3/uL — ABNORMAL HIGH (ref 1.7–7.7)
Other: 0 %
Platelets: 152 10*3/uL (ref 150–400)
Promyelocytes Absolute: 0 %
RBC: 3.53 MIL/uL — AB (ref 3.87–5.11)
RDW: 16.3 % — ABNORMAL HIGH (ref 11.5–15.5)
WBC: 11.2 10*3/uL — AB (ref 4.0–10.5)

## 2015-02-14 LAB — GLUCOSE, CAPILLARY
GLUCOSE-CAPILLARY: 118 mg/dL — AB (ref 65–99)
GLUCOSE-CAPILLARY: 131 mg/dL — AB (ref 65–99)
GLUCOSE-CAPILLARY: 139 mg/dL — AB (ref 65–99)
GLUCOSE-CAPILLARY: 141 mg/dL — AB (ref 65–99)
Glucose-Capillary: 156 mg/dL — ABNORMAL HIGH (ref 65–99)

## 2015-02-14 LAB — COMPREHENSIVE METABOLIC PANEL
ALK PHOS: 263 U/L — AB (ref 38–126)
ALT: 19 U/L (ref 14–54)
AST: 21 U/L (ref 15–41)
Albumin: 2.1 g/dL — ABNORMAL LOW (ref 3.5–5.0)
Anion gap: 10 (ref 5–15)
BILIRUBIN TOTAL: 2.1 mg/dL — AB (ref 0.3–1.2)
BUN: 22 mg/dL — AB (ref 6–20)
CO2: 24 mmol/L (ref 22–32)
CREATININE: 1.48 mg/dL — AB (ref 0.44–1.00)
Calcium: 7.7 mg/dL — ABNORMAL LOW (ref 8.9–10.3)
Chloride: 106 mmol/L (ref 101–111)
GFR, EST AFRICAN AMERICAN: 40 mL/min — AB (ref >60)
GFR, EST NON AFRICAN AMERICAN: 35 mL/min — AB (ref >60)
Glucose, Bld: 147 mg/dL — ABNORMAL HIGH (ref 65–99)
Potassium: 3.9 mmol/L (ref 3.5–5.1)
Sodium: 140 mmol/L (ref 135–145)
TOTAL PROTEIN: 5.3 g/dL — AB (ref 6.5–8.1)

## 2015-02-14 LAB — URIC ACID: Uric Acid, Serum: 3.1 mg/dL (ref 2.3–6.6)

## 2015-02-14 LAB — LACTIC ACID, PLASMA: Lactic Acid, Venous: 2.8 mmol/L (ref 0.5–2.0)

## 2015-02-14 LAB — CULTURE, BLOOD (ROUTINE X 2): CULTURE: NO GROWTH

## 2015-02-14 LAB — HEPARIN LEVEL (UNFRACTIONATED)
HEPARIN UNFRACTIONATED: 0.53 [IU]/mL (ref 0.30–0.70)
Heparin Unfractionated: 0.64 IU/mL (ref 0.30–0.70)

## 2015-02-14 LAB — VANCOMYCIN, RANDOM: VANCOMYCIN RM: 23 ug/mL

## 2015-02-14 LAB — CBC
HEMATOCRIT: 23.8 % — AB (ref 36.0–46.0)
HEMOGLOBIN: 7.7 g/dL — AB (ref 12.0–15.0)
MCH: 30.2 pg (ref 26.0–34.0)
MCHC: 32.4 g/dL (ref 30.0–36.0)
MCV: 93.3 fL (ref 78.0–100.0)
Platelets: 151 10*3/uL (ref 150–400)
RBC: 2.55 MIL/uL — AB (ref 3.87–5.11)
RDW: 16.3 % — ABNORMAL HIGH (ref 11.5–15.5)
WBC: 19.8 10*3/uL — AB (ref 4.0–10.5)

## 2015-02-14 MED ORDER — HYDROMORPHONE HCL 1 MG/ML IJ SOLN
0.5000 mg | Freq: Once | INTRAMUSCULAR | Status: AC
  Administered 2015-02-14: 0.5 mg via INTRAVENOUS
  Filled 2015-02-14: qty 1

## 2015-02-14 MED ORDER — NOREPINEPHRINE BITARTRATE 1 MG/ML IV SOLN
2.0000 ug/min | INTRAVENOUS | Status: DC
  Administered 2015-02-14: 10 ug/min via INTRAVENOUS
  Filled 2015-02-14 (×2): qty 4

## 2015-02-14 MED ORDER — FENTANYL CITRATE (PF) 100 MCG/2ML IJ SOLN
50.0000 ug | Freq: Once | INTRAMUSCULAR | Status: AC
  Administered 2015-02-14: 50 ug via INTRAVENOUS

## 2015-02-14 MED ORDER — SODIUM CHLORIDE 0.9 % IJ SOLN: 10.0000 mL | INTRAMUSCULAR | Status: DC | PRN

## 2015-02-14 MED ORDER — MIDAZOLAM HCL 2 MG/2ML IJ SOLN: 1.0000 mg | INTRAMUSCULAR | Status: DC | PRN

## 2015-02-14 MED ORDER — SODIUM CHLORIDE 0.9 % IV SOLN: Freq: Once | INTRAVENOUS | Status: DC

## 2015-02-14 MED ORDER — SODIUM CHLORIDE 0.9 % IV BOLUS (SEPSIS): 500.0000 mL | INTRAVENOUS | Status: AC

## 2015-02-14 MED ORDER — PROTAMINE SULFATE 10 MG/ML IV SOLN
25.0000 mg | Freq: Once | INTRAVENOUS | Status: AC
  Administered 2015-02-14: 25 mg via INTRAVENOUS
  Filled 2015-02-14: qty 2.5

## 2015-02-14 MED ORDER — SODIUM CHLORIDE 0.9 % IV SOLN
25.0000 ug/h | INTRAVENOUS | Status: DC
  Administered 2015-02-14: 75 ug/h via INTRAVENOUS
  Filled 2015-02-14: qty 50

## 2015-02-14 MED ORDER — LIDOCAINE HCL 1 % IJ SOLN
INTRAMUSCULAR | Status: AC
  Filled 2015-02-14: qty 20

## 2015-02-14 MED ORDER — SODIUM CHLORIDE 0.9 % IJ SOLN: 10.0000 mL | Freq: Two times a day (BID) | INTRAMUSCULAR | Status: DC

## 2015-02-14 MED ORDER — HALOPERIDOL LACTATE 5 MG/ML IJ SOLN
2.0000 mg | Freq: Four times a day (QID) | INTRAMUSCULAR | Status: DC
  Administered 2015-02-14: 2 mg via INTRAVENOUS
  Filled 2015-02-14: qty 1

## 2015-02-14 MED ORDER — FENTANYL CITRATE (PF) 100 MCG/2ML IJ SOLN
12.5000 ug | INTRAMUSCULAR | Status: DC | PRN
  Administered 2015-02-14: 25 ug via INTRAVENOUS
  Administered 2015-02-14: 50 ug via INTRAVENOUS
  Filled 2015-02-14: qty 2

## 2015-02-14 MED ORDER — FENTANYL BOLUS VIA INFUSION
50.0000 ug | INTRAVENOUS | Status: DC | PRN
  Filled 2015-02-14: qty 50

## 2015-02-15 LAB — TYPE AND SCREEN
ABO/RH(D): A POS
ANTIBODY SCREEN: NEGATIVE
UNIT DIVISION: 0
Unit division: 0
Unit division: 0
Unit division: 0

## 2015-02-19 ENCOUNTER — Telehealth: Payer: Self-pay

## 2015-02-19 NOTE — Telephone Encounter (Signed)
On 02/19/2015 I received a death certificate from [employer] and [employer]. The death certificate is for cremation. The patient is a patient of Doctor Ramaswamy. The death certificate will be taken to the [facility] (2100) this pm for signature.  On 14-Mar-2015 I received the death certificate back from Doctor Ramaswamy. I got the death certificate ready and faxed a copy per their request and they are going to pickup the original.

## 2015-02-22 NOTE — Procedures (Signed)
Successful RUE TL PICC TIP SVC/RA NO COMP READY FOR USE FULL REPORT IN PACS

## 2015-02-22 NOTE — Progress Notes (Signed)
ANTICOAGULATION CONSULT NOTE - Follow Up Consult  Pharmacy Consult for heparin Indication: DVT  Allergies  Allergen Reactions  . Morphine And Related Hives and Itching  . Norco [Hydrocodone-Acetaminophen] Hives and Itching   Patient Measurements: Height: 5\' 4"  (162.6 cm) Weight: 210 lb 5.1 oz (95.4 kg) IBW/kg (Calculated) : 54.7 Heparin Dosing Weight: 77kg  Vital Signs: Temp: 98.4 F (36.9 C) (11/23 0800) Temp Source: Oral (11/23 0800) BP: 145/97 mmHg (11/23 0700) Pulse Rate: 122 (11/23 0700)  Labs:  Recent Labs  02/12/15 0500  02/13/15 0555 02/13/15 1605 2015-03-09 0020 2015-03-09 0320 2015-03-09 0757  HGB 7.1*  --  10.6*  --   --  10.6*  --   HCT 22.9*  --  32.5*  --   --  32.7*  --   PLT 124*  --  151  --   --  152  --   HEPARINUNFRC  --   < > 0.78* 0.47 0.64  --  0.53  CREATININE 0.36*  --  1.13*  --   --  1.48*  --   < > = values in this interval not displayed. Estimated Creatinine Clearance: 39.6 mL/min (by C-G formula based on Cr of 1.48).  Assessment: 29 YOF with large mediastinal mass and pericardial effusion, s/p pericardial window 11/4.  Biopsy revealed B-cell lymphoma s/p 1st cycle of R-CHOP.  She remains on ventilator with trach.  Noted to have swollen LUE 11/16. UE dopplers reveal DVT in internal jugular, subclavian, and proximal axillary veins. Pharmacy consulted to dose IV heparin.  Today, 03/09/15:   Heparin level therapeutic (0.53) on 2200 units/hr.  Reviewed with RN - rate correct at 22 mL/hr, no interruptions in the infusion noted.  Hgb improved to 10.6 after transfusion 11/21.  Pltc low but stable.  No overt bleeding reported.  Note SCr significantly increasing (0.36 >> 1.13 >> 1.48).   Goal of Therapy:  Heparin level 0.3-0.7 units/ml Monitor platelets by anticoagulation protocol: Yes   Plan:   Continue heparin at 2200 units/hr  Daily heparin level, CBC  Monitor for evidence of bleeding  Follow clinical course.  Peggyann Juba, PharmD, BCPS Pager: [phone]  March 09, 2015  8:46 AM

## 2015-02-22 NOTE — Progress Notes (Signed)
Spiritual Care paged at [phone].   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 She died at peace spiritually, with her family members surrounding the bed. As she was dying the chaplain whispered for her to smile when she saw the angels coming. She did briefly before she drew her last breath. Her husband spoke of her enduring faith and her being prepared for her death. He was pleased that she did not suffer longer. Family stated their thanks for the supportive and loving staff.  Interventions: Spiritual care should be paged if there are any who may need or wish emotional or spiritual support.  Sallee Lange. Zeniyah Peaster, DMin, MDiv, MA Chaplain

## 2015-02-22 NOTE — Progress Notes (Signed)
[place] Progress Note Patient Name: Amariss Holmstrom DOB: 01/07/1946 MRN: [id number]   Date of Service  2015-03-16  HPI/Events of Note  Rt arm flaccid.  eICU Interventions  CT head     Intervention Category Intermediate Interventions: Change in mental status - evaluation and management  Vincie Linn 16-Mar-2015, 12:55 AM

## 2015-02-22 NOTE — Progress Notes (Signed)
[facility]   HEMATOLOGY/ONCOLOGY INPATIENT PROGRESS NOTE  Date of Service: 02/13/2015 Inpatient Attending: .Brand Males, MD   SUBJECTIVE  Maria Bauer remains underwent. Had a PICC line placed. Neutropenia has resolved and her Neupogen has been held. Renal function creeping up in the setting of elevated vancomycin trough's. No high-grade fever spikes. Continues to remain on antibiotics. Has been having bowel movements as per our and but is noted to have some abdominal discomfort.  OBJECTIVE:  PHYSICAL EXAMINATION: . Filed Vitals:   03/16/15 0500 16-Mar-2015 0604 2015/03/16 0700 03/16/15 0800  BP: 152/83 141/73 145/97   Pulse: 125 101 122   Temp:    98.4 F (36.9 C)  TempSrc:    Oral  Resp: 31 18 19    Height:      Weight:      SpO2: 100% 99% 97%    Filed Weights   02/03/15 0319 02/04/15 1600 02/10/15 0500  Weight: 203 lb 11.3 oz (92.4 kg) 212 lb 8.4 oz (96.4 kg) 210 lb 5.1 oz (95.4 kg)   .Body mass index is 36.08 kg/(m^2).  GENERAL no acute distress, trach in situ, intermittent issues with agitation SKIN: no acute rashes EYES: resting comfortably been seen OROPHARYNX:NG tube in situ NECK: supple, thyroid normal size, non-tender, without nodularity LYMPH: no palpable lymphadenopathy in the cervical, axillary or inguinal area LUNGS: Distant breath breath sounds, no overt rhonchi or rales. HEART: regular rate & rhythm and no murmurs and trace pedal edema ABDOMEN:soft, non-tender and normal bowel sounds, no hepatosplenomegaly. NEURO: appears awake and alert. EXT- b/l Upper extremity swelling LL>Rt   ALLERGIES:  is allergic to morphine and related and norco.  MEDICATIONS:  Scheduled Meds: . sodium chloride  250 mL Intravenous Once  . antiseptic oral rinse  7 mL Mouth Rinse QID  . bisacodyl  10 mg Oral Daily  . chlorhexidine gluconate  15 mL Mouth Rinse BID  . clonazePAM  0.5 mg Oral BID  . fentaNYL  25 mcg Transdermal Q72H  . free water  250 mL Per Tube Q6H  . haloperidol  lactate  2 mg Intravenous Q6H  . haloperidol lactate  5 mg Intravenous Q6H  . insulin aspart  0-20 Units Subcutaneous 6 times per day  . lidocaine      . metoprolol tartrate  37.5 mg Oral BID  . nystatin   Topical BID  . piperacillin-tazobactam (ZOSYN)  IV  3.375 g Intravenous Q8H  . ranitidine  150 mg Oral BID  . senna-docusate  2 tablet Oral QHS   Continuous Infusions: . dextrose 5 % and 0.45% NaCl 10 mL/hr at 03-16-15 0700  . feeding supplement (VITAL HIGH PROTEIN) 1,000 mL (March 16, 2015 0700)  . heparin 2,200 Units/hr (March 16, 2015 0700)   PRN Meds:.acetaminophen, alteplase, diphenhydrAMINE, fentaNYL (SUBLIMAZE) injection, heparin lock flush, heparin lock flush, heparin lock flush, heparin lock flush, LORazepam, ondansetron (ZOFRAN) IV, sodium chloride, sodium chloride, sodium chloride, sodium chloride  REVIEW OF SYSTEMS:    10 Point review of Systems was done is negative except as noted above.   LABORATORY DATA:  I have reviewed the data as listed  . CBC Latest Ref Rng 2015/03/16 02/13/2015 02/12/2015  WBC 4.0 - 10.5 K/uL 11.2(H) 3.4(L) 0.2(LL)  Hemoglobin 12.0 - 15.0 g/dL 10.6(L) 10.6(L) 7.1(L)  Hematocrit 36.0 - 46.0 % 32.7(L) 32.5(L) 22.9(L)  Platelets 150 - 400 K/uL 152 151 124(L)    . CMP Latest Ref Rng 03-16-15 02/13/2015 02/12/2015  Glucose 65 - 99 mg/dL 147(H) 133(H) 91  BUN 6 - 20  mg/dL 22(H) 18 11  Creatinine 0.44 - 1.00 mg/dL 1.48(H) 1.13(H) 0.36(L)  Sodium 135 - 145 mmol/L 140 142 143  Potassium 3.5 - 5.1 mmol/L 3.9 3.5 3.3(L)  Chloride 101 - 111 mmol/L 106 104 104  CO2 22 - 32 mmol/L 24 27 28   Calcium 8.9 - 10.3 mg/dL 7.7(L) 7.8(L) 7.5(L)  Total Protein 6.5 - 8.1 g/dL 5.3(L) 5.1(L) 4.9(L)  Total Bilirubin 0.3 - 1.2 mg/dL 2.1(H) 2.2(H) 1.9(H)  Alkaline Phos 38 - 126 U/L 263(H) 192(H) 81  AST 15 - 41 U/L 21 17 14(L)  ALT 14 - 54 U/L 19 19 21    . Lab Results  Component Value Date   LDH 305* 02/13/2015     RADIOGRAPHIC STUDIES: I have personally  reviewed the radiological images as listed and agreed with the findings in the report. Ct Abdomen Pelvis Wo Contrast  02/01/2015  CLINICAL DATA:  Diffuse large B-cell lymphoma undergoing chemotherapy. EXAM: CT ABDOMEN AND PELVIS WITHOUT CONTRAST TECHNIQUE: Multidetector CT imaging of the abdomen and pelvis was performed following the standard protocol without IV contrast. COMPARISON:  Recent chest CT 01/19/2015 FINDINGS: Examination is degraded by motion. Lower chest: There is air in the left chest wall likely from recent biopsy/surgery. The left-sided chest tube is in place. Extensive left lower lobe airspace consolidation which could reflect atelectasis or pneumonia. Small bilateral pleural effusions. Right basilar atelectasis. There is an NG tube coursing down the esophagus and into the stomach. Bulky mediastinal lymphadenopathy again demonstrated. Coronary artery calcifications are noted. Hepatobiliary: No focal hepatic lesions except for a small cyst in the right hepatic lobe inferiorly. No evidence of metastatic disease without IV contrast. The gallbladder is grossly normal. No common bile duct dilatation. Pancreas: No mass, inflammation or ductal dilatation. Spleen: Normal size.  No definite lesions. Adrenals/Urinary Tract: The adrenal glands are normal. Both kidneys are normal except for a right renal calculus. No hydronephrosis. Stomach/Bowel: The stomach contains an NG tube. The duodenum, small bowel and colon are unremarkable. No inflammatory changes, mass lesions or obstructive findings. A rectal catheter is in place. Vascular/Lymphatic: No mesenteric or retroperitoneal mass or adenopathy. Small scattered lymph nodes are noted. There are moderate to advanced aortic calcifications but no focal aneurysm. Other: No pelvic mass or adenopathy. No free pelvic fluid collections. A Foley catheter is noted in the bladder. The uterus is surgically absent. No inguinal mass or adenopathy. Musculoskeletal: No  significant bony findings. Degenerative changes involving the spine with severe facet disease. There is a remote appearing compression fracture of L1. IMPRESSION: 1. Left-sided chest tube in place with a small left pleural effusion. Significant left lower lobe airspace consolidation suggesting pneumonia or dense atelectasis. 2. Bulky mediastinal lymphadenopathy. 3. No adenopathy in the abdomen or pelvis. 4. No acute abdominal/pelvic findings. Electronically Signed   By: Marijo Sanes M.D.   On: 02/01/2015 21:33   Dg Chest 2 View  01/17/2015  CLINICAL DATA:  Cough and shortness of breath for the past several days EXAM: CHEST  2 VIEW COMPARISON:  None in PACs FINDINGS: There is an abnormal soft tissue mass arising from the left hilar and suprahilar regions adjacent to the AP window. The aortic knob remains reasonably well demonstrated. The cardiac silhouette is enlarged. The pulmonary vascularity is not engorged. The lungs are well-expanded. There is no focal infiltrate nor significant pleural effusion. There is moderate dextrocurvature of the thoracic spine centered at approximately T8. IMPRESSION: Large abnormal anterior mediastinal mass. This may be neoplastic or vascular in nature.  CT scanning of the chest now is recommended. These results will be called to the ordering clinician or representative by the Radiologist Assistant, and communication documented in the PACS or zVision Dashboard. Electronically Signed   By: David  Martinique M.D.   On: 01/17/2015 11:15   Ct Head Wo Contrast  03/01/15  CLINICAL DATA:  Decreased responsiveness. Not using right arm. First noted at 0024 hours.Code Stroke. EXAM: CT HEAD WITHOUT CONTRAST TECHNIQUE: Contiguous axial images were obtained from the base of the skull through the vertex without intravenous contrast. COMPARISON:  None. FINDINGS: Examination is somewhat limited due to motion artifact despite repeat imaging. Mild cerebral atrophy. No ventricular dilatation. Mild  periventricular low-attenuation changes suggesting small vessel ischemia. No mass effect or midline shift. No abnormal extra-axial fluid collections. Gray-white matter junctions are distinct. Basal cisterns are not effaced. No evidence of acute intracranial hemorrhage. No depressed skull fractures. Mild mucosal thickening in the paranasal sinuses. Vascular calcifications. IMPRESSION: No acute intracranial abnormalities. Mild chronic atrophy and small vessel ischemic changes. These results were called by telephone at the time of interpretation on 03/01/15 at 1:40 am to [facility], the patient's nurse on the ICU, who verbally acknowledged these results. Electronically Signed   By: Lucienne Capers M.D.   On: 03/01/15 01:41   Ct Chest W Contrast  01/19/2015  CLINICAL DATA:  Cough, shortness of breath, possible chest mass. EXAM: CT CHEST WITH CONTRAST TECHNIQUE: Multidetector CT imaging of the chest was performed during intravenous contrast administration. CONTRAST:  69mL ISOVUE-300 IOPAMIDOL (ISOVUE-300) INJECTION 61% COMPARISON:  January 17, 2015. FINDINGS: No pneumothorax or pleural effusion is noted. 5 mm nodule is noted posteriorly in the right lower lobe best seen on image number 27 of series 4. Atherosclerosis of thoracic aorta is noted without aneurysm or dissection. Great vessels are widely patent. Mild coronary artery calcifications are noted. Moderate pericardial effusion is noted. Visualized portion of upper abdomen is unremarkable. Abnormal soft tissue density is noted in the anterior mediastinum that measures 12.0 x 8.8 cm. This is noted to compress a portion of the left innominate vein. This is most consistent with adenopathy consistent with lymphoma or metastatic disease. No significant osseous abnormality is noted. IMPRESSION: 12.0 x 8.8 cm abnormal soft tissue density seen in anterior mediastinum concerning for adenopathy related to lymphoma or other metastatic disease. Some compression of the  left innominate vein is noted as a result. Moderate pericardial effusion is noted as well. 5 mm nodule is noted posteriorly in the right lower lobe. This may represent metastatic focus given above finding. Alternatively, it may be unrelated. Continued CT follow-up is recommended to ensure stability. These results will be called to the ordering clinician or representative by the Radiologist Assistant, and communication documented in the PACS or zVision Dashboard. Electronically Signed   By: Marijo Conception, M.D.   On: 01/19/2015 12:07   Ir Fluoro Guide Cv Line Right  2015/03/01  CLINICAL DATA:  SEPSIS, RESPIRATORY FAILURE EXAM: RIGHT UPPER EXTREMITY TRIPLE LUMEN POWER PICC LINE PLACEMENT WITH ULTRASOUND AND FLUOROSCOPIC GUIDANCE FLUOROSCOPY TIME:  12 SECONDS PROCEDURE: The patient was advised of the possible risks andcomplications and agreed to undergo the procedure. The patient was then brought to the angiographic suite for the procedure. The RIGHT arm was prepped with chlorhexidine, drapedin the usual sterile fashion using maximum barrier technique (cap and mask, sterile gown, sterile gloves, large sterile sheet, hand hygiene and cutaneous antisepsis) and infiltrated locally with 1% Lidocaine. Ultrasound demonstrated patency of the right basilic vein,  and this was documented with an image. Under real-time ultrasound guidance, this vein was accessed with a 21 gauge micropuncture needle and image documentation was performed. A 0.018 wire was introduced in to the vein. Over this, a 6 [place] triple lumen power PICC was advanced to the lower SVC/right atrial junction. Fluoroscopy during the procedure and fluoro spot radiograph confirms appropriate catheter position. The catheter was flushed and covered with asterile dressing. Catheter length: 42 Complications: None immediate IMPRESSION: Successful right arm triple lumen power PICC line placement with ultrasound and fluoroscopic guidance. The catheter is ready for  use. Electronically Signed   By: Jerilynn Mages.  Shick M.D.   On: 03/08/15 09:17   Ir US Guide Vasc Access Right  2015/03/08  CLINICAL DATA:  SEPSIS, RESPIRATORY FAILURE EXAM: RIGHT UPPER EXTREMITY TRIPLE LUMEN POWER PICC LINE PLACEMENT WITH ULTRASOUND AND FLUOROSCOPIC GUIDANCE FLUOROSCOPY TIME:  12 SECONDS PROCEDURE: The patient was advised of the possible risks andcomplications and agreed to undergo the procedure. The patient was then brought to the angiographic suite for the procedure. The RIGHT arm was prepped with chlorhexidine, drapedin the usual sterile fashion using maximum barrier technique (cap and mask, sterile gown, sterile gloves, large sterile sheet, hand hygiene and cutaneous antisepsis) and infiltrated locally with 1% Lidocaine. Ultrasound demonstrated patency of the right basilic vein, and this was documented with an image. Under real-time ultrasound guidance, this vein was accessed with a 21 gauge micropuncture needle and image documentation was performed. A 0.018 wire was introduced in to the vein. Over this, a 6 [place] triple lumen power PICC was advanced to the lower SVC/right atrial junction. Fluoroscopy during the procedure and fluoro spot radiograph confirms appropriate catheter position. The catheter was flushed and covered with asterile dressing. Catheter length: 42 Complications: None immediate IMPRESSION: Successful right arm triple lumen power PICC line placement with ultrasound and fluoroscopic guidance. The catheter is ready for use. Electronically Signed   By: Jerilynn Mages.  Shick M.D.   On: 2015/03/08 09:17   Dg Chest Port 1 View  02/13/2015  CLINICAL DATA:  Hx of respiratory failure/distress. Pt has a trach in place and is on a vent. Diffuse large B cell lymphoma, HTN, asthma. Chronic SOB. Pt is not verbally responsive and was not able to be woken up for exam. EXAM: PORTABLE CHEST 1 VIEW COMPARISON:  02/12/2015 FINDINGS: Area of opacity in the left mid lung with associated ppd and left hilar  region fullness is stable. Vascular prominence interstitial thickening is also unchanged. There are no new areas of lung opacity. No pneumothorax. Tracheostomy tube, orogastric tube and right internal jugular central venous catheter are stable. IMPRESSION: 1. No change from the previous day's study. 2. Stable pulmonary venous congestion and probable left perihilar atelectasis associated with the known mass. 3. Support apparatus is stable and well positioned. Electronically Signed   By: Lajean Manes M.D.   On: 02/13/2015 10:43   Dg Chest Port 1 View  02/12/2015  CLINICAL DATA:  Acute respiratory failure EXAM: PORTABLE CHEST 1 VIEW COMPARISON:  02/11/2015 FINDINGS: Tracheostomy tube is well seated. Right IJ central line, tip at the SVC level. Orogastric tube tip at the upper stomach. A bandlike opacity in the left perihilar lung has a central lucent area which has variable appearance, likely aerated lung with neighboring atelectasis rather than a cavity. Mediastinal widening correlating with known mass. No gross change in cardiopericardial size, distorted by leftward rotation, in this patient with recent sub xiphoid window. Interstitial coarsening compatible with pulmonary venous congestion, stable.  No pneumothorax. IMPRESSION: 1. Unchanged positioning of tubes and central line. 2. Stable pulmonary venous congestion and left perihilar atelectasis. Electronically Signed   By: Monte Fantasia M.D.   On: 02/12/2015 07:10   Dg Chest Port 1 View  02/11/2015  CLINICAL DATA:  Acute respiratory failure EXAM: PORTABLE CHEST 1 VIEW COMPARISON:  02/10/2015 FINDINGS: Tracheostomy in good position. NG tube in the proximal stomach and unchanged Progression of left lower lobe airspace disease. Elevated left hemidiaphragm again noted. Possible small left pleural effusion. Right lung remains clear IMPRESSION: Progression of left lower lobe atelectasis/ infiltrate with elevated left hemidiaphragm. Electronically Signed   By:  Franchot Gallo M.D.   On: 02/11/2015 09:14   Dg Chest Port 1 View  02/10/2015  CLINICAL DATA:  Acute respiratory failure.  Subsequent encounter. EXAM: PORTABLE CHEST 1 VIEW COMPARISON:  02/08/2015 FINDINGS: Linear and hazy airspace lung opacity projects from the left hilum to the left mid and upper lung and left lung base, similar to the previous day's study. There is diffuse interstitial thickening bilaterally, also stable. No pneumothorax. Tracheostomy tube, right internal jugular central venous line and orogastric tube are well positioned, without significant change. IMPRESSION: 1. No significant change from the prior study. 2. Persistent opacity in the left lung, which may be due to atelectasis or pneumonia. Persistent bilateral interstitial thickening is stable. No new lung abnormalities. 3. Support apparatus is stable and well positioned. Electronically Signed   By: Lajean Manes M.D.   On: 02/10/2015 07:25   Dg Chest Port 1 View  02/08/2015  CLINICAL DATA:  Mediastinal mass. EXAM: PORTABLE CHEST 1 VIEW COMPARISON:  Earlier today FINDINGS: Tracheostomy tube tip is above the carina. There is a right IJ catheter with tip in the projection of the cavoatrial junction. There is a nasogastric tube with tip in the stomach. Cardiac enlargement and aortic atherosclerosis noted left perihilar and left base opacification is unchanged from previous exam. IMPRESSION: 1. No change in aeration to left lung compared with prior exam. 2. Stable support apparatus. Electronically Signed   By: Kerby Moors M.D.   On: 02/08/2015 16:55   Dg Chest Port 1 View  02/08/2015  CLINICAL DATA:  History of diffuse large B-cell lymphoma and melanoma. Mediastinal mass. EXAM: PORTABLE CHEST 1 VIEW COMPARISON:  02/08/2015 and 02/07/2015 FINDINGS: Tracheostomy tube and right IJ central venous catheter unchanged. Nasogastric tube courses into the stomach and off the inferior portion of the film. Stable opacification of the left  perihilar region and left base. Right lung is clear. Remainder of the exam is unchanged. IMPRESSION: Stable opacification over the left perihilar region and left base. Tubes and lines unchanged. Electronically Signed   By: Marin Olp M.D.   On: 02/08/2015 10:23   Dg Chest Port 1 View  02/08/2015  CLINICAL DATA:  Acute respiratory failure. EXAM: PORTABLE CHEST 1 VIEW COMPARISON:  February 07, 2015. FINDINGS: Tracheostomy and nasogastric tubes are unchanged in position. Stable cardiomegaly. Right internal jugular catheter is noted with distal tip in expected position of the SVC. No pneumothorax is noted. Mild central pulmonary vascular congestion is noted. Stable left perihilar and basilar opacity is noted concerning for atelectasis or pneumonia with possible associated pleural effusion. IMPRESSION: Stable support apparatus. Stable left perihilar and basilar opacity is noted concerning for pneumonia or atelectasis with associated pleural effusion. Electronically Signed   By: Marijo Conception, M.D.   On: 02/08/2015 07:26   Dg Chest Port 1 View  02/07/2015  CLINICAL DATA:  Respiratory failure.  Mediastinal mass. EXAM: PORTABLE CHEST 1 VIEW COMPARISON:  Radiographs from 02/02/2015 through 02/06/2015 FINDINGS: Tracheostomy tube, NG tube and central catheter appear unchanged. Slight increased atelectasis at the right base. Persistent atelectasis at the left base. Pulmonary vascularity is normal. IMPRESSION: Slight increased atelectasis at the right base. Persistent atelectasis on the left. Electronically Signed   By: Lorriane Shire M.D.   On: 02/07/2015 07:42   Dg Chest Port 1 View  02/06/2015  CLINICAL DATA:  Respiratory failure EXAM: PORTABLE CHEST 1 VIEW COMPARISON:  Yesterday FINDINGS: Tubular devices are stable. Airspace disease throughout the left lung and at the right base is worse. Heart remains enlarged. Stable vascular congestion. Less aeration in yesterday. IMPRESSION: Worsening bilateral airspace  disease left greater than right. Electronically Signed   By: Marybelle Killings M.D.   On: 02/06/2015 07:41   Dg Chest Port 1 View  02/05/2015  CLINICAL DATA:  Respiratory failure. Mediastinal mass with pericardial effusion. EXAM: PORTABLE CHEST 1 VIEW COMPARISON:  Radiographs dated 02/03/2015 and 02/04/2015 FINDINGS: Central line, NG tube, and tracheostomy tube appear in good position, unchanged. There is new slight pulmonary vascular congestion. No change in slight atelectasis in left lung. Mediastinal mass is obscured due to rotation. Right lung is clear of infiltrates and atelectasis. IMPRESSION: New slight pulmonary vascular prominence. Slight atelectasis in the left midzone, stable. Electronically Signed   By: Lorriane Shire M.D.   On: 02/05/2015 07:24   Dg Chest Port 1 View  02/04/2015  CLINICAL DATA:  69 year old female with respiratory failure and mediastinal mass. EXAM: PORTABLE CHEST 1 VIEW COMPARISON:  02/03/2015 and prior radiograph FINDINGS: Tracheostomy tube, right IJ central venous catheter with tip overlying the lower SVC, and NG tube with tip overlying the proximal -mid stomach again noted. A large left mediastinal mass is again noted. The cardiomediastinal silhouette is unchanged. There is no evidence of pneumothorax. Continued left basilar atelectasis noted. IMPRESSION: Little significant change. Left mediastinal mass with left basilar atelectasis Electronically Signed   By: Margarette Canada M.D.   On: 02/04/2015 07:42   Dg Chest Port 1 View  02/03/2015  CLINICAL DATA:  Respiratory distress. Patient with a tracheostomy. Subsequent encounter. EXAM: PORTABLE CHEST 1 VIEW COMPARISON:  02/02/2015 FINDINGS: Masslike opacity obscures the left hilum and left mediastinum merging with the left heart border. This is without significant change. No convincing pulmonary edema. Probable atelectasis at the left lung base. No pneumothorax or obvious pleural effusion. Tracheostomy tube and right internal jugular  central venous line are stable and well positioned. New orogastric tube passes below the diaphragm into the stomach. IMPRESSION: 1. No acute findings in the lungs. 2. Stable masslike opacity along the anterior mediastinum. 3. New orogastric tube is well positioned. Tracheostomy tube and right internal jugular central venous line are stable and also well positioned. Electronically Signed   By: Lajean Manes M.D.   On: 02/03/2015 08:34   Dg Chest Port 1 View  02/02/2015  CLINICAL DATA:  Tracheostomy tube placement. EXAM: PORTABLE CHEST 1 VIEW COMPARISON:  Earlier film 02/02/2015. FINDINGS: The tracheostomy tube is in good position with the tip at the mid tracheal level. The right IJ catheter is stable. Stable mediastinal adenopathy/mass and persistent left lower lobe infiltrate/atelectasis/effusion. IMPRESSION: Tracheostomy tube in good position without complicating features. Electronically Signed   By: Marijo Sanes M.D.   On: 02/02/2015 12:18   Dg Chest Port 1 View  02/02/2015  CLINICAL DATA:  Shortness of breath. EXAM: PORTABLE CHEST 1 VIEW  COMPARISON:  02/01/2015 FINDINGS: Examination is partially limited by leftward patient rotation. Endotracheal tube terminates above the level of the thoracic inlet and has likely been slightly retracted in the interim. Enteric tube courses towards the left upper abdomen with tip not imaged. Right jugular central venous catheter remains, projecting over the SVC. Known, large mediastinal mass is again seen. Cardiac silhouette remains enlarged. Left chest tube remains in place. No pneumothorax is identified. Left basilar opacities have slightly improved. Mild right basilar opacity is unchanged may reflect atelectasis. IMPRESSION: 1. Mild interval retraction of the endotracheal tube which projects above the thoracic inlet. Consider advancement. 2. Minimally improved aeration of the left lung base. No pneumothorax. Electronically Signed   By: Logan Bores M.D.   On:  02/02/2015 07:18   Dg Chest Port 1 View  02/01/2015  CLINICAL DATA:  Pericardial window.  Pericardial effusion. EXAM: PORTABLE CHEST 1 VIEW COMPARISON:  01/31/2015 FINDINGS: Endotracheal tube in good position. NG tube in place with the tip not visualized due to underpenetration. Right jugular central venous catheter tip in the SVC. No pneumothorax on the right Interval improvement in aeration of the left lung with decrease in density left lower lobe. This may be due to decreased diffusion atelectasis. Left chest tube remains in place. No pneumothorax on the left. Cardiac enlargement has improved. Right lower lobe atelectasis is present. IMPRESSION: Endotracheal tube in good position. Decreased left lower lobe density with improved aeration of the left lung. Left chest tube in place without pneumothorax. Electronically Signed   By: Franchot Gallo M.D.   On: 02/01/2015 07:16   Dg Chest Port 1 View  01/31/2015  CLINICAL DATA:  Status post median sternotomy and pericardial window creation for pleural effusion, acute respiratory failure, cardiac tamponade, mediastinal mass. EXAM: PORTABLE CHEST 1 VIEW COMPARISON:  Portable chest x-ray of January 27, 2015 FINDINGS: The trachea has apparently been extubated. There is linear radiodensity that projects over the lower neck which could be a portion of the endotracheal tube but if so it is positioned quite high. The right lung remains well-expanded and clear. On the left only a small amount of aerated lung persists in the apex and laterally. The left chest tube is stable. The large mediastinal mass is unchanged. There is no pneumothorax nor definite pleural effusion. The esophagogastric tube tip projects below the inferior margin of the image. The right internal jugular venous catheter tip projects over the midportion of the SVC. IMPRESSION: Stable appearance of the chest following extubation of the trachea. Persistent large mediastinal mass. There is no pneumothorax.  Electronically Signed   By: David  Martinique M.D.   On: 01/31/2015 07:33   Dg Chest Port 1 View  01/30/2015  CLINICAL DATA:  Persistent fever. Status post pericardial window and mediastinal biopsy. EXAM: PORTABLE CHEST 1 VIEW COMPARISON:  Multiple chest x-rays and chest CT from 01/19/2015 FINDINGS: The need support apparatus is stable. The endotracheal tube is 4.3 cm above the carina. The NG tube is stable. The left-sided chest tube is stable. Persistent large mediastinal mass occupying good portion of the left hemi thorax. Persistent vascular congestion and bibasilar atelectasis. IMPRESSION: Stable support apparatus. Stable left-sided chest tube.  No definite pneumothorax. Stable large mass occupying a good portion of the left hemi thorax. Electronically Signed   By: Marijo Sanes M.D.   On: 01/30/2015 23:30   Dg Chest Port 1 View  01/30/2015  CLINICAL DATA:  Intubated patient, acute respiratory failure and hypoxia, history of mediastinal mass, pericardial  effusion and cardiac tamponade EXAM: PORTABLE CHEST 1 VIEW COMPARISON:  Portable chest x-ray of January 29, 2015. FINDINGS: Stable volume loss on the left with large mediastinal mass. The left-sided chest tubes are unchanged in position. There is no pneumothorax nor large pleural effusion. The right lung is well-expanded. Mild interstitial prominence throughout the right lung is stable. The pulmonary vascularity is not engorged. The endotracheal tube tip lies 3.8 cm above the carina. The right internal jugular venous catheter tip projects over the midportion of the SVC. The esophagogastric tube tip projects below the inferior margin of the image. IMPRESSION: There has been no significant change in the appearance of the chest since yesterday's study. The support tubes are in reasonable position. Electronically Signed   By: David  Martinique M.D.   On: 01/30/2015 07:21   Dg Chest Port 1 View  01/29/2015  CLINICAL DATA:  Shortness of breath, acute respiratory  failure, intubated patient, history of mediastinal mass, pericardial effusion, and cardiac tamponade. EXAM: PORTABLE CHEST 1 VIEW COMPARISON:  Portable chest x-ray of January 28, 2015 FINDINGS: The right lung is well-expanded. There is a trace of pleural fluid at the right lung base. On the left the cardiac silhouette occupies much of the hemi thorax. A small amount of aerated lung in the apex and inferior laterally is still demonstrated. The endotracheal tube tip projects approximately 3.7 cm above the carina. The esophagogastric tube tip projects below the inferior margin of the image. A left-sided chest tube has its tip located over the lateral aspect of the approximately 6 rib and is stable. A second tube versus the tip of the esophagogastric tube is noted medially at the left lung base. IMPRESSION: Persistent large mediastinal mass on the left. Slight interval worsening in volume loss on the left. The cardiac silhouette remains enlarged. No pneumothorax nor large left pleural effusion is observed. The right lung is largely clear. Electronically Signed   By: David  Martinique M.D.   On: 01/29/2015 07:25   Dg Chest Port 1 View  01/28/2015  CLINICAL DATA:  ET tube placement EXAM: PORTABLE CHEST 1 VIEW COMPARISON:  01/27/15 FINDINGS: The ET tube tip is stable above the carina. There is a right IJ catheter with tip in the projection of the SVC. Left-sided chest tube is identified. No pneumothorax. Stable cardiac enlargement and stable large anterior mediastinal mass. No change in small bilateral pleural effusions and pulmonary edema consistent with CHF. IMPRESSION: 1. Left chest tube in place without visible pneumothorax. 2. Large anterior mediastinal mass 3. Bilateral pleural effusions and pulmonary edema. Electronically Signed   By: Kerby Moors M.D.   On: 01/28/2015 08:28   Dg Chest Port 1 View  01/27/2015  CLINICAL DATA:  Pericardial effusion EXAM: PORTABLE CHEST 1 VIEW COMPARISON:  02/05/2015 FINDINGS: ET  tube tip is stable above the carina. There is a right IJ catheter with tip in the projection of the SVC. Left chest tube is in place. No pneumothorax. NG tube tip is in the stomach. Cardiac enlargement and large anterior mediastinal mass is again noted. Unchanged from previous exam. Increase in pleural fluid noted bilaterally. IMPRESSION: 1. Stable support apparatus. 2. Left chest tube without pneumothorax. 3. Increase in bilateral pleural effusions. Electronically Signed   By: Kerby Moors M.D.   On: 01/27/2015 09:00   Dg Chest Portable 1 View  02/15/2015  CLINICAL DATA:  Status post pericardial window and Chamberlain procedure for large mediastinal mass with associated pericardial effusion. EXAM: PORTABLE CHEST 1 VIEW COMPARISON:  CT of the chest on 01/19/2015 FINDINGS: Endotracheal tube present with the tip approximately 2.5 cm above the carina. Right jugular central line has been placed with the catheter tip in the SVC. Pericardial drain and left-sided chest tube present. Nasogastric tube extends into the distal esophagus and does not extend below the diaphragm. No pneumothorax identified. Lung volumes are very low bilaterally. Huge mediastinal mass again visualized which is more prominent to the left of midline. IMPRESSION: 1. No evidence of pneumothorax postoperatively. 2. Low lung volumes. 3. Nasogastric tube tip lies in the distal esophagus. Electronically Signed   By: Aletta Edouard M.D.   On: 02/16/2015 14:30   Dg Abd Portable 1v  02/12/2015  CLINICAL DATA:  Followup distended abdomen. Evaluate NG tube placement. EXAM: PORTABLE ABDOMEN - 1 VIEW COMPARISON:  02/08/2015 FINDINGS: The nasogastric tube tip is in the expected location of the duodenum. Gaseous distension of the large and small bowel loops identified and appear increased from previous exam. IMPRESSION: 1. NG tube tip is in the duodenum. 2. Persistent gaseous distension of bowel loops. Electronically Signed   By: Kerby Moors M.D.    On: 02/12/2015 09:29   Dg Abd Portable 1v  02/08/2015  CLINICAL DATA:  Nausea and vomiting. History of lymphoma and melanoma. EXAM: PORTABLE ABDOMEN - 1 VIEW COMPARISON:  02/05/2015. FINDINGS: Scattered air throughout the small bowel and colon and some residual contrast in the colon. Findings suggest a mild ileus. IMPRESSION: Persistent mild ileus bowel gas pattern. Electronically Signed   By: Marijo Sanes M.D.   On: 02/08/2015 10:09   Dg Abd Portable 1v  02/05/2015  CLINICAL DATA:  NG tube placement. EXAM: PORTABLE ABDOMEN - 1 VIEW COMPARISON:  CT scan 02/01/2015 FINDINGS: The NG tube is in the body region of the stomach. Scattered air in the small bowel and colon along with some contrast in the colon could suggest a mild ileus. No free air. Bibasilar atelectasis. IMPRESSION: NG tube in good position in the body region the stomach. Moderate air throughout the bowel may suggest a diffuse ileus. Electronically Signed   By: Marijo Sanes M.D.   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 LDH level 950. 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 Cytogenetics and FISH studies pending. Low CD20 positivity which can be seen about 16-20% of diffuse large B-cell lymphoma is and could potentially portend a poor prognosis independent of the IPI index. (Blood 2009 [id number] Hilaria Ota et al.) Cannot r/o a double hit lymphoma - FISH for BCL2 and cMYC pending results. Blood counts stable. On presentation had difficulty with breathing due to airway compression. 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 Decreasing LDH levels suggestive of response to treatment. Patient  had better weaning trials today. S/p first cycle of CHOP on 02/02/2015 and Rituxan 02/05/2015. LDH down from 950 to about 300 suggesting good response to treatment thus far but still continues to have multifactorial challenges with weaning. Patient's neutropenia appears to have resolved with G-CSF.  #2 pancytopenia related to chemotherapy and lymphoma. Symptomatic anemia much improved after blood transfusions. #3 s/p Febrile neutropenia.  Neutropenia resolved with G-CSF . Neupogen discontinued . Plan -continue to monitor cbc daily.  -Discontinued G-CSF today. -Would have  some back/rib pains and pelvic and shoulder girdle pains with G-CSF which typically resolve in 2 or 3 days. -on broad-spectrum antibiotics for neutropenic fever. Vancomycin on hold due to high troughs and bump in kidney function. -ongoing weaning trials as per pulmonary/CCM team -Will need outpatient PET/CT scan as possible.   #4 ?HCAP/ventilator acquired pneumonia - elevated procalcitonin --- improving. Strept in secretions treated with Ceftriaxone - now off antibiotics. ?underlying sleep apnea - given body habitus. Might have lower O2 sats at baseline. Now on broad spectrum antibiotics for febrile neutropenia. Plan -antibiotics/weaning trials and vent management as per critical care team. Apparently agitation and not oxygenation or lung collapse have been limiting factors.  #5 acute kidney injury -likely related to vancomycin. Has had 2 elevated trough level. Has not had any evidence of tumor lysis syndrome thus far. No overt hypertension. No overt evidence of untreated sepsis. #6 elevated bilirubin and increasing alkaline phosphatase levels. Biliary obstruction? . Elevated alkaline phosphatase could also be from bedbound status. Rule out urinary obstruction with imaging. Plan -Vancomycin on hold as per ICU team. -Consider abdominal imaging to look at the hepatobiliary system and bowels. Would not be able to use IV  contrast for her CT scan at this time given her acute kidney injury. -Consider nephrology consultation as needed.  #6 Previous history of upper extremity melanoma status post resection couple of years ago. Family notes that she was followed at [facility] with repeat scans that showed no evidence of metastatic disease or recurrence. She was last evaluated about a year ago.  #7 history of rheumatoid arthritis previously on methotrexate. Off methotrexate now on chemotherapy for her diffuse large B-cell lymphoma.   #8Hyperglycemia due to steroids ?h/o DM2. Improving control off steroids.  #9 left upper extremity acute DVT- likely related to left innomiate venous compression by mediastinal mass and hypercoag state from lymphoma, hospitalization obesity etc. Plan -on iv heparin currently. -would need to transition to Lovenox eventually if renal function stable.  #8 Anasarca due to hypoalbuminemia due to hypercatabolic state in the setting of lymphoma/chemotherapy/acute ICU status. -getting tube feeding.   Will continue to follow. Please call with any additional questions arise.  Sullivan Lone MD [place] AAHIVMS [facility] Hematology/Oncology Physician [facility]  (Office):       [phone] (Work cell):  [phone] (Fax):           [phone]

## 2015-02-22 NOTE — Progress Notes (Signed)
Pharmacy Antibiotic Time-Out Note  Maria Bauer is a 69 y.o. year-old female admitted on 02/04/2015.  The patient is currently on  for Zosyn 3.375 grams IV q8h (by extended infusion) and vancomycin 1250 mg IV q12h for neutropenic fever.  Assessment: Neutropenia improving with Granix (ANC=9.7). Blood culture from 11/18 growing coag-negative Staph in 1 of 2 sets (significance uncertain). SCr continues to rise (0.36 > 1.13 > 1.48) UOP 467ml since midnight   Plan:  Continue to hold vancomycin  Check random vancomycin level in AM 11/24  Continue Zosyn 3.375gm IV q8h (4hr extended infusions)  Follow ANC, renal function, clinical course.  Review planned DOT.   Temp (24hrs), Avg:98.8 F (37.1 C), Min:98 F (36.7 C), Max:100.3 F (37.9 C)   Recent Labs Lab 02/10/15 1704 02/11/15 0630 02/12/15 0500 02/13/15 0555 2015-03-14 0320  WBC 0.3* 0.1* 0.2* 3.4* 11.2*     Recent Labs Lab 02/10/15 0623 02/11/15 0630 02/12/15 0500 02/13/15 0555 03-14-15 0320  CREATININE 0.39* 0.50 0.36* 1.13* 1.48*   Estimated Creatinine Clearance: 39.6 mL/min (by C-G formula based on Cr of 1.48).    Antimicrobial allergies: none  Antimicrobials this admission: 1/4 >> Cefuroxime (post-op abx) >> 11/8 11/9 >> Ceftazidime >> 11/12 11/12 >> Rocephin >>11/16 11/19 >> Vanc >> 11/19 >> Zosyn >>  Levels/dose changes this admission: 11/21: vanc level 37 mcg/ml, but was drawn after dose instead of before 11/22: vanc trough 33 mcg/ml drawn before dose - hold vanc 11/23: vanc random 23 mcg/ml, ~30 hrs since last dose  Microbiology Results: 11/3 MRSA PCR: negative 11/5 Trach asp: NGF 11/7 Cdiff: Ag negative, Toxin negative 11/8 Blood x2: NGF 11/9 Trach asp: moderate B-hem strep (not group A) - F 11/18 blood: 1/2 coag-negative Staph   Thank you for allowing pharmacy to be a part of this patient's care.  Peggyann Juba, PharmD, BCPS Pager: [phone]  2015/03/14 8:55 AM

## 2015-02-22 NOTE — Progress Notes (Signed)
Received CBC results from lab at 1330 showing the pts Hgb at 7.7 which was a drop from 10.6 early this morning. NP notified of  hgb drop, lactic acid level, decreased urine output, as well as pt increased pain and agitation (increased RR, HR increased, increasingly diaphoretic), decreasing BP, as well as patient look of pallor. MD ordered 2 units PRBC, Levophed drip, etc. Will continue to monitor.

## 2015-02-22 NOTE — Progress Notes (Signed)
0030-On examination patient right arm is flaccid and she is unable to grip with right hand. Patient was previously able to grip with both hands during initial assessment @ 2000. Patient able to wiggles toes on both feet, squeeze with left hand and move left arm without difficulty. Patient follows commands, gestures appropriately, pupils equal and reactive, smile symmetrical. Elink physician Dr. Vaughan Browner made aware of new assessment findings, STAT CT of head ordered.

## 2015-02-22 NOTE — Progress Notes (Signed)
Name: Maria Bauer MRN: [id number] DOB: 1946/02/14    ADMISSION DATE:  02/09/2015  CONSULTATION DATE:  01/29/2015  REFERRING MD :  Dr. 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  Tolerated pressure support. 11/14  Episode of SVT with suctioning overnight.  Did not tolerated PSV this am due to anxiety. 11/17  Weaned on PSV until 1700 yesterday, vomiting overnight x2.  LUE swollen ([place] pending) 11/17 UE Duplex >> neg RUE, acute DVT involving the internal jugular, subclavian and proximal axillary veins 11/19  Empiric abx restarted in setting of fever with neutropenia 11/20  Weaned 3 hours on ATC 11/22  PRBC for anemia, TF restarted, remains agitated / uncomfortable appearing  11/23  CT head>>>  11/23  CT abd/pelvis 11/23>>>   SUBJECTIVE:   Agitation somewhat improved. Pt awake, nods appropriately, mouths words.  C/o excruciating abd pain.  Writhing in bed.  Tachy 140's.  R arm weakness overnight, neg head CT, now resolved.    VITAL SIGNS: BP 145/97 mmHg  Pulse 122  Temp(Src) 98.4 F (36.9 C) (Oral)  Resp 19  Ht 5\' 4"  (1.626 m)  Wt 210 lb 5.1 oz (95.4 kg)  BMI 36.08 kg/m2  SpO2 97%   VENT SETTINGS: Vent Mode:  [-] PCV FiO2 (%):  [30 %] 30 % Set Rate:  [12 bmp] 12 bmp PEEP:  [5 cmH20] 5 cmH20 Plateau Pressure:  [12 cmH20-20 cmH20] 20 cmH20   INTAKE/OUTPUT: I/O last 3 completed shifts: In: 3722.2 [I.V.:1188.9; Blood:288.8; NG/GT:1794.5; IV  Piggyback:450] Out: [id number]  PHYSICAL EXAMINATION:  General: elderly female, appears very uncomfortable, no resp distress  HENT: trach site with mild erythema, large neck, difficult to assess JVD PULM: resps even/non-labored, few scattered rhonchi, frequent coughing CV: s1s2 rrr, no m/r/g, ST on monitor, site anterior chest c/d/i GI: mildly distended, hypoactive bs, exquisitely tender diffusely  MSK: generalized edema, LUE swelling  Neuro: generalized weakness, MAE, equal strength bilat, communicates appropriately with questioning  CBC Recent Labs     02/12/15  0500  02/13/15  0555  Mar 05, 2015  0320  WBC  0.2*  3.4*  11.2*  HGB  7.1*  10.6*  10.6*  HCT  22.9*  32.5*  32.7*  PLT  124*  151  152   Coag's No results for input(s): APTT, INR in the last 72 hours.  BMET Recent Labs     02/12/15  0500  02/13/15  0555  2015/03/05  0320  NA  143  142  140  K  3.3*  3.5  3.9  CL  104  104  106  CO2  28  27  24   BUN  11  18  22*  CREATININE  0.36*  1.13*  1.48*  GLUCOSE  91  133*  147*    Electrolytes Recent Labs     02/12/15  0500  02/13/15  0555  Mar 05, 2015  0320  CALCIUM  7.5*  7.8*  7.7*   Sepsis Markers No results for input(s): PROCALCITON,  O2SATVEN in the last 72 hours.  Invalid input(s): LACTICACIDVEN  ABG No results for input(s): PHART, PCO2ART, PO2ART in the last 72 hours.   Liver Enzymes Recent Labs     02/12/15  0500  02/13/15  0555  27-Feb-2015  0320  AST  14*  17  21  ALT  21  19  19   ALKPHOS  81  192*  263*  BILITOT  1.9*  2.2*  2.1*  ALBUMIN  1.8*  2.1*  2.1*   Glucose Recent Labs     02/13/15  1137  02/13/15  1556  02/13/15  2009  02/13/15  2334  February 27, 2015  0341  2015-02-27  0732  GLUCAP  109*  110*  106*  118*  141*  139*    Imaging Ct Head Wo Contrast  2015-02-27  CLINICAL DATA:  Decreased responsiveness. Not using right arm. First noted at 0024 hours.Code Stroke. EXAM: CT HEAD WITHOUT CONTRAST TECHNIQUE: Contiguous axial images  were obtained from the base of the skull through the vertex without intravenous contrast. COMPARISON:  None. FINDINGS: Examination is somewhat limited due to motion artifact despite repeat imaging. Mild cerebral atrophy. No ventricular dilatation. Mild periventricular low-attenuation changes suggesting small vessel ischemia. No mass effect or midline shift. No abnormal extra-axial fluid collections. Gray-white matter junctions are distinct. Basal cisterns are not effaced. No evidence of acute intracranial hemorrhage. No depressed skull fractures. Mild mucosal thickening in the paranasal sinuses. Vascular calcifications. IMPRESSION: No acute intracranial abnormalities. Mild chronic atrophy and small vessel ischemic changes. These results were called by telephone at the time of interpretation on 27-Feb-2015 at 1:40 am to [facility], the patient's nurse on the ICU, who verbally acknowledged these results. Electronically Signed   By: Lucienne Capers M.D.   On: February 27, 2015 01:41   Dg Chest Port 1 View  02/13/2015  CLINICAL DATA:  Hx of respiratory failure/distress. Pt has a trach in place and is on a vent. Diffuse large B cell lymphoma, HTN, asthma. Chronic SOB. Pt is not verbally responsive and was not able to be woken up for exam. EXAM: PORTABLE CHEST 1 VIEW COMPARISON:  02/12/2015 FINDINGS: Area of opacity in the left mid lung with associated ppd and left hilar region fullness is stable. Vascular prominence interstitial thickening is also unchanged. There are no new areas of lung opacity. No pneumothorax. Tracheostomy tube, orogastric tube and right internal jugular central venous catheter are stable. IMPRESSION: 1. No change from the previous day's study. 2. Stable pulmonary venous congestion and probable left perihilar atelectasis associated with the known mass. 3. Support apparatus is stable and well positioned. Electronically Signed   By: Lajean Manes M.D.   On: 02/13/2015 10:43   Dg Abd Portable  1v  02/12/2015  CLINICAL DATA:  Followup distended abdomen. Evaluate NG tube placement. EXAM: PORTABLE ABDOMEN - 1 VIEW COMPARISON:  02/08/2015 FINDINGS: The nasogastric tube tip is in the expected location of the duodenum. Gaseous distension of the large and small bowel loops identified and appear increased from previous exam. IMPRESSION: 1. NG tube tip is in the duodenum. 2. Persistent gaseous distension of bowel loops. Electronically Signed   By: Kerby Moors M.D.   On: 02/12/2015 09:29    ASSESSMENT / PLAN:  PULMONARY Trach 11/11 >> A: Acute respiratory failure. Failure to wean from vent s/p trach. Thus far has not tolerated PSV, significant contributor is cough leading to asynchrony and vent alarms Concern for aspiration - vomiting overnight 11/17, ? TF suctioned from trach  Persistent cough P: Pressure support wean daytime, pressure control qhs as rest mode for now Attempt to get her upright, avoid trach contact with tracheal wall; continued cough appears to be source of much of her discomfort F/u CXR intermittently   CARDIOVASCULAR: Rt IJ CVL 11/04 >> 11/22 R PICC 11/22 (IR) >> A: Malignant pericardial effusion s/p window. Hypotension likely from hypovolemia and sedation >> improved but still labile w sedating meds Hx of HLD, HTN. Sinus tachycardia - multifactorial, worse in setting pain/agitation  P: Continue outpatient lopressor, dose decreased 11/19.  PICC Line per IR 11/23 Continue zocor  Monitor hemodynamics Monitor incision sites / drainage  RENAL A:  AKI  Hypokalemia. Hypernatremia. P: Replace electrolytes as indicated Continue free water, 250 Q6 Continue D5 1/2NS at 50 ml/hr with AKI Follow I/O  F/u chem  NO IV contrast with CT abd   GASTROENTEROLOGY:  A:  Nutrition. Gastroparesis. Mild Ileus - noted on KUB 11/17 Nausea / Vomiting - new, 11/17. Coffee grounds from NGT 11/19 Abd pain - excruciating abd pain 11/23 P:  Continue hold TF for now   CT abd/pelvis 11/23 Zantac for SUP PRN zofran for nausea / vomiting   HEMATOLOGY/ONCOLOGY: A: B cell lymphoma. Anemia of critical illness - s/p transfusion 11/22. LUE Swelling - positive for DVT 11/17 in L IJ, L [place], L axillary veins Neutropenia - new 11/18 Thrombocytopenia  P: Chemotherapy (R-CHOP) >> completed 1st cycle 11/11, Rituxan 11/14 S/p course steroids  ONC notes reflect high dose steroid need completed 11/15.   Allopurinol for tumor lysis syndrome prophylaxis F/u CBC Heparin gtt for DVT  Monitor Hgb drift - has decreased from 9 to 7.1  ID:  A: Tracheobronchitis. Neutropenic Fever P: Treated w ceftriaxone thru 11/16  Blood 11/09 >> neg Sputum 11/09 >> Moderate Streptococcus, beta hemolytic Blood 11/18 >> Coag neg staph 1/2 >> suspect contaminant  C-Diff 11/19 >> neg  Vanco 11/19 >>  Zosyn 11/19 >>   Continue empiric abx as above  Significant jump in wbc 11/23 - CT abd pending  Nystatin powder to perineal area D/c CVL once PICC placed   ENDOCRINE: A: Steroid induced hyperglycemia. P: SSI  Continue to hold Levemir, was previously on 25 units with TF running.  NEUROLOGY:  A:  Agitation / ? Delirium ICU / Cancer Associated Pain Anxiety P: RASS goal 0 PRN fentanyl for pain  Fentanyl patch added 11/18 by oncology Klonopin 0.5 mg BID  PRN ativan for agitation  Agitation / pain has been difficult to manage due to hypotension from meds. Will attempt to balance.  One time haldol did seem to help significantly - will add low dose scheduled haldol x24 hours, and cont monitor qTc   RHEUMATOLOGY: A: Hx of RA >> was on MTX as outpt. P: Will need to re-assess as outpt   Nickolas Madrid, NP 02-19-15  8:32 AM Pager: [phone] or [phone]

## 2015-02-22 NOTE — Progress Notes (Signed)
Spiritual care spoke with family. Based on families cultural beliefs they will be having a family meeting tomorrow to decide on the funeral home. Bed placement's number was given to family in order for them to contact upon decision.

## 2015-02-22 NOTE — Progress Notes (Signed)
30 min family meet with husband, daughter, son in law and Marylyn Ishihara grandson  Daughter is Therapist, sports for Dr Juanita Craver. Says patient husband her dad does not realize how critically ill patient is. PAtient husband said he is main Engineer, site because he is husband and directed me to talk only to him when all of [place] including RN in room  Explained current issue with bleeding and non surgical mgmt Explained if not controlled she is at risk for death tonight or next few days IF survives then there is concern for ATN and other issues He feels she isin pain  And wants this controlled He said he is ready to accept her death if it happened  Unclear if they want to or ready to withdraw  Resolved  - DNAR but full medical care + focus on pain mgmt + comfort - IF declines, then increase comfort measures - stillhope for improved outcome which they realize is slipping away  30 min ccm time at bedside  Dr. Brand Males, M.D., [facility].C.P Pulmonary and Critical Care Medicine Staff Physician [place] [facility] Pager: [phone], If no answer or between  15:00h - 7:00h: call [phone]  Feb 23, 2015 5:20 PM

## 2015-02-22 NOTE — Progress Notes (Signed)
243 cc of fentanyl wasted in sink with [name]

## 2015-02-22 NOTE — Progress Notes (Addendum)
Update:  I am at remote location  - Got word from NP that patient now hypotensive and Hgb dropped. NP had ordered levophed and PRBC  - spoke to nurse  - now hypotensive. S/p protamine per RN  -   Recent Labs Lab 02/11/15 0630 02/12/15 0500 02/13/15 0555 Feb 28, 2015 0320 02-28-2015 1320  HGB 7.6* 7.1* 10.6* 10.6* 7.7*    - not making urine either  Plan   - levophed via picc line -  prbc 2 units stat  - hopefully will self tamponade    - d/w Dtr Antoinette - [phone] [id number] - if bleeding uncontrolled can be a terminal event today. She says maintain full code for now Will be in with dad when she can Feb 28, 2015. She has family in [place] and [place] - so cannot take decision without talking to them. She is agreeable for comprehensive pall care consult - so ordered   - chaplain consult ordered   - ok for LUE radial aline (she had DVT in proximal LUE) to be placed by RT    - femoral aline if above measures not picking up BP   - CPR if arrests  D/w Dr Lamonte Sakai in the box eICU  Dr. Brand Males, M.D., [facility].C.P Pulmonary and Critical Care Medicine Staff Physician [place] [facility] Pager: [phone], If no answer or between  15:00h - 7:00h: call [phone]  02-28-15 3:25 PM

## 2015-02-22 NOTE — Progress Notes (Signed)
ANTICOAGULATION CONSULT NOTE - Brief Follow Up  Pharmacy Consult for heparin Indication: DVT  Please see pharmacist note from 11/22 by Cristopher Estimable for further detail.  Heparin level now therapeutic at 0.64 after reducing rate to 2200 units/hr.  No problems reported per RN.   Plan:  Continue heparin at 2200 units/hr. Recheck heparin level in am   Dorrene German 2015/02/25 2:24 AM

## 2015-02-22 NOTE — Progress Notes (Signed)
Upon morning assessment pt was complaining of pain in her abdomen. She was grimacing, with an increased RR and increased HR. Tube feed was stopped and MD was notified. Order was added for CT with oral contrast.

## 2015-02-22 NOTE — Progress Notes (Signed)
Date: 2015-02-20 Chart reviewed for concurrent status and case management needs. Will continue to follow patient for changes and needs: CONTINUES TO REQUIRE FULL VENT SUPPORT-LEVEL OF ANEXITY UPON WEANING ATTEMPTS-QUESTION TO MD IF LTACH MAY BE APPROPRIATE. Velva Harman, RN, BSN, [place]   [phone]

## 2015-02-22 NOTE — Progress Notes (Signed)
Late entry   Told approx 1h agao of abd wall hematoma  plabn Advised RN  - stop IV heparin  - to d/w pharmacy and get protoamine stat   Dr. Brand Males, M.D., [facility].C.P Pulmonary and Critical Care Medicine Staff Physician [place] [facility] Pager: [phone], If no answer or between  15:00h - 7:00h: call [phone]  11-Mar-2015 1:58 PM       Ct Abdomen Pelvis Wo Contrast  2015-03-11  CLINICAL DATA:  Abdominal pain.  Fevers.  In patient on antibiotics. EXAM: CT ABDOMEN AND PELVIS WITHOUT CONTRAST TECHNIQUE: Multidetector CT imaging of the abdomen and pelvis was performed following the standard protocol without IV contrast. COMPARISON:  02/01/2015 FINDINGS: Lung bases: Small left pleural effusion. There is significant left lower lobe atelectasis. Minor subsegmental atelectasis in the right lower lobe. No pulmonary edema. Liver, spleen, gallbladder, pancreas, adrenal glands:  Unremarkable. Kidneys, ureters, bladder: Small stable nonobstructing stones in the right kidney. No renal masses. No hydronephrosis. Ureters are normal in course and in caliber. Bladder is decompressed with a Foley catheter. Uterus and adnexa:  Uterus surgically absent.  No pelvic masses. Lymph nodes:  No adenopathy. Ascites:  None. Vascular: Atherosclerotic calcifications noted along a normal caliber abdominal aorta and its branch vessels. Gastrointestinal: Nasogastric tube tip extends into the second portion of the duodenum. Stomach is unremarkable. There is no bowel dilation to suggest obstruction. There is no bowel wall thickening or mesenteric inflammation. Scattered air-fluid levels in the colon and small bowel are noted, nonspecific. This may reflect a mild adynamic ileus. Abdominal wall: Left abdominal wall/ rectus abdominus, hematoma. This measures approximately 18.4 x 6.8 x 14.7 cm. It is new from the prior exam. There is adjacent subcutaneous edema in the left  lateral lower abdomen. Musculoskeletal: Moderate compression fracture of L1 stable. Degenerative changes noted throughout the visualized spine. No osteoblastic or osteolytic lesions. IMPRESSION: 1. Large left rectus abdominus hematoma measuring 18.4 x 6.8 x 14.7 cm. This is new from the prior CT. 2. Left lower lobe atelectasis similar to the prior study. Small left pleural effusion, increased from the prior exam. Right lung base shows minor atelectasis, improved from the prior study. 3. Nonspecific air-fluid levels within small bowel and colon. This may reflect a mild adynamic ileus. No evidence of bowel obstruction. 4. No other acute findings. Electronically Signed   By: Lajean Manes M.D.   On: 03-11-2015 12:41   Ct Head Wo Contrast  03/11/2015  CLINICAL DATA:  Decreased responsiveness. Not using right arm. First noted at 0024 hours.Code Stroke. EXAM: CT HEAD WITHOUT CONTRAST TECHNIQUE: Contiguous axial images were obtained from the base of the skull through the vertex without intravenous contrast. COMPARISON:  None. FINDINGS: Examination is somewhat limited due to motion artifact despite repeat imaging. Mild cerebral atrophy. No ventricular dilatation. Mild periventricular low-attenuation changes suggesting small vessel ischemia. No mass effect or midline shift. No abnormal extra-axial fluid collections. Gray-white matter junctions are distinct. Basal cisterns are not effaced. No evidence of acute intracranial hemorrhage. No depressed skull fractures. Mild mucosal thickening in the paranasal sinuses. Vascular calcifications. IMPRESSION: No acute intracranial abnormalities. Mild chronic atrophy and small vessel ischemic changes. These results were called by telephone at the time of interpretation on 03-11-2015 at 1:40 am to [facility], the patient's nurse on the ICU, who verbally acknowledged these results. Electronically Signed   By: Lucienne Capers M.D.   On: 03-11-15 01:41   Ir Fluoro Guide  Cv Line  Right  2015-02-27  CLINICAL DATA:  SEPSIS, RESPIRATORY FAILURE EXAM: RIGHT UPPER EXTREMITY TRIPLE LUMEN POWER PICC LINE PLACEMENT WITH ULTRASOUND AND FLUOROSCOPIC GUIDANCE FLUOROSCOPY TIME:  12 SECONDS PROCEDURE: The patient was advised of the possible risks andcomplications and agreed to undergo the procedure. The patient was then brought to the angiographic suite for the procedure. The RIGHT arm was prepped with chlorhexidine, drapedin the usual sterile fashion using maximum barrier technique (cap and mask, sterile gown, sterile gloves, large sterile sheet, hand hygiene and cutaneous antisepsis) and infiltrated locally with 1% Lidocaine. Ultrasound demonstrated patency of the right basilic vein, and this was documented with an image. Under real-time ultrasound guidance, this vein was accessed with a 21 gauge micropuncture needle and image documentation was performed. A 0.018 wire was introduced in to the vein. Over this, a 6 [place] triple lumen power PICC was advanced to the lower SVC/right atrial junction. Fluoroscopy during the procedure and fluoro spot radiograph confirms appropriate catheter position. The catheter was flushed and covered with asterile dressing. Catheter length: 42 Complications: None immediate IMPRESSION: Successful right arm triple lumen power PICC line placement with ultrasound and fluoroscopic guidance. The catheter is ready for use. Electronically Signed   By: Jerilynn Mages.  Shick M.D.   On: 27-Feb-2015 09:17   Ir US Guide Vasc Access Right  February 27, 2015  CLINICAL DATA:  SEPSIS, RESPIRATORY FAILURE EXAM: RIGHT UPPER EXTREMITY TRIPLE LUMEN POWER PICC LINE PLACEMENT WITH ULTRASOUND AND FLUOROSCOPIC GUIDANCE FLUOROSCOPY TIME:  12 SECONDS PROCEDURE: The patient was advised of the possible risks andcomplications and agreed to undergo the procedure. The patient was then brought to the angiographic suite for the procedure. The RIGHT arm was prepped with chlorhexidine, drapedin the usual sterile fashion  using maximum barrier technique (cap and mask, sterile gown, sterile gloves, large sterile sheet, hand hygiene and cutaneous antisepsis) and infiltrated locally with 1% Lidocaine. Ultrasound demonstrated patency of the right basilic vein, and this was documented with an image. Under real-time ultrasound guidance, this vein was accessed with a 21 gauge micropuncture needle and image documentation was performed. A 0.018 wire was introduced in to the vein. Over this, a 6 [place] triple lumen power PICC was advanced to the lower SVC/right atrial junction. Fluoroscopy during the procedure and fluoro spot radiograph confirms appropriate catheter position. The catheter was flushed and covered with asterile dressing. Catheter length: 42 Complications: None immediate IMPRESSION: Successful right arm triple lumen power PICC line placement with ultrasound and fluoroscopic guidance. The catheter is ready for use. Electronically Signed   By: Jerilynn Mages.  Shick M.D.   On: 2015/02/27 09:17

## 2015-02-22 NOTE — Progress Notes (Addendum)
At [place] monitor flat-lined. patient heart beat was auscultated for one full minute by Javier Glazier and Adin Hector with no auditory sounds heard. Family at bedside, spiritual care present, MD aware.

## 2015-02-22 NOTE — Progress Notes (Signed)
CRITICAL VALUE ALERT  Critical value received:  Lactic acid 2.8  Date of notification:  02-21-15  Time of notification:  1310  Critical value read back:Yes.    Nurse who received alert:  Adin Hector, RN  MD notified (1st page):  Apolinar Junes, NP   Time Notified:1320  Time MD responded:  [phone]

## 2015-02-22 NOTE — Progress Notes (Signed)
Family has refused for RT to place and aline. RN aware and RN has called [employer] to make them aware.

## 2015-02-22 NOTE — Progress Notes (Signed)
Pt transported to CT scan and back to 2 west ICU while on ventilator.  Pt remained stable throughout the trip.  Trip was uneventful.  Ventilator O2, Medical Air and power hooked back up to the wall sources.  Wheels locked.

## 2015-02-22 DEATH — deceased

## 2015-02-23 ENCOUNTER — Encounter (HOSPITAL_COMMUNITY): Payer: Self-pay

## 2015-03-25 NOTE — Discharge Summary (Signed)
DISCHARGE SUMMARY    Date of admit: 01/28/2015  4:16 PM Date of discharge: March 14, 2015  6:07 PM Length of Stay: 20 days  PCP is Angelica Pou, MD   PROBLEM LIST Active Problems: DLBCL (diffuse large B cell lymphoma) (HCC)    Pericardial effusion   Mediastinal mass   HTN (hypertension)   Rheumatoid arthritis ([place])   Acute respiratory failure with hypoxia (HCC)   Pericardial effusion with cardiac tamponade   Cardiac tamponade   HLD (hyperlipidemia)     Encounter for antineoplastic chemotherapy   Neoplasm related pain   Acute on chronic respiratory failure (HCC)   Tracheostomy status (HCC)   Drug-induced neutropenia (HCC)   Symptomatic anemia   Chemotherapy-induced neutropenia (HCC)   Altered mental status   Hemorrhagic shock   DNAR (do not attempt resuscitation)    SUMMARY Aricia Gleba was 70 y.o. patient with    has a past medical history of High cholesterol; Hypertension; Acid reflux; Insomnia; Scoliosis; Asthma; Anemia; Peptic ulcer; Daily headache; Rheumatoid arthritis ([place]); Chronic lower back pain; Anxiety; Melanoma ([place]) (11/2011); and Diffuse large B cell lymphoma ([place]).   has past surgical history that includes Appendectomy; Total knee arthroplasty (Bilateral); Joint replacement; Total abdominal hysterectomy; Melanoma excision (Left, 11/2011); Lymph node biopsy; Axillary lymph node biopsy (Left); Inguinal lymph node biopsy (Left); Pericardial window (N/A, 01/27/2015); and Mediastinotomy chamberlain mcneil (Left, 02/17/2015).   Admitted on 02/10/2015 with   Eather Colas  70 y.o. female past medical history significant for HTN, RA, prior history of melanoma s/p resection, in remission, is being admitted from echo due to large pericardial effusion. Over the past 1-2 weeks patient has been complaining of a cough and progressive shortness of breath, she underwent a CT scan of her chest as an outpatient on 1028 which showed a mediastinal mass. She was referred  to Dr. Nils Pyle with [facility] whom she saw yesterday, and he felt like the mass is not resectable and likely needs biopsy. Given pericardial effusion she had a echo today which showed findings concerning for impending tamponade and patient was direct admitted to SDU. She endorses dyspnea and a persistent cough, she denies chest pain, denies palpitations. She denies any nausea/vomiting, denies abdominal pain. She has no lightheadedness or dizziness. She denies fever and chills.   CT Chest 10/28 12.0 x 8.8 cm abnormal soft tissue density seen in anterior mediastinum concerning for adenopathy related to lymphoma or other metastatic disease. Some compression of the left innominate vein is noted as a result. Moderate pericardial effusion is noted as well. 5 mm nodule is noted posteriorly in the right lower lobe. This may represent metastatic focus given above finding. Alternatively, it may be unrelated. Continued CT follow-up is recommended to ensure stability.  2D echo 11/3 Study Conclusions - Left ventricle: The cavity size was normal. Wall thickness was normal. Systolic function was normal. The estimated ejection fraction was in the range of 55% to 60%. Pericardium, extracardiac: Large pericardial effusion surrounds heart. There is some nodularity along epicardial surface,question tumor?. Effusion measures 30 mm in maximal dimension.There is RV indentation in diastole. There is mild mitral inflowvariation. IVC is dilated with blunted respiratory variation.    SUBSEQUENT COURSE  11/03 Admit 11/04 To OR for pericardial window and mediastinal mass bx >> remains on vent post-op 11/08 Fever, started Abx 11/09 Off pressors 11/10 Oncology consulted n-11/10 Mediastinal mass bx >> diffuse large B-cell lymphoma 11/11 Trach ([place]); transfer to [facility] for chemo (R-CHOP) 11/13 Remains on sedation. Tolerated pressure support.  11/14 Episode of SVT with suctioning overnight. Did not tolerated PSV this am due to  anxiety. 11/17 Weaned on PSV until 1700 yesterday, vomiting overnight x2. LUE swollen ([place] pending) 11/17 UE Duplex >> neg RUE, acute DVT involving the internal jugular, subclavian and proximal axillary veins 11/19 Empiric abx restarted in setting of fever with neutropenia 11/20 Weaned 3 hours on ATC 11/22 PRBC for anemia, TF restarted, remains agitated / uncomfortable appearing  March 02, 2015- had severe abd pain. Workup CT abd revealedleft abdominal wall rectus abdominus hematoma 18cm which ws new. Heparin was sstopped. Protamine and PRBC given. But she continued to decline. Extensive family conference hld and patient made DNAR and concurrent palliative care. Chaplain provided spiritual support. She passed away while still on ventilator   SIGNED Dr. Brand Males, M.D., [facility].C.P Pulmonary and Critical Care Medicine Staff Physician [facility] Pager: [phone], If no answer or between  15:00h - 7:00h: call [phone]  02/23/2015 12:24 AM

## 2015-06-01 ENCOUNTER — Other Ambulatory Visit: Payer: Self-pay | Admitting: Nurse Practitioner

## 2015-06-08 ENCOUNTER — Other Ambulatory Visit: Payer: Self-pay | Admitting: Nurse Practitioner

## 2016-03-26 IMAGING — DX DG CHEST 1V PORT
1 series · 1 of 1 positions shown · non-contrast
Comparison: 02/10/2015

CLINICAL DATA: Acute respiratory failure

EXAM:
PORTABLE CHEST 1 VIEW

[chest ap]
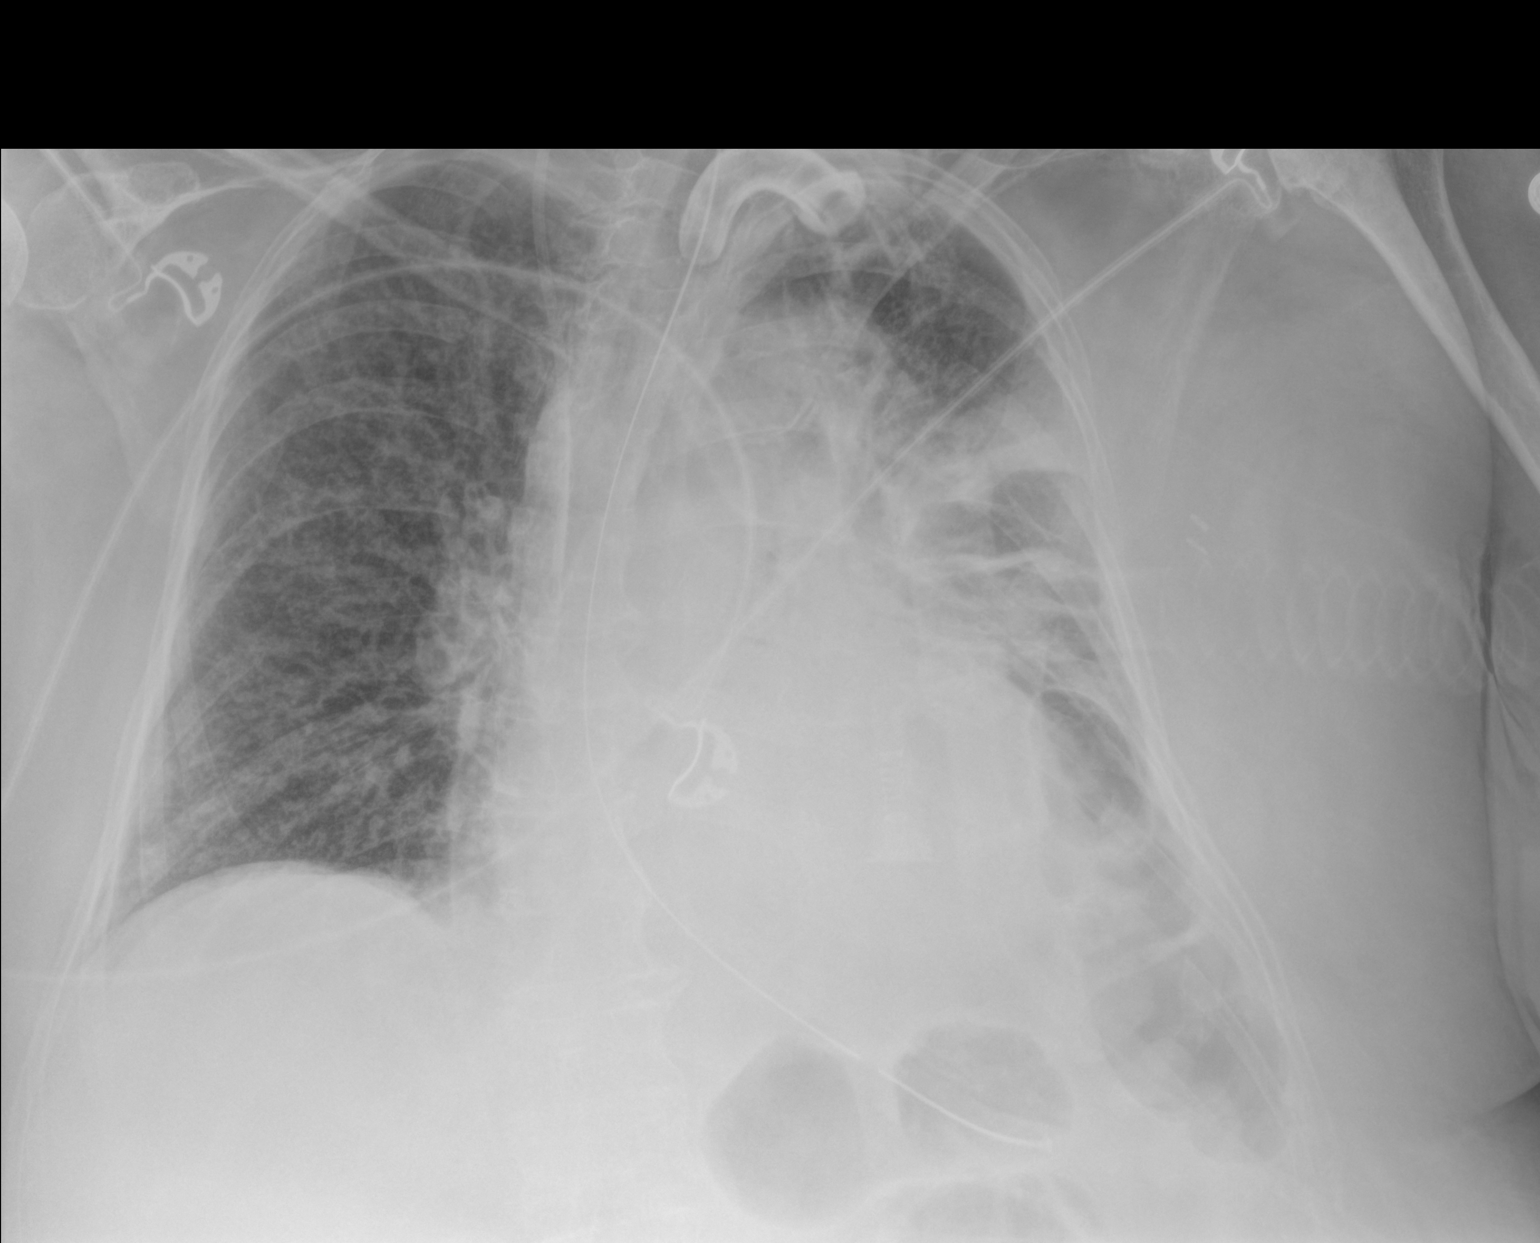

[1 of 1 positions shown; findings below may reference images not displayed]

FINDINGS: Tracheostomy in good position. NG tube in the proximal stomach and
unchanged

Progression of left lower lobe airspace disease. Elevated left
hemidiaphragm again noted. Possible small left pleural effusion.
Right lung remains clear
IMPRESSION: Progression of left lower lobe atelectasis/ infiltrate with elevated
left hemidiaphragm.

## 2016-03-27 IMAGING — DX DG ABD PORTABLE 1V
2 series · 2 of 2 positions shown · non-contrast
Comparison: 02/08/2015

CLINICAL DATA: Followup distended abdomen. Evaluate NG tube
placement.

EXAM:
PORTABLE ABDOMEN - 1 VIEW

[abdomen kub (1 of 2)]
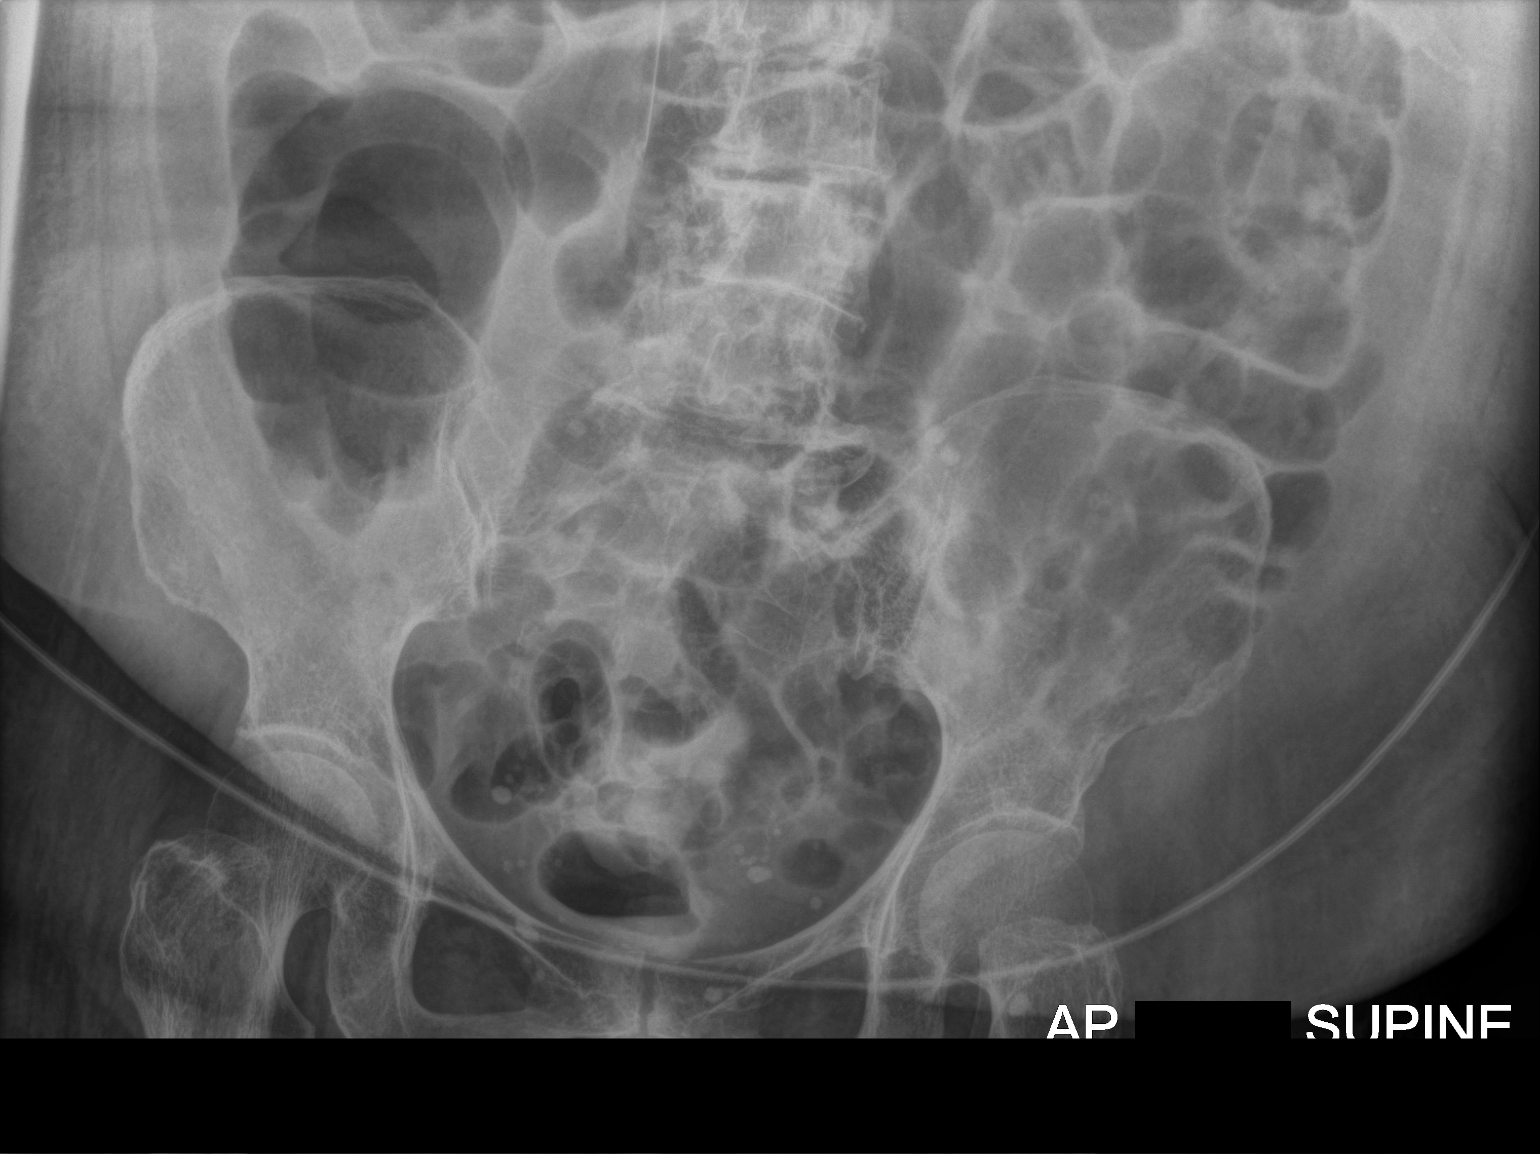

[abdomen kub (2 of 2)]
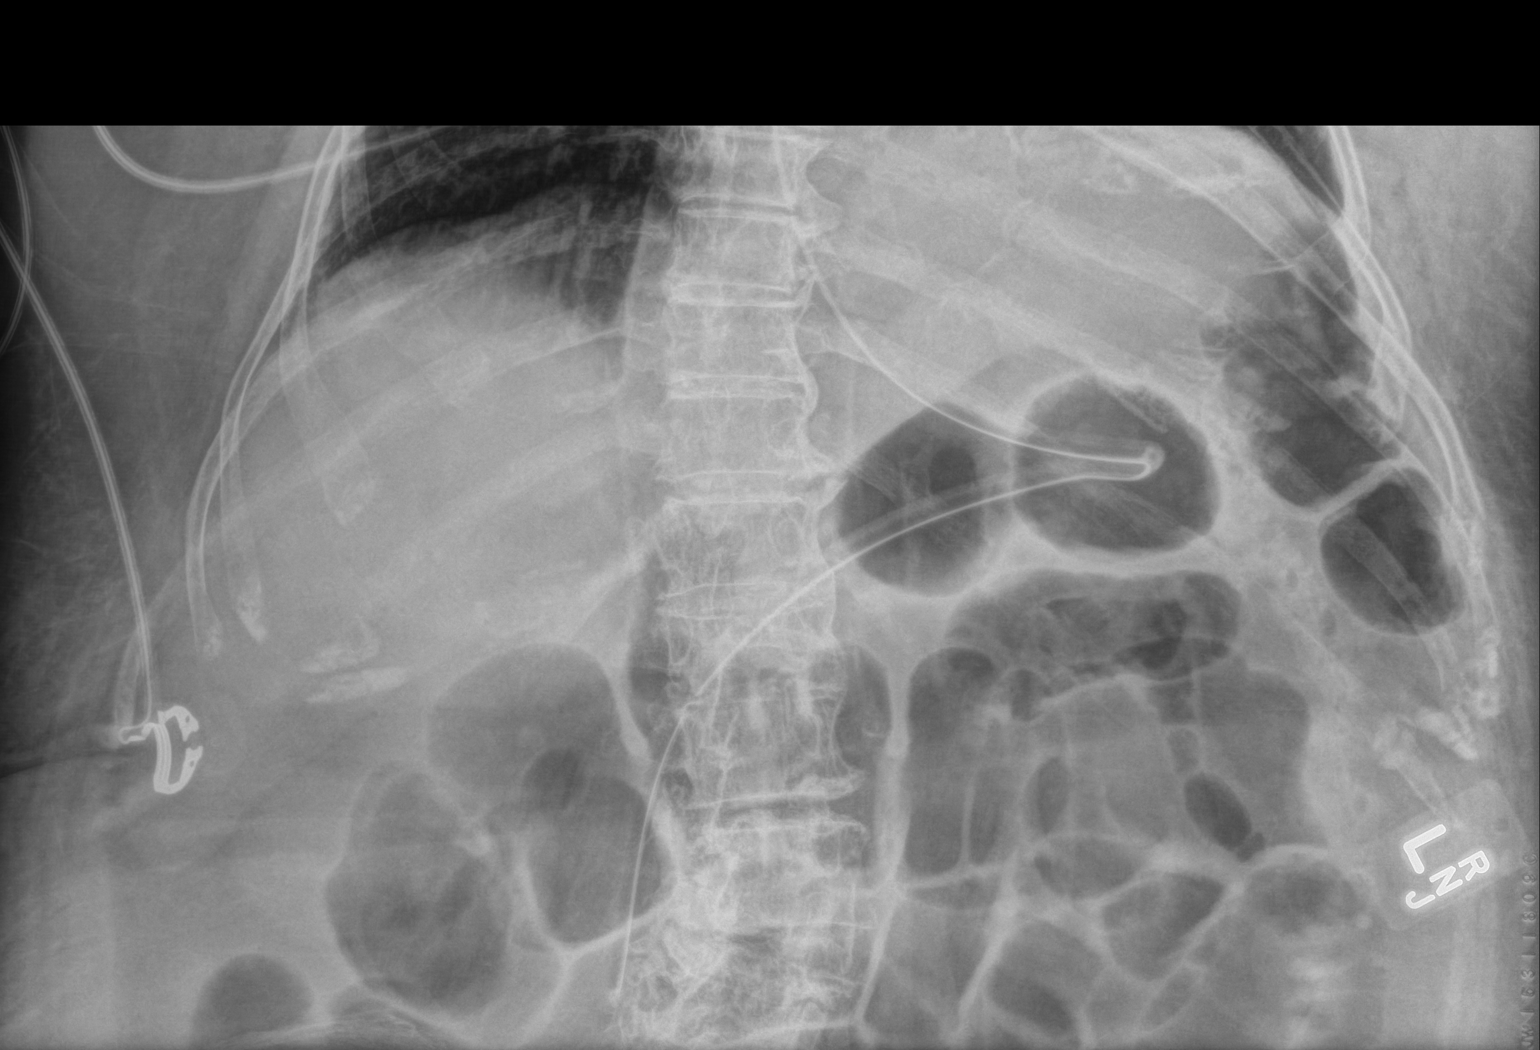

[2 of 2 positions shown; findings below may reference images not displayed]

FINDINGS: The nasogastric tube tip is in the expected location of the
duodenum. Gaseous distension of the large and small bowel loops
identified and appear increased from previous exam.
IMPRESSION: 1. NG tube tip is in the duodenum.
2. Persistent gaseous distension of bowel loops.

## 2016-03-29 IMAGING — US IR US GUIDE VASC ACCESS RIGHT
1 series · 2 of 2 positions shown · non-contrast
Comparison: none

CLINICAL DATA: SEPSIS, RESPIRATORY FAILURE

[Series 1: ir rad eval and mgt. · 2 of 2 slices shown]
[im 1/2]
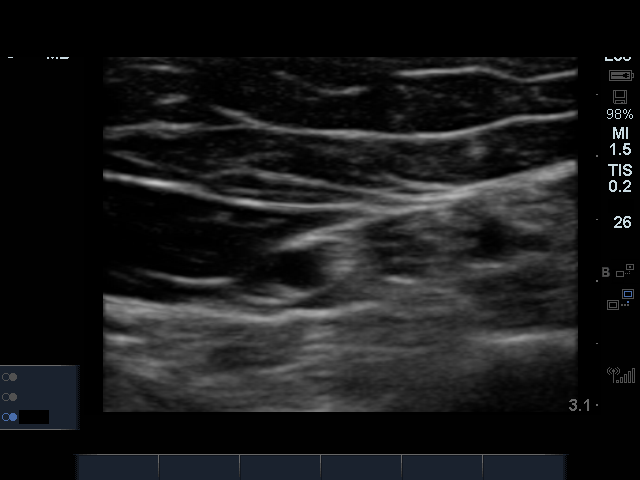
[im 2/2]
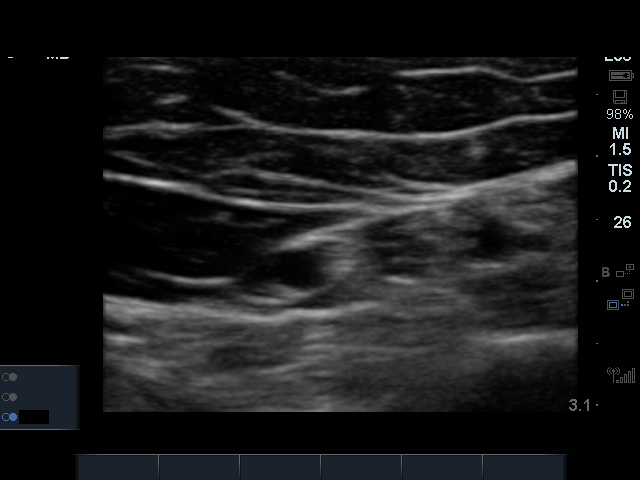

[2 of 2 positions shown; findings below may reference images not displayed]

EXAM:
RIGHT UPPER EXTREMITY TRIPLE LUMEN POWER PICC LINE PLACEMENT WITH
ULTRASOUND AND FLUOROSCOPIC GUIDANCE

FLUOROSCOPY TIME:  12 SECONDS

PROCEDURE:
The patient was advised of the possible risks andcomplications and
agreed to undergo the procedure. The patient was then brought to the
angiographic suite for the procedure.

The RIGHT arm was prepped with chlorhexidine, drapedin the usual
sterile fashion using maximum barrier technique (cap and mask,
sterile gown, sterile gloves, large sterile sheet, hand hygiene and
cutaneous antisepsis) and infiltrated locally with 1% Lidocaine.

Ultrasound demonstrated patency of the right basilic vein, and this
was documented with an image. Under real-time ultrasound guidance,
this vein was accessed with a 21 gauge micropuncture needle and
image documentation was performed. A [DATE] wire was introduced in to
the vein. Over this, a 6 French triple lumen power PICC was advanced
to the lower SVC/right atrial junction. Fluoroscopy during the
procedure and fluoro spot radiograph confirms appropriate catheter
position. The catheter was flushed and covered with asterile
dressing.

Catheter length: 42

Complications: None immediate
IMPRESSION: Successful right arm triple lumen power PICC line placement with
ultrasound and fluoroscopic guidance. The catheter is ready for use.

## 2022-12-04 ENCOUNTER — Telehealth: Payer: Self-pay | Admitting: Cardiology
